# Patient Record
Sex: Male | Born: 1940
Health system: Southern US, Community
[De-identification: ages and names within clinical notes are randomized; demographics above are authoritative.]

## PROBLEM LIST (undated history)

## (undated) DIAGNOSIS — E785 Hyperlipidemia, unspecified: Secondary | ICD-10-CM

## (undated) DIAGNOSIS — E669 Obesity, unspecified: Secondary | ICD-10-CM

## (undated) DIAGNOSIS — I10 Essential (primary) hypertension: Secondary | ICD-10-CM

## (undated) DIAGNOSIS — R001 Bradycardia, unspecified: Secondary | ICD-10-CM

## (undated) DIAGNOSIS — I119 Hypertensive heart disease without heart failure: Secondary | ICD-10-CM

## (undated) DIAGNOSIS — I2510 Atherosclerotic heart disease of native coronary artery without angina pectoris: Secondary | ICD-10-CM

## (undated) HISTORY — PX: CORONARY ARTERY BYPASS GRAFT: SHX141

## (undated) HISTORY — DX: Hyperlipidemia, unspecified: E78.5

## (undated) HISTORY — PX: VASECTOMY: SHX75

## (undated) HISTORY — DX: Essential (primary) hypertension: I10

## (undated) HISTORY — DX: Atherosclerotic heart disease of native coronary artery without angina pectoris: I25.10

## (undated) HISTORY — DX: Bradycardia, unspecified: R00.1

## (undated) HISTORY — DX: Hypertensive heart disease without heart failure: I11.9

## (undated) HISTORY — DX: Obesity, unspecified: E66.9

---

## 2000-10-31 ENCOUNTER — Inpatient Hospital Stay (HOSPITAL_COMMUNITY): Admission: RE | Admit: 2000-10-31 | Discharge: 2000-11-09 | Payer: Self-pay | Admitting: Cardiology

## 2000-11-01 ENCOUNTER — Encounter: Payer: Self-pay | Admitting: Cardiothoracic Surgery

## 2000-11-03 ENCOUNTER — Encounter: Payer: Self-pay | Admitting: Cardiothoracic Surgery

## 2000-11-04 ENCOUNTER — Encounter: Payer: Self-pay | Admitting: Cardiothoracic Surgery

## 2000-11-05 ENCOUNTER — Encounter: Payer: Self-pay | Admitting: Cardiothoracic Surgery

## 2012-11-21 ENCOUNTER — Encounter: Payer: Self-pay | Admitting: *Deleted

## 2012-11-21 ENCOUNTER — Encounter: Payer: Self-pay | Admitting: Cardiology

## 2012-11-26 ENCOUNTER — Ambulatory Visit (INDEPENDENT_AMBULATORY_CARE_PROVIDER_SITE_OTHER): Payer: Medicare Other | Admitting: Cardiology

## 2012-11-26 ENCOUNTER — Encounter: Payer: Self-pay | Admitting: Cardiology

## 2012-11-26 VITALS — BP 120/84 | HR 56 | Ht 69.0 in | Wt 210.0 lb

## 2012-11-26 DIAGNOSIS — I2510 Atherosclerotic heart disease of native coronary artery without angina pectoris: Secondary | ICD-10-CM | POA: Insufficient documentation

## 2012-11-26 DIAGNOSIS — E669 Obesity, unspecified: Secondary | ICD-10-CM

## 2012-11-26 DIAGNOSIS — I10 Essential (primary) hypertension: Secondary | ICD-10-CM

## 2012-11-26 DIAGNOSIS — E785 Hyperlipidemia, unspecified: Secondary | ICD-10-CM | POA: Insufficient documentation

## 2012-11-26 HISTORY — DX: Obesity, unspecified: E66.9

## 2012-11-26 HISTORY — DX: Atherosclerotic heart disease of native coronary artery without angina pectoris: I25.10

## 2012-11-26 LAB — BASIC METABOLIC PANEL
BUN: 16 mg/dL (ref 6–23)
CO2: 31 mEq/L (ref 19–32)
Chloride: 106 mEq/L (ref 96–112)
Creatinine, Ser: 1.3 mg/dL (ref 0.4–1.5)
GFR: 60.03 mL/min (ref >60.00)
Potassium: 4.6 mEq/L (ref 3.5–5.1)

## 2012-11-26 LAB — LIPID PANEL
Cholesterol: 104 mg/dL (ref 0–200)
HDL: 37.7 mg/dL — ABNORMAL LOW (ref >39.00)
LDL Cholesterol: 53 mg/dL (ref 0–99)
Total CHOL/HDL Ratio: 3
Triglycerides: 66 mg/dL (ref 0.0–149.0)
VLDL: 13.2 mg/dL (ref 0.0–40.0)

## 2012-11-26 MED ORDER — HYDROCHLOROTHIAZIDE 25 MG PO TABS: 25.0000 mg | ORAL_TABLET | Freq: Every day | ORAL | Status: DC

## 2012-11-26 MED ORDER — METOPROLOL SUCCINATE ER 50 MG PO TB24: 50.0000 mg | ORAL_TABLET | Freq: Every day | ORAL | Status: DC

## 2012-11-26 MED ORDER — AMLODIPINE BESYLATE 5 MG PO TABS: 5.0000 mg | ORAL_TABLET | Freq: Every day | ORAL | Status: DC

## 2012-11-26 MED ORDER — ROSUVASTATIN CALCIUM 20 MG PO TABS: 20.0000 mg | ORAL_TABLET | Freq: Every day | ORAL | Status: DC

## 2012-11-26 NOTE — Progress Notes (Addendum)
  [address] [place], [place]  [address] Phone: [phone] Fax:  [phone]  Date:  11/26/2012   ID:  Jeffery Barr, DOB Jun 26, 1940, MRN [id number]  PCP:  No primary provider on file.  Cardiologist:  Armanda Magic, MD    History of Present Illness: Jeffery Barr is a 72 y.o. male who presents today for followup of his CAD/HTN and lipids.  He is doing well.  Since I saw him last he fell off a ladder and fractured some ribs.  He denies any chest pain, SOB, DOE, LE edema, dizziness, palpitations or syncope.   Wt Readings from Last 3 Encounters:  11/26/12 210 lb (95.255 kg)     Past Medical History  Diagnosis Date  . Hypercholesteremia   . HTN (hypertension)   . Benign hypertensive heart disease without heart failure   . Obesity   . Dyslipidemia   . Coronary atherosclerosis of native coronary artery     S/P CABG    Current Outpatient Prescriptions  Medication Sig Dispense Refill  . amLODipine (NORVASC) 5 MG tablet Take 5 mg by mouth daily.      Marland Kitchen aspirin 81 MG tablet Take 81 mg by mouth daily.      . hydrochlorothiazide (HYDRODIURIL) 25 MG tablet Take 25 mg by mouth daily.      . metoprolol succinate (TOPROL-XL) 50 MG 24 hr tablet Take 50 mg by mouth daily. Take with or immediately following a meal.      . rosuvastatin (CRESTOR) 40 MG tablet Take 1/2 of 40mg  tablet  by mouth daily.       No current facility-administered medications for this visit.    Allergies:   No Known Allergies  Social History:  The patient  reports that he has quit smoking. He does not have any smokeless tobacco history on file. He reports that he drinks about 0.6 ounces of alcohol per week. He reports that he does not use illicit drugs.   Family History:  The patient's family history includes CAD in an other family member; Heart attack in an other family member; Heart disease in his brother; Heart failure in his mother.   ROS:  Please see the history of present illness.      All  other systems reviewed and negative.   PHYSICAL EXAM: VS:  BP 120/84  Pulse 56  Ht 5\' 9"  (1.753 m)  Wt 210 lb (95.255 kg)  BMI 31 kg/m2 Well nourished, well developed, in no acute distress HEENT: normal Neck: no JVD Cardiac:  normal S1, S2; RRR; no murmur Lungs:  clear to auscultation bilaterally, no wheezing, rhonchi or rales Abd: soft, nontender, no hepatomegaly Ext: no edema Skin: warm and dry Neuro:  CNs 2-12 intact, no focal abnormalities noted  EKG:  NSR with nonspecific ST abnormality no change compared to EKG 2012     ASSESSMENT AND PLAN:  1. CAD with no angina  -  continue ASA/beta blocker 2.  Dyslipidemia  - continue Crestor  - check lipids/ALT today 3.  HTN - controlled  - continue Toprol/HCTZ/Amlodipine  - check BMET today  Followup with me in 1 year igned, Armanda Magic, MD 11/26/2012 9:38 AM

## 2012-11-26 NOTE — Patient Instructions (Signed)
Your physician recommends that you continue on your current medications as directed. Please refer to the Current Medication list given to you today.  Your physician recommends that you schedule a follow-up appointment in: One year for F/U with Dr. Mayford Knife.  Please go to the lab today for Lipid, ALT, and BMET

## 2013-03-01 ENCOUNTER — Other Ambulatory Visit: Payer: Self-pay | Admitting: Cardiology

## 2013-03-03 ENCOUNTER — Other Ambulatory Visit: Payer: Self-pay | Admitting: *Deleted

## 2013-03-03 MED ORDER — ROSUVASTATIN CALCIUM 20 MG PO TABS
20.0000 mg | ORAL_TABLET | Freq: Every day | ORAL | Status: DC
Start: 2013-03-03 — End: 2013-11-22

## 2013-08-16 ENCOUNTER — Encounter: Payer: Self-pay | Admitting: *Deleted

## 2013-08-25 ENCOUNTER — Other Ambulatory Visit: Payer: Self-pay | Admitting: Cardiology

## 2013-08-30 ENCOUNTER — Other Ambulatory Visit: Payer: Self-pay | Admitting: Cardiology

## 2013-11-22 ENCOUNTER — Ambulatory Visit (INDEPENDENT_AMBULATORY_CARE_PROVIDER_SITE_OTHER): Payer: Medicare Other | Admitting: Cardiology

## 2013-11-22 ENCOUNTER — Encounter: Payer: Self-pay | Admitting: Cardiology

## 2013-11-22 ENCOUNTER — Telehealth: Payer: Self-pay | Admitting: Pharmacist

## 2013-11-22 VITALS — BP 138/70 | HR 56 | Ht 69.0 in | Wt 215.0 lb

## 2013-11-22 DIAGNOSIS — I2510 Atherosclerotic heart disease of native coronary artery without angina pectoris: Secondary | ICD-10-CM | POA: Diagnosis not present

## 2013-11-22 DIAGNOSIS — E785 Hyperlipidemia, unspecified: Secondary | ICD-10-CM | POA: Diagnosis not present

## 2013-11-22 DIAGNOSIS — I10 Essential (primary) hypertension: Secondary | ICD-10-CM | POA: Diagnosis not present

## 2013-11-22 LAB — BASIC METABOLIC PANEL
BUN: 19 mg/dL (ref 6–23)
CO2: 27 mEq/L (ref 19–32)
Calcium: 9.2 mg/dL (ref 8.4–10.5)
Chloride: 103 mEq/L (ref 96–112)
Creatinine, Ser: 1.1 mg/dL (ref 0.4–1.5)
GFR: 67.19 mL/min (ref >60.00)
Glucose, Bld: 86 mg/dL (ref 70–99)
Potassium: 3.8 mEq/L (ref 3.5–5.1)
SODIUM: 137 meq/L (ref 135–145)

## 2013-11-22 LAB — LIPID PANEL
Cholesterol: 226 mg/dL — ABNORMAL HIGH (ref 0–200)
HDL: 42.4 mg/dL (ref >39.00)
LDL Cholesterol: 157 mg/dL — ABNORMAL HIGH (ref 0–99)
NonHDL: 183.6
TRIGLYCERIDES: 135 mg/dL (ref 0.0–149.0)
Total CHOL/HDL Ratio: 5
VLDL: 27 mg/dL (ref 0.0–40.0)

## 2013-11-22 LAB — ALT: ALT: 22 U/L (ref 0–53)

## 2013-11-22 MED ORDER — HYDROCHLOROTHIAZIDE 25 MG PO TABS: 25.0000 mg | ORAL_TABLET | Freq: Every day | ORAL | Status: DC

## 2013-11-22 MED ORDER — ROSUVASTATIN CALCIUM 20 MG PO TABS: 20.0000 mg | ORAL_TABLET | Freq: Every day | ORAL | Status: DC

## 2013-11-22 MED ORDER — METOPROLOL SUCCINATE ER 50 MG PO TB24: 50.0000 mg | ORAL_TABLET | Freq: Every day | ORAL | Status: DC

## 2013-11-22 MED ORDER — AMLODIPINE BESYLATE 5 MG PO TABS: ORAL_TABLET | ORAL | Status: DC

## 2013-11-22 NOTE — Telephone Encounter (Signed)
Spoke with pt's wife.  Had given her instructions to increase pt's Crestor to 40mg  once daily.  After talking with him, he let her know he had been out of his crestor for several days prior to getting his labs checked.  Will have him restart with just 20mg  daily as his LDL was <70 on this dose last year.  Will plan to recheck in 3 months to verify dose.

## 2013-11-22 NOTE — Patient Instructions (Signed)
Your physician recommends that you return for a FASTING lipid, alt and bmet today.  Your physician recommends that you continue on your current medications as directed. Please refer to the Current Medication list given to you today.  Your physician wants you to follow-up in: 1 year with Dr. Mayford Knifeurner. You will receive a reminder letter in the mail two months in advance. If you don't receive a letter, please call our office to schedule the follow-up appointment.

## 2013-11-22 NOTE — Progress Notes (Signed)
  [address] [place], [place]  [address] Phone: [phone] Fax:  [phone]  Date:  11/22/2013   ID:  Jeffery Barr, DOB 04/07/1940, MRN [id number]  PCP:  No primary provider on file.  Cardiologist:  Armanda Magicraci Lunah Losasso, MD    History of Present Illness: Jeffery PaymentDavid T Sanko is a 73 y.o. male who presents today for followup of his CAD/HTN and lipids. He is doing well.  He denies any chest pain, SOB, DOE, LE edema, dizziness, palpitations or syncope.  He joined a gym and works out when he can.   Wt Readings from Last 3 Encounters:  11/22/13 215 lb (97.523 kg)  11/26/12 210 lb (95.255 kg)     Past Medical History  Diagnosis Date  . Hypercholesteremia   . HTN (hypertension)   . Benign hypertensive heart disease without heart failure   . Obesity   . Dyslipidemia   . Coronary atherosclerosis of native coronary artery     S/P CABG    Current Outpatient Prescriptions  Medication Sig Dispense Refill  . amLODipine (NORVASC) 5 MG tablet TAKE 1 TABLET BY MOUTH EVERY DAY  90 tablet  0  . aspirin 81 MG tablet Take 81 mg by mouth daily.      . hydrochlorothiazide (HYDRODIURIL) 25 MG tablet Take 1 tablet (25 mg total) by mouth daily.  90 tablet  3  . metoprolol succinate (TOPROL-XL) 50 MG 24 hr tablet Take 1 tablet (50 mg total) by mouth daily. Take with or immediately following a meal.  90 tablet  3  . rosuvastatin (CRESTOR) 20 MG tablet Take 1 tablet (20 mg total) by mouth daily.  90 tablet  3   No current facility-administered medications for this visit.    Allergies:   No Known Allergies  Social History:  The patient  reports that he has quit smoking. He does not have any smokeless tobacco history on file. He reports that he drinks about .6 ounces of alcohol per week. He reports that he does not use illicit drugs.   Family History:  The patient's family history includes CAD in his mother, sister, and another family member; Heart attack in an other family member; Heart disease  in his brother; Heart failure in his mother.   ROS:  Please see the history of present illness.      All other systems reviewed and negative.   PHYSICAL EXAM: VS:  BP 138/70  Pulse 56  Ht 5\' 9"  (1.753 m)  Wt 215 lb (97.523 kg)  BMI 31.74 kg/m2 Well nourished, well developed, in no acute distress HEENT: normal Neck: no JVD Cardiac:  normal S1, S2; RRR; no murmur Lungs:  clear to auscultation bilaterally, no wheezing, rhonchi or rales Abd: soft, nontender, no hepatomegaly Ext: no edema Skin: warm and dry Neuro:  CNs 2-12 intact, no focal abnormalities noted  EKG:     Sinus bradycardia at 56bpm with no ST changes  ASSESSMENT AND PLAN:  1.  CAD with no angina - continue ASA/beta blocker  2. Dyslipidemia  - continue Crestor  - check lipids/ALT today  3. HTN - controlled  - continue Toprol/HCTZ/Amlodipine  - check BMET today   Followup with me in 1 year    Signed, Armanda Magicraci Nyisha Clippard, MD [facility] 11/22/2013 10:37 AM

## 2013-11-23 MED ORDER — ROSUVASTATIN CALCIUM 20 MG PO TABS: 40.0000 mg | ORAL_TABLET | Freq: Every day | ORAL | Status: DC

## 2013-11-23 NOTE — Addendum Note (Signed)
Addended byOrlene Plum: Adam Sanjuan H on: 11/23/2013 09:58 AM   Modules accepted: Orders

## 2013-11-26 ENCOUNTER — Ambulatory Visit: Payer: Medicare Other | Admitting: Cardiology

## 2013-11-29 ENCOUNTER — Other Ambulatory Visit: Payer: Self-pay

## 2013-11-29 MED ORDER — ROSUVASTATIN CALCIUM 20 MG PO TABS
40.0000 mg | ORAL_TABLET | Freq: Every day | ORAL | Status: DC
Start: 2013-11-29 — End: 2014-11-30

## 2014-09-21 ENCOUNTER — Encounter: Payer: Self-pay | Admitting: Cardiology

## 2014-10-20 ENCOUNTER — Encounter: Payer: Self-pay | Admitting: Cardiology

## 2014-11-23 ENCOUNTER — Other Ambulatory Visit: Payer: Medicare Other

## 2014-11-23 ENCOUNTER — Ambulatory Visit: Payer: Medicare Other | Admitting: Cardiology

## 2014-11-30 ENCOUNTER — Telehealth: Payer: Self-pay

## 2014-11-30 ENCOUNTER — Ambulatory Visit (INDEPENDENT_AMBULATORY_CARE_PROVIDER_SITE_OTHER): Payer: Medicare Other | Admitting: Cardiology

## 2014-11-30 ENCOUNTER — Encounter: Payer: Self-pay | Admitting: Cardiology

## 2014-11-30 DIAGNOSIS — I10 Essential (primary) hypertension: Secondary | ICD-10-CM

## 2014-11-30 DIAGNOSIS — E785 Hyperlipidemia, unspecified: Secondary | ICD-10-CM

## 2014-11-30 DIAGNOSIS — I2510 Atherosclerotic heart disease of native coronary artery without angina pectoris: Secondary | ICD-10-CM | POA: Diagnosis not present

## 2014-11-30 DIAGNOSIS — I2583 Coronary atherosclerosis due to lipid rich plaque: Secondary | ICD-10-CM

## 2014-11-30 LAB — HEPATIC FUNCTION PANEL
ALBUMIN: 4.2 g/dL (ref 3.6–5.1)
ALK PHOS: 51 U/L (ref 40–115)
ALT: 25 U/L (ref 9–46)
AST: 23 U/L (ref 10–35)
BILIRUBIN TOTAL: 0.6 mg/dL (ref 0.2–1.2)
Bilirubin, Direct: 0.2 mg/dL (ref <=0.2)
Indirect Bilirubin: 0.4 mg/dL (ref 0.2–1.2)
TOTAL PROTEIN: 7.3 g/dL (ref 6.1–8.1)

## 2014-11-30 LAB — BASIC METABOLIC PANEL
BUN: 18 mg/dL (ref 7–25)
CHLORIDE: 103 mmol/L (ref 98–110)
CO2: 30 mmol/L (ref 20–31)
Calcium: 10 mg/dL (ref 8.6–10.3)
Creat: 1.21 mg/dL — ABNORMAL HIGH (ref 0.70–1.18)
GLUCOSE: 95 mg/dL (ref 65–99)
POTASSIUM: 4.3 mmol/L (ref 3.5–5.3)
SODIUM: 133 mmol/L — AB (ref 135–146)

## 2014-11-30 LAB — LIPID PANEL
CHOL/HDL RATIO: 3.6 ratio (ref <=5.0)
Cholesterol: 114 mg/dL — ABNORMAL LOW (ref 125–200)
HDL: 32 mg/dL — ABNORMAL LOW (ref >=40)
LDL Cholesterol: 64 mg/dL (ref <130)
Triglycerides: 91 mg/dL (ref <150)
VLDL: 18 mg/dL (ref <30)

## 2014-11-30 MED ORDER — HYDROCHLOROTHIAZIDE 12.5 MG PO TABS: 12.5000 mg | ORAL_TABLET | Freq: Every day | ORAL | Status: DC

## 2014-11-30 MED ORDER — HYDROCHLOROTHIAZIDE 25 MG PO TABS: 25.0000 mg | ORAL_TABLET | Freq: Every day | ORAL | Status: DC

## 2014-11-30 MED ORDER — AMLODIPINE BESYLATE 5 MG PO TABS: 5.0000 mg | ORAL_TABLET | Freq: Every day | ORAL | Status: DC

## 2014-11-30 MED ORDER — METOPROLOL SUCCINATE ER 50 MG PO TB24: 50.0000 mg | ORAL_TABLET | Freq: Every day | ORAL | Status: DC

## 2014-11-30 MED ORDER — ROSUVASTATIN CALCIUM 40 MG PO TABS: 40.0000 mg | ORAL_TABLET | Freq: Every day | ORAL | Status: DC

## 2014-11-30 NOTE — Patient Instructions (Signed)
Medication Instructions:  Your physician recommends that you continue on your current medications as directed. Please refer to the Current Medication list given to you today.   Labwork: TODAY: BMET, LFTs, Lipids  Testing/Procedures: None  Follow-Up: Your physician wants you to follow-up in: 1 year with Dr. Turner. You will receive a reminder letter in the mail two months in advance. If you don't receive a letter, please call our office to schedule the follow-up appointment.   Any Other Special Instructions Will Be Listed Below (If Applicable).   

## 2014-11-30 NOTE — Telephone Encounter (Signed)
Informed patient of results and verbal understanding expressed.  Instructed patient to DECREASE HCTZ to 12.5 mg daily. Repeat BMET scheduled next Tuesday, October 18. Patient and wife agree with treatment plan.

## 2014-11-30 NOTE — Progress Notes (Signed)
Cardiology Office Note   Date:  11/30/2014   ID:  Jeffery Barr, DOB 05/01/1940, MRN [id number]  PCP:  Lenora BoysFRIED, ROBERT L, MD    Chief Complaint  Patient presents with  . Coronary Artery Disease  . Hypertension      History of Present Illness: Jeffery Barr is a 74 y.o. male who presents today for followup of his CAD/HTN and lipids. He is doing well. He denies any chest pain, SOB, DOE, LE edema, dizziness, palpitations or syncope. He works out at [employer] when he can.     Past Medical History  Diagnosis Date  . Hypercholesteremia   . HTN (hypertension)   . Benign hypertensive heart disease without heart failure   . Obesity   . Dyslipidemia   . Coronary atherosclerosis of native coronary artery     S/P CABG    Past Surgical History  Procedure Laterality Date  . Vasectomy    . Coronary artery bypass graft       Current Outpatient Prescriptions  Medication Sig Dispense Refill  . amLODipine (NORVASC) 5 MG tablet TAKE 1 TABLET BY MOUTH EVERY DAY 90 tablet 3  . aspirin 81 MG tablet Take 81 mg by mouth daily.    . hydrochlorothiazide (HYDRODIURIL) 25 MG tablet Take 1 tablet (25 mg total) by mouth daily. 90 tablet 3  . metoprolol succinate (TOPROL-XL) 50 MG 24 hr tablet Take 1 tablet (50 mg total) by mouth daily. Take with or immediately following a meal. 90 tablet 3  . rosuvastatin (CRESTOR) 20 MG tablet Take 2 tablets (40 mg total) by mouth daily. 90 tablet 3   No current facility-administered medications for this visit.    Allergies:   Review of patient's allergies indicates no known allergies.    Social History:  The patient  reports that he has quit smoking. He does not have any smokeless tobacco history on file. He reports that he drinks about 0.6 oz of alcohol per week. He reports that he does not use illicit drugs.   Family History:  The patient's family history includes CAD in his mother, sister, and another family member; Heart attack in  an other family member; Heart disease in his brother; Heart failure in his mother.    ROS:  Please see the history of present illness.   Otherwise, review of systems are positive for none.   All other systems are reviewed and negative.    PHYSICAL EXAM: VS:  BP 144/62 mmHg  Pulse 52  Ht 5\' 9"  (1.753 m)  Wt 207 lb 12.8 oz (94.257 kg)  BMI 30.67 kg/m2 , BMI Body mass index is 30.67 kg/(m^2). GEN: Well nourished, well developed, in no acute distress HEENT: normal Neck: no JVD, carotid bruits, or masses Cardiac: RRR; no murmurs, rubs, or gallops,no edema  Respiratory:  clear to auscultation bilaterally, normal work of breathing GI: soft, nontender, nondistended, + BS MS: no deformity or atrophy Skin: warm and dry, no rash Neuro:  Strength and sensation are intact Psych: euthymic mood, full affect   EKG:  EKG is ordered today. The ekg ordered today demonstrates Sinus bradycardia at 52bpm with nonspecific ST abnormality   Recent Labs: No results found for requested labs within last 365 days.    Lipid Panel    Component Value Date/Time   CHOL 226* 11/22/2013 1053   TRIG 135.0 11/22/2013 1053   HDL 42.40  11/22/2013 1053   CHOLHDL 5 11/22/2013 1053   VLDL 27.0 11/22/2013 1053   LDLCALC 157* 11/22/2013 1053      Wt Readings from Last 3 Encounters:  11/30/14 207 lb 12.8 oz (94.257 kg)  11/22/13 215 lb (97.523 kg)  11/26/12 210 lb (95.255 kg)      ASSESSMENT AND PLAN:  1. CAD with no angina - continue ASA/beta blocker  2. Dyslipidemia  - continue Crestor  - check lipids/ALT today  3. HTN - controlled  - continue Toprol/HCTZ/Amlodipine  - check BMET today     Current medicines are reviewed at length with the patient today.  The patient does not have concerns regarding medicines.  The following changes have been made:  no change  Labs/ tests ordered today: See above Assessment and Plan No orders of the defined types were placed in this encounter.      Disposition:   FU with me in 1 year  Signed, Quintella Reichert, MD  11/30/2014 8:59 AM    [facility] [address] [place], [place], [place]  [address] Phone: [phone]; Fax: [phone]

## 2014-11-30 NOTE — Telephone Encounter (Signed)
-----   Message from Quintella Reichertraci R Turner, MD sent at 11/30/2014  1:50 PM EDT ----- Lipids at goal.  Creatinine bumped and sodium slightly low.  Decrease HCTZ to 12.5mg  daily and recheck BMET in 1 week

## 2014-12-06 ENCOUNTER — Other Ambulatory Visit: Payer: Medicare Other

## 2015-04-13 DIAGNOSIS — M7022 Olecranon bursitis, left elbow: Secondary | ICD-10-CM | POA: Diagnosis not present

## 2015-11-19 ENCOUNTER — Other Ambulatory Visit: Payer: Self-pay | Admitting: Cardiology

## 2015-12-04 ENCOUNTER — Ambulatory Visit (INDEPENDENT_AMBULATORY_CARE_PROVIDER_SITE_OTHER): Payer: Medicare Other | Admitting: Cardiology

## 2015-12-04 ENCOUNTER — Encounter: Payer: Self-pay | Admitting: Cardiology

## 2015-12-04 VITALS — BP 140/82 | HR 55 | Ht 68.5 in | Wt 218.4 lb

## 2015-12-04 DIAGNOSIS — E785 Hyperlipidemia, unspecified: Secondary | ICD-10-CM

## 2015-12-04 DIAGNOSIS — I2510 Atherosclerotic heart disease of native coronary artery without angina pectoris: Secondary | ICD-10-CM

## 2015-12-04 DIAGNOSIS — I10 Essential (primary) hypertension: Secondary | ICD-10-CM

## 2015-12-04 LAB — HEPATIC FUNCTION PANEL
ALBUMIN: 4 g/dL (ref 3.6–5.1)
ALT: 21 U/L (ref 9–46)
AST: 20 U/L (ref 10–35)
Alkaline Phosphatase: 45 U/L (ref 40–115)
BILIRUBIN TOTAL: 0.6 mg/dL (ref 0.2–1.2)
Bilirubin, Direct: 0.2 mg/dL (ref <=0.2)
Indirect Bilirubin: 0.4 mg/dL (ref 0.2–1.2)
TOTAL PROTEIN: 6.5 g/dL (ref 6.1–8.1)

## 2015-12-04 LAB — LIPID PANEL
CHOL/HDL RATIO: 2.6 ratio (ref <=5.0)
CHOLESTEROL: 104 mg/dL — AB (ref 125–200)
HDL: 40 mg/dL (ref >=40)
LDL Cholesterol: 40 mg/dL (ref <130)
Triglycerides: 118 mg/dL (ref <150)
VLDL: 24 mg/dL (ref <30)

## 2015-12-04 LAB — BASIC METABOLIC PANEL
BUN: 17 mg/dL (ref 7–25)
CALCIUM: 9.1 mg/dL (ref 8.6–10.3)
CO2: 28 mmol/L (ref 20–31)
Chloride: 106 mmol/L (ref 98–110)
Creat: 1.16 mg/dL (ref 0.70–1.18)
GLUCOSE: 96 mg/dL (ref 65–99)
POTASSIUM: 4 mmol/L (ref 3.5–5.3)
Sodium: 143 mmol/L (ref 135–146)

## 2015-12-04 MED ORDER — AMLODIPINE BESYLATE 5 MG PO TABS: 5.0000 mg | ORAL_TABLET | Freq: Every day | ORAL | 3 refills | Status: DC

## 2015-12-04 MED ORDER — HYDROCHLOROTHIAZIDE 25 MG PO TABS: 25.0000 mg | ORAL_TABLET | Freq: Every day | ORAL | 3 refills | Status: DC

## 2015-12-04 MED ORDER — ROSUVASTATIN CALCIUM 40 MG PO TABS: 40.0000 mg | ORAL_TABLET | Freq: Every day | ORAL | 3 refills | Status: DC

## 2015-12-04 MED ORDER — METOPROLOL SUCCINATE ER 50 MG PO TB24: 50.0000 mg | ORAL_TABLET | Freq: Every day | ORAL | 3 refills | Status: DC

## 2015-12-04 NOTE — Progress Notes (Signed)
Cardiology Office Note    Date:  12/04/2015   ID:  Jeffery Barr, DOB Jun 08, 1940, MRN [id number]  PCP:  Lenora Boys, MD  Cardiologist:  Armanda Magic, MD   Chief Complaint  Patient presents with  . Coronary Artery Disease  . Hypertension  . Hyperlipidemia    History of Present Illness:  Jeffery Barr is a 75 y.o. male who presents today for followup of his CAD/HTN and lipids. He is doing well. He denies any chest pain, SOB, DOE, LE edema, dizziness, palpitations or syncope. He works out at [employer] when he can.    Past Medical History:  Diagnosis Date  . Benign hypertensive heart disease without heart failure   . Coronary atherosclerosis of native coronary artery    S/P CABG  . Dyslipidemia   . HTN (hypertension)   . Obesity     Past Surgical History:  Procedure Laterality Date  . CORONARY ARTERY BYPASS GRAFT    . VASECTOMY      Current Medications: Outpatient Medications Prior to Visit  Medication Sig Dispense Refill  . amLODipine (NORVASC) 5 MG tablet TAKE 1 TABLET (5 MG TOTAL) BY MOUTH DAILY. 90 tablet 0  . aspirin 81 MG tablet Take 81 mg by mouth daily.    . metoprolol succinate (TOPROL-XL) 50 MG 24 hr tablet TAKE 1 TABLET (50 MG TOTAL) BY MOUTH DAILY. TAKE WITH OR IMMEDIATELY FOLLOWING A MEAL. 90 tablet 0  . rosuvastatin (CRESTOR) 40 MG tablet TAKE 1 TABLET (40 MG TOTAL) BY MOUTH DAILY. 90 tablet 0  . hydrochlorothiazide (HYDRODIURIL) 12.5 MG tablet Take 1 tablet (12.5 mg total) by mouth daily. 90 tablet 3   No facility-administered medications prior to visit.      Allergies:   Review of patient's allergies indicates no known allergies.   Social History   Social History  . Marital status: Married    Spouse name: N/A  . Number of children: N/A  . Years of education: N/A   Social History Main Topics  . Smoking status: Former Games developer  . Smokeless tobacco: None  . Alcohol use 0.6 oz/week    1 - 2 Glasses of wine, 1 - 2 Cans of beer, 1 Shots of  liquor per week     Comment: 1-2 glasses of wine and beer weekly  . Drug use: No  . Sexual activity: Not Asked   Other Topics Concern  . None   Social History Narrative  . None     Family History:  The patient's family history includes CAD in his mother and sister; Heart disease in his brother; Heart failure in his mother.   ROS:   Please see the history of present illness.    ROS All other systems reviewed and are negative.  No flowsheet data found.     PHYSICAL EXAM:   VS:  BP 140/82   Pulse (!) 55   Ht 5' 8.5" (1.74 m)   Wt 218 lb 6.4 oz (99.1 kg)   BMI 32.72 kg/m    GEN: Well nourished, well developed, in no acute distress  HEENT: normal  Neck: no JVD, carotid bruits, or masses Cardiac: RRR; no murmurs, rubs, or gallops,no edema.  Intact distal pulses bilaterally.  Respiratory:  clear to auscultation bilaterally, normal work of breathing GI: soft, nontender, nondistended, + BS MS: no deformity or atrophy  Skin: warm and dry, no rash Neuro:  Alert and Oriented x 3, Strength and sensation are intact Psych: euthymic  mood, full affect  Wt Readings from Last 3 Encounters:  12/04/15 218 lb 6.4 oz (99.1 kg)  11/30/14 207 lb 12.8 oz (94.3 kg)  11/22/13 215 lb (97.5 kg)      Studies/Labs Reviewed:   EKG:  EKG is ordered today.  The ekg ordered today demonstrates sinus bradycardia at 54bpm with nonspecific ST abnormality  Recent Labs: No results found for requested labs within last 8760 hours.   Lipid Panel    Component Value Date/Time   CHOL 114 (L) 11/30/2014 0920   TRIG 91 11/30/2014 0920   HDL 32 (L) 11/30/2014 0920   CHOLHDL 3.6 11/30/2014 0920   VLDL 18 11/30/2014 0920   LDLCALC 64 11/30/2014 0920    Additional studies/ records that were reviewed today include:  none    ASSESSMENT:    1. Coronary artery disease involving native coronary artery of native heart without angina pectoris   2. Essential hypertension   3. Dyslipidemia      PLAN:   In order of problems listed above:  1. ASCAD with no anginal symptoms.  Continue ASA and statin.  2. HTN - BP controlled on current meds.  Continue amlodipine/diuretic and BB. 3. Hyperlipidemia with LDL goal < 70.  Continue statin.  Check FLP and ALT.     Medication Adjustments/Labs and Tests Ordered: Current medicines are reviewed at length with the patient today.  Concerns regarding medicines are outlined above.  Medication changes, Labs and Tests ordered today are listed in the Patient Instructions below.  There are no Patient Instructions on file for this visit.   Signed, Armanda Magicraci Wally Behan, MD  12/04/2015 10:08 AM    [facility] [address], [place], [place]  [address] Phone: [phone]([phone]; Fax: [phone]

## 2015-12-04 NOTE — Patient Instructions (Signed)

## 2015-12-06 ENCOUNTER — Telehealth: Payer: Self-pay | Admitting: Cardiology

## 2015-12-06 NOTE — Telephone Encounter (Signed)
New message   Pt verbalized that he is returning call for rn about his wife and lab results

## 2015-12-06 NOTE — Telephone Encounter (Signed)
Left message to call back  

## 2015-12-06 NOTE — Telephone Encounter (Signed)
Patient called in asking if his wife could pick up his medical records I made him aware she can as long as he signs a release in order for her to so.  He then asked me id this was the law I made him aware per [facility] and [facility] this was a requirement. He stated we have been releasing records against the law for 15 years. I apologized to patient numerous times and told him this has not been happening here since [facility] joined this practice. We will always have a signed release.   He then told me to ask [facility]Katy and Dr.Turner if they would release these records-[facility] and [facility] and she stated No release needs to be signed.   Called patient back made him aware I can E-mail,fax,mail or he can come to office and sign release but until it's signed I cannot release information to his wife.   I emailed release to [email or web address]

## 2015-12-11 NOTE — Telephone Encounter (Signed)
Notes Recorded by Henrietta DineKathryn A Kemp, RN on 12/07/2015 at 5:38 PM EDT Informed patient of results and verbal understanding expressed.  Also spoke with patient's wife and she understands results as well.  Patient's wife reports she sent DPR form via email to medical records today. Explained to her how to get MyChart so she can see all of the patient's results for her keeping.

## 2016-11-25 ENCOUNTER — Encounter: Payer: Self-pay | Admitting: Cardiology

## 2016-12-09 ENCOUNTER — Encounter: Payer: Self-pay | Admitting: Cardiology

## 2016-12-09 ENCOUNTER — Ambulatory Visit (INDEPENDENT_AMBULATORY_CARE_PROVIDER_SITE_OTHER): Payer: Medicare Other | Admitting: Cardiology

## 2016-12-09 VITALS — BP 138/70 | HR 49 | Ht 68.5 in | Wt 216.0 lb

## 2016-12-09 DIAGNOSIS — E785 Hyperlipidemia, unspecified: Secondary | ICD-10-CM | POA: Diagnosis not present

## 2016-12-09 DIAGNOSIS — I10 Essential (primary) hypertension: Secondary | ICD-10-CM | POA: Diagnosis not present

## 2016-12-09 DIAGNOSIS — I2510 Atherosclerotic heart disease of native coronary artery without angina pectoris: Secondary | ICD-10-CM | POA: Diagnosis not present

## 2016-12-09 LAB — HEPATIC FUNCTION PANEL
ALBUMIN: 4.6 g/dL (ref 3.5–4.8)
ALK PHOS: 56 IU/L (ref 39–117)
ALT: 19 IU/L (ref 0–44)
AST: 21 IU/L (ref 0–40)
Bilirubin Total: 0.5 mg/dL (ref 0.0–1.2)
Bilirubin, Direct: 0.19 mg/dL (ref 0.00–0.40)
TOTAL PROTEIN: 7.2 g/dL (ref 6.0–8.5)

## 2016-12-09 LAB — BASIC METABOLIC PANEL
BUN / CREAT RATIO: 12 (ref 10–24)
BUN: 15 mg/dL (ref 8–27)
CHLORIDE: 103 mmol/L (ref 96–106)
CO2: 24 mmol/L (ref 20–29)
Calcium: 9.9 mg/dL (ref 8.6–10.2)
Creatinine, Ser: 1.21 mg/dL (ref 0.76–1.27)
GFR calc Af Amer: 68 mL/min/{1.73_m2} (ref >59)
GFR calc non Af Amer: 59 mL/min/{1.73_m2} — ABNORMAL LOW (ref >59)
GLUCOSE: 95 mg/dL (ref 65–99)
POTASSIUM: 4.8 mmol/L (ref 3.5–5.2)
SODIUM: 144 mmol/L (ref 134–144)

## 2016-12-09 LAB — LIPID PANEL
CHOL/HDL RATIO: 2.5 ratio (ref 0.0–5.0)
Cholesterol, Total: 106 mg/dL (ref 100–199)
HDL: 42 mg/dL (ref >39)
LDL Calculated: 39 mg/dL (ref 0–99)
Triglycerides: 123 mg/dL (ref 0–149)
VLDL CHOLESTEROL CAL: 25 mg/dL (ref 5–40)

## 2016-12-09 MED ORDER — HYDROCHLOROTHIAZIDE 25 MG PO TABS: 25.0000 mg | ORAL_TABLET | Freq: Every day | ORAL | 3 refills | Status: DC

## 2016-12-09 MED ORDER — AMLODIPINE BESYLATE 5 MG PO TABS: 5.0000 mg | ORAL_TABLET | Freq: Every day | ORAL | 3 refills | Status: DC

## 2016-12-09 MED ORDER — ROSUVASTATIN CALCIUM 40 MG PO TABS: 40.0000 mg | ORAL_TABLET | Freq: Every day | ORAL | 3 refills | Status: DC

## 2016-12-09 MED ORDER — METOPROLOL SUCCINATE ER 50 MG PO TB24: 50.0000 mg | ORAL_TABLET | Freq: Every day | ORAL | 3 refills | Status: DC

## 2016-12-09 NOTE — Patient Instructions (Signed)
Medication Instructions:  Your physician recommends that you continue on your current medications as directed. Please refer to the Current Medication list given to you today.   Labwork: Basic metabolic panel, fasting lipids, and liver function panel today   Testing/Procedures: None ordered   Follow-Up: Your physician wants you to follow-up in: 1 year with Dr. Sherlyn Lickurner  You will receive a reminder letter in the mail two months in advance. If you don't receive a letter, please call our office to schedule the follow-up appointment.   Any Other Special Instructions Will Be Listed Below (If Applicable).     If you need a refill on your cardiac medications before your next appointment, please call your pharmacy.

## 2016-12-09 NOTE — Progress Notes (Signed)
Cardiology Office Note:    Date:  12/09/2016   ID:  Jeffery Barr, DOB 03-18-1940, MRN [id number]  PCP:  [name], MD  Cardiologist:  Armanda Magic, MD   Referring MD: Marinda Elk, MD   Chief Complaint  Patient presents with  . Coronary Artery Disease  . Hypertension  . Hyperlipidemia    History of Present Illness:    Jeffery Barr is a 76 y.o. male with a hx of ASCAD s/p remote CABG, HTN and hyperlipidemia.  He is here today for followup and is doing well.  He denies any chest pain or pressure, SOB, DOE, PND, orthopnea, LE edema, dizziness, palpitations or syncope. He is compliant with his meds and is tolerating meds with no SE.    Past Medical History:  Diagnosis Date  . Benign hypertensive heart disease without heart failure   . Coronary atherosclerosis of native coronary artery    S/P CABG  . Dyslipidemia   . HTN (hypertension)   . Obesity     Past Surgical History:  Procedure Laterality Date  . CORONARY ARTERY BYPASS GRAFT    . VASECTOMY      Current Medications: Current Meds  Medication Sig  . amLODipine (NORVASC) 5 MG tablet Take 1 tablet (5 mg total) by mouth daily.  Marland Kitchen aspirin 81 MG tablet Take 81 mg by mouth daily.  . hydrochlorothiazide (HYDRODIURIL) 25 MG tablet Take 1 tablet (25 mg total) by mouth daily.  . metoprolol succinate (TOPROL-XL) 50 MG 24 hr tablet Take 1 tablet (50 mg total) by mouth daily. Take with or immediately following a meal.  . rosuvastatin (CRESTOR) 40 MG tablet Take 1 tablet (40 mg total) by mouth daily.     Allergies:   Patient has no known allergies.   Social History   Social History  . Marital status: Married    Spouse name: N/A  . Number of children: N/A  . Years of education: N/A   Social History Main Topics  . Smoking status: Former Games developer  . Smokeless tobacco: Never Used  . Alcohol use 0.6 oz/week    1 - 2 Glasses of wine, 1 - 2 Cans of beer, 1 Shots of liquor per week     Comment: 1-2 glasses of wine  and beer weekly  . Drug use: No  . Sexual activity: Not Asked   Other Topics Concern  . None   Social History Narrative  . None     Family History: The patient's family history includes CAD in his mother, sister, and unknown relative; Heart attack in his unknown relative; Heart disease in his brother; Heart failure in his mother.  ROS:   Please see the history of present illness.    ROS  All other systems reviewed and negative.   EKGs/Labs/Other Studies Reviewed:    The following studies were reviewed today: none  EKG:  EKG is ordered today.  The ekg ordered today demonstrates sinus bradycardia at 49 bpm with nonspecific ST abnormality  Recent Labs: No results found for requested labs within last 8760 hours.   Recent Lipid Panel    Component Value Date/Time   CHOL 104 (L) 12/04/2015 1022   TRIG 118 12/04/2015 1022   HDL 40 12/04/2015 1022   CHOLHDL 2.6 12/04/2015 1022   VLDL 24 12/04/2015 1022   LDLCALC 40 12/04/2015 1022    Physical Exam:    VS:  BP 138/70   Pulse (!) 49   Ht 5' 8.5" (1.74 m)  Wt 216 lb (98 kg)   BMI 32.36 kg/m     Wt Readings from Last 3 Encounters:  12/09/16 216 lb (98 kg)  12/04/15 218 lb 6.4 oz (99.1 kg)  11/30/14 207 lb 12.8 oz (94.3 kg)     GEN:  Well nourished, well developed in no acute distress HEENT: Normal NECK: No JVD; No carotid bruits LYMPHATICS: No lymphadenopathy CARDIAC: RRR, no murmurs, rubs, gallops RESPIRATORY:  Clear to auscultation without rales, wheezing or rhonchi  ABDOMEN: Soft, non-tender, non-distended MUSCULOSKELETAL:  No edema; No deformity  SKIN: Warm and dry NEUROLOGIC:  Alert and oriented x 3 PSYCHIATRIC:  Normal affect   ASSESSMENT:    1. Coronary artery disease involving native coronary artery of native heart without angina pectoris   2. Essential hypertension   3. Dyslipidemia    PLAN:    In order of problems listed above:  1.  ASCAD s/p remote CABG - he has no anginal symptoms.    2.   HTN - BP is well controlled on exam today.  He will continue on HCTZ 25mg  daily, amlodipine 5mg  daily and Toprol XL 50mg  daily.    3.  Hyperlipidemia with LDL goal < 70.  He will continue on Crestor 40mg  daily.  I will get an FLP and ALT today.    Medication Adjustments/Labs and Tests Ordered: Current medicines are reviewed at length with the patient today.  Concerns regarding medicines are outlined above.  No orders of the defined types were placed in this encounter.  No orders of the defined types were placed in this encounter.   Signed, Armanda Magicraci Jeral Zick, MD  12/09/2016 10:19 AM    [facility]

## 2017-05-15 DIAGNOSIS — H2513 Age-related nuclear cataract, bilateral: Secondary | ICD-10-CM | POA: Diagnosis not present

## 2017-05-15 DIAGNOSIS — H3562 Retinal hemorrhage, left eye: Secondary | ICD-10-CM | POA: Diagnosis not present

## 2017-12-11 ENCOUNTER — Encounter: Payer: Self-pay | Admitting: Cardiology

## 2017-12-11 ENCOUNTER — Ambulatory Visit (INDEPENDENT_AMBULATORY_CARE_PROVIDER_SITE_OTHER): Payer: Medicare Other | Admitting: Cardiology

## 2017-12-11 VITALS — BP 130/70 | HR 51 | Ht 68.5 in | Wt 224.0 lb

## 2017-12-11 DIAGNOSIS — E785 Hyperlipidemia, unspecified: Secondary | ICD-10-CM | POA: Diagnosis not present

## 2017-12-11 DIAGNOSIS — I10 Essential (primary) hypertension: Secondary | ICD-10-CM

## 2017-12-11 DIAGNOSIS — I2510 Atherosclerotic heart disease of native coronary artery without angina pectoris: Secondary | ICD-10-CM | POA: Diagnosis not present

## 2017-12-11 LAB — LIPID PANEL
CHOL/HDL RATIO: 2.4 ratio (ref 0.0–5.0)
Cholesterol, Total: 105 mg/dL (ref 100–199)
HDL: 43 mg/dL (ref >39)
LDL Calculated: 40 mg/dL (ref 0–99)
TRIGLYCERIDES: 109 mg/dL (ref 0–149)
VLDL Cholesterol Cal: 22 mg/dL (ref 5–40)

## 2017-12-11 LAB — BASIC METABOLIC PANEL
BUN/Creatinine Ratio: 14 (ref 10–24)
BUN: 16 mg/dL (ref 8–27)
CO2: 26 mmol/L (ref 20–29)
CREATININE: 1.15 mg/dL (ref 0.76–1.27)
Calcium: 9.7 mg/dL (ref 8.6–10.2)
Chloride: 102 mmol/L (ref 96–106)
GFR calc Af Amer: 72 mL/min/{1.73_m2} (ref >59)
GFR, EST NON AFRICAN AMERICAN: 62 mL/min/{1.73_m2} (ref >59)
Glucose: 90 mg/dL (ref 65–99)
Potassium: 4.2 mmol/L (ref 3.5–5.2)
SODIUM: 143 mmol/L (ref 134–144)

## 2017-12-11 LAB — HEPATIC FUNCTION PANEL
ALBUMIN: 4.3 g/dL (ref 3.5–4.8)
ALT: 19 IU/L (ref 0–44)
AST: 18 IU/L (ref 0–40)
Alkaline Phosphatase: 53 IU/L (ref 39–117)
BILIRUBIN TOTAL: 0.6 mg/dL (ref 0.0–1.2)
Bilirubin, Direct: 0.18 mg/dL (ref 0.00–0.40)
Total Protein: 6.9 g/dL (ref 6.0–8.5)

## 2017-12-11 MED ORDER — METOPROLOL SUCCINATE ER 50 MG PO TB24: 50.0000 mg | ORAL_TABLET | Freq: Every day | ORAL | 3 refills | Status: DC

## 2017-12-11 MED ORDER — AMLODIPINE BESYLATE 5 MG PO TABS: 5.0000 mg | ORAL_TABLET | Freq: Every day | ORAL | 3 refills | Status: DC

## 2017-12-11 MED ORDER — ATORVASTATIN CALCIUM 80 MG PO TABS: 80.0000 mg | ORAL_TABLET | Freq: Every day | ORAL | 3 refills | Status: DC

## 2017-12-11 MED ORDER — HYDROCHLOROTHIAZIDE 25 MG PO TABS: 25.0000 mg | ORAL_TABLET | Freq: Every day | ORAL | 3 refills | Status: DC

## 2017-12-11 NOTE — Progress Notes (Signed)
Cardiology Office Note:    Date:  12/11/2017   ID:  [name], DOB Jun 26, 1940, MRN [id number]  PCP:  [name], MD  Cardiologist:  No primary care provider on file.    Referring MD: [name], MD   Chief Complaint  Patient presents with  . Coronary Artery Disease  . Hypertension  . Hyperlipidemia    History of Present Illness:    Jeffery Barr is a 77 y.o. male with a hx of ASCAD s/p remote CABG, HTN and hyperlipidemia.  She is here today for followup and is doing well.  She denies any chest pain or pressure, SOB, DOE, PND, orthopnea, LE edema, dizziness, palpitations or syncope. She is compliant with her meds and is tolerating meds with no SE.    Past Medical History:  Diagnosis Date  . Benign hypertensive heart disease without heart failure   . Coronary atherosclerosis of native coronary artery    S/P CABG  . Dyslipidemia   . HTN (hypertension)   . Obesity     Past Surgical History:  Procedure Laterality Date  . CORONARY ARTERY BYPASS GRAFT    . VASECTOMY      Current Medications: Current Meds  Medication Sig  . amLODipine (NORVASC) 5 MG tablet Take 1 tablet (5 mg total) by mouth daily.  Marland Kitchen aspirin 81 MG tablet Take 81 mg by mouth daily.  . hydrochlorothiazide (HYDRODIURIL) 25 MG tablet Take 1 tablet (25 mg total) by mouth daily.  . metoprolol succinate (TOPROL-XL) 50 MG 24 hr tablet Take 1 tablet (50 mg total) by mouth daily. Take with or immediately following a meal.  . rosuvastatin (CRESTOR) 40 MG tablet Take 1 tablet (40 mg total) by mouth daily.     Allergies:   Patient has no known allergies.   Social History   Socioeconomic History  . Marital status: Married    Spouse name: Not on file  . Number of children: Not on file  . Years of education: Not on file  . Highest education level: Not on file  Occupational History  . Not on file  Social Needs  . Financial resource strain: Not on file  . Food insecurity:    Worry: Not on file     Inability: Not on file  . Transportation needs:    Medical: Not on file    Non-medical: Not on file  Tobacco Use  . Smoking status: Former Games developer  . Smokeless tobacco: Never Used  Substance and Sexual Activity  . Alcohol use: Yes    Alcohol/week: 3.0 - 5.0 standard drinks    Types: 1 - 2 Glasses of wine, 1 - 2 Cans of beer, 1 Shots of liquor per week    Comment: 1-2 glasses of wine and beer weekly  . Drug use: No  . Sexual activity: Not on file  Lifestyle  . Physical activity:    Days per week: Not on file    Minutes per session: Not on file  . Stress: Not on file  Relationships  . Social connections:    Talks on phone: Not on file    Gets together: Not on file    Attends religious service: Not on file    Active member of club or organization: Not on file    Attends meetings of clubs or organizations: Not on file    Relationship status: Not on file  Other Topics Concern  . Not on file  Social History Narrative  . Not on  file     Family History: The patient's family history includes CAD in his mother, sister, and unknown relative; Heart attack in his unknown relative; Heart disease in his brother; Heart failure in his mother.  ROS:   Please see the history of present illness.    ROS  All other systems reviewed and negative.   EKGs/Labs/Other Studies Reviewed:    The following studies were reviewed today: none  EKG:  EKG is ordered today.  The ekg ordered today demonstrates sinus bradycardia at 51bpm with inferior ST/T wave abnormality  Recent Labs: No results found for requested labs within last 8760 hours.   Recent Lipid Panel    Component Value Date/Time   CHOL 106 12/09/2016 1034   TRIG 123 12/09/2016 1034   HDL 42 12/09/2016 1034   CHOLHDL 2.5 12/09/2016 1034   CHOLHDL 2.6 12/04/2015 1022   VLDL 24 12/04/2015 1022   LDLCALC 39 12/09/2016 1034    Physical Exam:    VS:  BP 130/70   Pulse (!) 51   Ht 5' 8.5" (1.74 m)   Wt 224 lb (101.6 kg)    BMI 33.56 kg/m     Wt Readings from Last 3 Encounters:  12/11/17 224 lb (101.6 kg)  12/09/16 216 lb (98 kg)  12/04/15 218 lb 6.4 oz (99.1 kg)     GEN:  Well nourished, well developed in no acute distress HEENT: Normal NECK: No JVD; No carotid bruits LYMPHATICS: No lymphadenopathy CARDIAC: RRR, no murmurs, rubs, gallops RESPIRATORY:  Clear to auscultation without rales, wheezing or rhonchi  ABDOMEN: Soft, non-tender, non-distended MUSCULOSKELETAL:  No edema; No deformity  SKIN: Warm and dry NEUROLOGIC:  Alert and oriented x 3 PSYCHIATRIC:  Normal affect   ASSESSMENT:    1. Coronary artery disease involving native coronary artery of native heart without angina pectoris   2. Essential hypertension   3. Dyslipidemia    PLAN:    In order of problems listed above:  1.  ASCAD - s/p remote CABG.  He denies any anginal symptoms today.  He will continue on aspirin 81 mg daily, beta-blocker and statin.  2.  HTN -BP is well controlled on exam today.  He will continue on amlodipine 5 mg daily, HCTZ 25 mg daily and Toprol XL 50 mg daily.  3.  Hyperlipidemia -LDL goal is less than 70.  His LDL was 39 on 12/09/2016.  I will repeat an FLP and ALT.   Medication Adjustments/Labs and Tests Ordered: Current medicines are reviewed at length with the patient today.  Concerns regarding medicines are outlined above.  Orders Placed This Encounter  Procedures  . EKG 12-Lead   No orders of the defined types were placed in this encounter.   Signed, Armanda Magic, MD  12/11/2017 9:51 AM    [facility]

## 2017-12-11 NOTE — Patient Instructions (Signed)
Medication Instructions:  Start: Lipitor 80 mg, by mouth, daily If you need a refill on your cardiac medications before your next appointment, please call your pharmacy.   Lab work: Today: BMET, LFT and Lipids  If you have labs (blood work) drawn today and your tests are completely normal, you will receive your results only by: Marland Kitchen MyChart Message (if you have MyChart) OR . A paper copy in the mail If you have any lab test that is abnormal or we need to change your treatment, we will call you to review the results.  Follow-Up: At [facility], you and your health needs are our priority.  As part of our continuing mission to provide you with exceptional heart care, we have created designated Provider Care Teams.  These Care Teams include your primary Cardiologist (physician) and Advanced Practice Providers (APPs -  Physician Assistants and Nurse Practitioners) who all work together to provide you with the care you need, when you need it. You will need a follow up appointment in 1 years.  Please call our office 2 months in advance to schedule this appointment.  You may see Dr. Mayford Knife or one of the following Advanced Practice Providers on your designated Care Team:   [place], PA-C Ronie Spies, PA-C . Jacolyn Reedy, PA-C

## 2018-01-01 DIAGNOSIS — I2510 Atherosclerotic heart disease of native coronary artery without angina pectoris: Secondary | ICD-10-CM | POA: Diagnosis not present

## 2018-01-01 DIAGNOSIS — E7800 Pure hypercholesterolemia, unspecified: Secondary | ICD-10-CM | POA: Diagnosis not present

## 2018-01-01 DIAGNOSIS — Z0000 Encounter for general adult medical examination without abnormal findings: Secondary | ICD-10-CM | POA: Diagnosis not present

## 2018-01-01 DIAGNOSIS — I10 Essential (primary) hypertension: Secondary | ICD-10-CM | POA: Diagnosis not present

## 2018-01-01 DIAGNOSIS — M25569 Pain in unspecified knee: Secondary | ICD-10-CM | POA: Diagnosis not present

## 2018-01-27 DIAGNOSIS — M25562 Pain in left knee: Secondary | ICD-10-CM | POA: Diagnosis not present

## 2018-02-04 DIAGNOSIS — M25562 Pain in left knee: Secondary | ICD-10-CM | POA: Diagnosis not present

## 2018-03-04 DIAGNOSIS — S83242D Other tear of medial meniscus, current injury, left knee, subsequent encounter: Secondary | ICD-10-CM | POA: Diagnosis not present

## 2018-03-17 DIAGNOSIS — Y999 Unspecified external cause status: Secondary | ICD-10-CM | POA: Diagnosis not present

## 2018-03-17 DIAGNOSIS — S83232A Complex tear of medial meniscus, current injury, left knee, initial encounter: Secondary | ICD-10-CM | POA: Diagnosis not present

## 2018-03-17 DIAGNOSIS — M94262 Chondromalacia, left knee: Secondary | ICD-10-CM | POA: Diagnosis not present

## 2018-03-17 DIAGNOSIS — G8918 Other acute postprocedural pain: Secondary | ICD-10-CM | POA: Diagnosis not present

## 2018-03-24 DIAGNOSIS — M25562 Pain in left knee: Secondary | ICD-10-CM | POA: Diagnosis not present

## 2018-03-26 DIAGNOSIS — M25562 Pain in left knee: Secondary | ICD-10-CM | POA: Diagnosis not present

## 2018-03-31 DIAGNOSIS — M25562 Pain in left knee: Secondary | ICD-10-CM | POA: Diagnosis not present

## 2018-04-07 DIAGNOSIS — M25562 Pain in left knee: Secondary | ICD-10-CM | POA: Diagnosis not present

## 2018-04-09 DIAGNOSIS — M25562 Pain in left knee: Secondary | ICD-10-CM | POA: Diagnosis not present

## 2018-11-09 ENCOUNTER — Telehealth: Payer: Self-pay | Admitting: Cardiology

## 2018-11-09 NOTE — Telephone Encounter (Signed)
New Message  Patient is scheduled for an annual visit on 12/15/18 at 9:30 am. Patient wants to make sure that he is able to have EKG and blood work done on the same day that he comes in for his appointment. There are no active requests or notes that I see in the system. Please assist.

## 2018-11-09 NOTE — Telephone Encounter (Signed)
Called and made patient aware that we are able to check labs and EKG at the appointment.

## 2018-12-09 ENCOUNTER — Other Ambulatory Visit: Payer: Self-pay | Admitting: Cardiology

## 2018-12-13 ENCOUNTER — Encounter: Payer: Self-pay | Admitting: Physician Assistant

## 2018-12-13 NOTE — Progress Notes (Addendum)
Cardiology Office Note    Date:  12/15/2018   ID:  TREVARIS PENNELLA, DOB 12-04-1940, MRN [id number]  PCP:  Orpah Melter, MD  Cardiologist:  Fransico Him, MD  Electrophysiologist:  None   Chief Complaint: annual f/u CAD  History of Present Illness:   Jeffery Barr is a 78 y.o. male with history of CAD s/p remote CABG (date unclear), HTN, dyslipidemia, obesity, baseline sinus bradycardia, former tobacco use who is here for annual cardiology follow-up. His PMH also references h/o benign hypertensive heart disease without heart failure. He has not required echocardiogram, cath or nuclear stress test since at least inception of [facility]. He does not remember when his bypass was, possibly the early 2000s. Last labs 11/2017 showed LDL of 40, normal LFTs, K 4.2, Cr 1.15, no recent CBC in system. Prior EKGs have shown HR in the 50s at baseline.  He is seen for follow-up today overall stable from cardiac standpoint without any new cardiac symptoms. Denies any CP, SOB, palpitations, edema, syncope or intolerance of medications. His blood pressure is running moderately elevated today, confirmed on recheck bilaterally in the 160s/70s. He is very surprised to hear this. He has not yet taken his AM BP medications. He denies excessive ETOH.   Past Medical History:  Diagnosis Date  . Benign hypertensive heart disease without heart failure   . Coronary atherosclerosis of native coronary artery    S/P CABG  . Dyslipidemia   . HTN (hypertension)   . Obesity   . Sinus bradycardia    a. baseline HR 50s.    Past Surgical History:  Procedure Laterality Date  . CORONARY ARTERY BYPASS GRAFT    . VASECTOMY      Current Medications: Current Meds  Medication Sig  . aspirin 81 MG tablet Take 81 mg by mouth daily.  Marland Kitchen  amLODipine (NORVASC) 5 MG tablet TAKE 1 TABLET BY MOUTH EVERY DAY  . atorvastatin (LIPITOR) 80 MG tablet TAKE 1 TABLET BY MOUTH EVERY DAY  . hydrochlorothiazide (HYDRODIURIL) 25 MG  tablet TAKE 1 TABLET BY MOUTH EVERY DAY  . metoprolol succinate (TOPROL-XL) 50 MG 24 hr tablet TAKE 1 TABLET (50 MG TOTAL) BY MOUTH DAILY. TAKE WITH OR IMMEDIATELY FOLLOWING A MEAL.    Allergies:   Patient has no known allergies.   Social History   Socioeconomic History  . Marital status: Married    Spouse name: Not on file  . Number of children: Not on file  . Years of education: Not on file  . Highest education level: Not on file  Occupational History  . Not on file  Social Needs  . Financial resource strain: Not on file  . Food insecurity    Worry: Not on file    Inability: Not on file  . Transportation needs    Medical: Not on file    Non-medical: Not on file  Tobacco Use  . Smoking status: Former Research scientist (life sciences)  . Smokeless tobacco: Never Used  Substance and Sexual Activity  . Alcohol use: Yes    Alcohol/week: 3.0 - 5.0 standard drinks    Types: 1 - 2 Glasses of wine, 1 - 2 Cans of beer, 1 Shots of liquor per week    Comment: 1-2 glasses of wine and beer weekly  . Drug use: No  . Sexual activity: Not on file  Lifestyle  . Physical activity    Days per week: Not on file    Minutes per session: Not on file  .  Stress: Not on file  Relationships  . Social Musicianconnections    Talks on phone: Not on file    Gets together: Not on file    Attends religious service: Not on file    Active member of club or organization: Not on file    Attends meetings of clubs or organizations: Not on file    Relationship status: Not on file  Other Topics Concern  . Not on file  Social History Narrative  . Not on file     Family History:  The patient's family history includes CAD in his mother, sister, and another family member; Heart attack in an other family member; Heart disease in his brother; Heart failure in his mother.  ROS:   Please see the history of present illness.  All other systems are reviewed and otherwise negative.    EKGs/Labs/Other Studies Reviewed:    Studies reviewed  were summarized above.   EKG:  EKG is ordered today, personally reviewed, demonstrating NSR 61bpm, occasional PVCs, nonspecific STT changes inferiorly and V3-V6 - similar to prior  Recent Labs: No results found for requested labs within last 8760 hours.  Recent Lipid Panel    Component Value Date/Time   CHOL 105 12/11/2017 1023   TRIG 109 12/11/2017 1023   HDL 43 12/11/2017 1023   CHOLHDL 2.4 12/11/2017 1023   CHOLHDL 2.6 12/04/2015 1022   VLDL 24 12/04/2015 1022   LDLCALC 40 12/11/2017 1023    PHYSICAL EXAM:    VS:  BP (!) 152/68   Pulse 61   Ht 5' 8.5" (1.74 m)   Wt 224 lb 12.8 oz (102 kg)   SpO2 99%   BMI 33.68 kg/m   BMI: Body mass index is 33.68 kg/m.  GEN: Well nourished, well developed obese WM, in no acute distress HEENT: normocephalic, atraumatic Neck: no JVD, carotid bruits, or masses Cardiac: RRR; no murmurs, rubs, or gallops, no edema  Respiratory:  clear to auscultation bilaterally, normal work of breathing GI: soft, nontender, nondistended, + BS MS: no deformity or atrophy Skin: warm and dry, no rash Neuro:  Alert and Oriented x 3, Strength and sensation are intact, follows commands Psych: normal affect, expressed dissatisfaction with wait time in room prior to visit (I thanked him for his patience given extenuating circumstances related to previous patient - unable to provide further details without breech of HIPAA)  Wt Readings from Last 3 Encounters:  12/15/18 224 lb 12.8 oz (102 kg)  12/11/17 224 lb (101.6 kg)  12/09/16 216 lb (98 kg)     ASSESSMENT & PLAN:   1. CAD s/p remote CABG - asymptomatic, no recent angina. Continue ASA, BB, statin. Due for surveillance CBC given ongoing ASA. 2. HTN - elevated today. He does not check this at home. I discussed getting a blood pressure cuff at home to evaluate it which he is agreeable to. Instructions relayed on AVS to get arm cuff and check BP at least 3 hours after BP medications once a day for the next 4-5  days and relay those results to us. If BP remains elevated will likely plan to titrate amlodipine and arrange a virtual f/u visit. Will also update thyroid function with labs. 3. Dyslipidemia - check CMET/lipid profile today. He is fasting. 4. Sinus bradycardia - asymptomatic. Tolerating BB without complication.  Disposition: F/u with Dr. Mayford Knifeurner in 1 year.  Medication Adjustments/Labs and Tests Ordered: Current medicines are reviewed at length with the patient today.  Concerns regarding medicines are  outlined above. Medication changes, Labs and Tests ordered today are summarized above and listed in the Patient Instructions accessible in Encounters.   Signed, Laurann Montana, PA-C  12/15/2018 10:23 AM    [facility] [address] [place], [place], [place]  [address] Phone: [phone]; Fax: [phone]

## 2018-12-14 ENCOUNTER — Encounter: Payer: Self-pay | Admitting: Physician Assistant

## 2018-12-15 ENCOUNTER — Other Ambulatory Visit: Payer: Self-pay

## 2018-12-15 ENCOUNTER — Ambulatory Visit (INDEPENDENT_AMBULATORY_CARE_PROVIDER_SITE_OTHER): Payer: Medicare Other | Admitting: Physician Assistant

## 2018-12-15 ENCOUNTER — Encounter: Payer: Self-pay | Admitting: Physician Assistant

## 2018-12-15 VITALS — BP 152/68 | HR 61 | Ht 68.5 in | Wt 224.8 lb

## 2018-12-15 DIAGNOSIS — R001 Bradycardia, unspecified: Secondary | ICD-10-CM

## 2018-12-15 DIAGNOSIS — E785 Hyperlipidemia, unspecified: Secondary | ICD-10-CM

## 2018-12-15 DIAGNOSIS — Z951 Presence of aortocoronary bypass graft: Secondary | ICD-10-CM

## 2018-12-15 DIAGNOSIS — I2510 Atherosclerotic heart disease of native coronary artery without angina pectoris: Secondary | ICD-10-CM

## 2018-12-15 DIAGNOSIS — I10 Essential (primary) hypertension: Secondary | ICD-10-CM

## 2018-12-15 LAB — CBC
Hematocrit: 43 % (ref 37.5–51.0)
Hemoglobin: 14.7 g/dL (ref 13.0–17.7)
MCH: 28.6 pg (ref 26.6–33.0)
MCHC: 34.2 g/dL (ref 31.5–35.7)
MCV: 84 fL (ref 79–97)
Platelets: 216 10*3/uL (ref 150–450)
RBC: 5.14 x10E6/uL (ref 4.14–5.80)
RDW: 12.2 % (ref 11.6–15.4)
WBC: 8.7 10*3/uL (ref 3.4–10.8)

## 2018-12-15 LAB — COMPREHENSIVE METABOLIC PANEL
ALT: 22 IU/L (ref 0–44)
AST: 22 IU/L (ref 0–40)
Albumin/Globulin Ratio: 1.9 (ref 1.2–2.2)
Albumin: 4.4 g/dL (ref 3.7–4.7)
Alkaline Phosphatase: 82 IU/L (ref 39–117)
BUN/Creatinine Ratio: 14 (ref 10–24)
BUN: 16 mg/dL (ref 8–27)
Bilirubin Total: 0.6 mg/dL (ref 0.0–1.2)
CO2: 25 mmol/L (ref 20–29)
Calcium: 9.4 mg/dL (ref 8.6–10.2)
Chloride: 105 mmol/L (ref 96–106)
Creatinine, Ser: 1.18 mg/dL (ref 0.76–1.27)
GFR calc Af Amer: 69 mL/min/{1.73_m2} (ref >59)
GFR calc non Af Amer: 60 mL/min/{1.73_m2} (ref >59)
Globulin, Total: 2.3 g/dL (ref 1.5–4.5)
Glucose: 98 mg/dL (ref 65–99)
Potassium: 4.4 mmol/L (ref 3.5–5.2)
Sodium: 143 mmol/L (ref 134–144)
Total Protein: 6.7 g/dL (ref 6.0–8.5)

## 2018-12-15 LAB — LIPID PANEL
Chol/HDL Ratio: 2.7 ratio (ref 0.0–5.0)
Cholesterol, Total: 116 mg/dL (ref 100–199)
HDL: 43 mg/dL (ref >39)
LDL Chol Calc (NIH): 56 mg/dL (ref 0–99)
Triglycerides: 87 mg/dL (ref 0–149)
VLDL Cholesterol Cal: 17 mg/dL (ref 5–40)

## 2018-12-15 LAB — TSH: TSH: 5 u[IU]/mL — ABNORMAL HIGH (ref 0.450–4.500)

## 2018-12-15 MED ORDER — METOPROLOL SUCCINATE ER 50 MG PO TB24: 50.0000 mg | ORAL_TABLET | Freq: Every day | ORAL | 3 refills | Status: DC

## 2018-12-15 MED ORDER — AMLODIPINE BESYLATE 5 MG PO TABS: 5.0000 mg | ORAL_TABLET | Freq: Every day | ORAL | 3 refills | Status: DC

## 2018-12-15 MED ORDER — HYDROCHLOROTHIAZIDE 25 MG PO TABS: 25.0000 mg | ORAL_TABLET | Freq: Every day | ORAL | 3 refills | Status: DC

## 2018-12-15 MED ORDER — ATORVASTATIN CALCIUM 80 MG PO TABS: 80.0000 mg | ORAL_TABLET | Freq: Every day | ORAL | 3 refills | Status: DC

## 2018-12-15 NOTE — Patient Instructions (Addendum)
Medication Instructions:  Your physician recommends that you continue on your current medications as directed. Please refer to the Current Medication list given to you today.  *If you need a refill on your cardiac medications before your next appointment, please call your pharmacy*  Lab Work: TODAY:  CMET, CBC, TSH, & LIPIDS  If you have labs (blood work) drawn today and your tests are completely normal, you will receive your results only by: Marland Kitchen MyChart Message (if you have MyChart) OR . A paper copy in the mail If you have any lab test that is abnormal or we need to change your treatment, we will call you to review the results.  Testing/Procedures: None ordered  Follow-Up: At [facility], you and your health needs are our priority.  As part of our continuing mission to provide you with exceptional heart care, we have created designated Provider Care Teams.  These Care Teams include your primary Cardiologist (physician) and Advanced Practice Providers (APPs -  Physician Assistants and Nurse Practitioners) who all work together to provide you with the care you need, when you need it.  Your next appointment:   12 months  The format for your next appointment:   In Person  Provider:   You may see Fransico Him, MD or one of the following Advanced Practice Providers on your designated Care Team:    Melina Copa, PA-C  Ermalinda Barrios, PA-C   Other Instructions Please get a blood pressure cuff that goes on your arm. The wrist ones can be inaccurate. If possible, try to select one that also reports your heart rate. The [employer] brand is typically pretty reliable, but you can also ask a pharmacist at the store which one they recommend. To check your blood pressure, remain seated in a chair for a few minutes minutes quietly beforehand, then check it. When you get a cuff, please get those readings over the course 4-5 days and call us/send in MyChart message with them for our review. Given your  heart history, we are looking for a goal of less than 130/80. If you notice several days into your blood pressure readings that your numbers are frequently running higher than this, you can let us know sooner.

## 2019-12-18 ENCOUNTER — Other Ambulatory Visit: Payer: Self-pay | Admitting: Physician Assistant

## 2019-12-19 NOTE — Progress Notes (Addendum)
Cardiology Office Note    Date:  12/21/2019   ID:  SY SAINTJEAN, DOB 06-Nov-1940, MRN [id number]  PCP:  [name], MD  Cardiologist:  Armanda Magic, MD  Electrophysiologist:  None   Chief Complaint: annual f/u CAD  History of Present Illness:   Jeffery Barr is a 79 y.o. male with history of CAD s/p remote CABG (date unclear), HTN, dyslipidemia, obesity, baseline sinus bradycardia, former tobacco use   He is here today for followup and is doing well.  He denies any chest pain or pressure, SOB, DOE, PND, orthopnea, LE edema, dizziness, palpitations or syncope. He is compliant with his meds and is tolerating meds with no SE.    Past Medical History:  Diagnosis Date  . Benign hypertensive heart disease without heart failure   . Coronary atherosclerosis of native coronary artery    S/P CABG  . Dyslipidemia   . HTN (hypertension)   . Obesity   . Sinus bradycardia    a. baseline HR 50s.    Past Surgical History:  Procedure Laterality Date  . CORONARY ARTERY BYPASS GRAFT    . VASECTOMY      Current Medications: Current Meds  Medication Sig  . aspirin 81 MG tablet Take 81 mg by mouth daily.  Marland Kitchen  amLODipine (NORVASC) 5 MG tablet TAKE 1 TABLET BY MOUTH EVERY DAY  . atorvastatin (LIPITOR) 80 MG tablet TAKE 1 TABLET BY MOUTH EVERY DAY  . hydrochlorothiazide (HYDRODIURIL) 25 MG tablet TAKE 1 TABLET BY MOUTH EVERY DAY  . metoprolol succinate (TOPROL-XL) 50 MG 24 hr tablet TAKE 1 TABLET (50 MG TOTAL) BY MOUTH DAILY. TAKE WITH OR IMMEDIATELY FOLLOWING A MEAL.    Allergies:   Patient has no known allergies.   Social History   Socioeconomic History  . Marital status: Married    Spouse name: Not on file  . Number of children: Not on file  . Years of education: Not on file  . Highest education level: Not on file  Occupational History  . Not on file  Tobacco Use  . Smoking status: Former Games developer  . Smokeless tobacco: Never Used  Vaping Use  . Vaping Use: Never used    Substance and Sexual Activity  . Alcohol use: Yes    Alcohol/week: 3.0 - 5.0 standard drinks    Types: 1 - 2 Glasses of wine, 1 - 2 Cans of beer, 1 Shots of liquor per week    Comment: 1-2 glasses of wine and beer weekly  . Drug use: No  . Sexual activity: Not on file  Other Topics Concern  . Not on file  Social History Narrative  . Not on file   Social Determinants of Health   Financial Resource Strain:   . Difficulty of Paying Living Expenses: Not on file  Food Insecurity:   . Worried About Programme researcher, broadcasting/film/video in the Last Year: Not on file  . Ran Out of Food in the Last Year: Not on file  Transportation Needs:   . Lack of Transportation (Medical): Not on file  . Lack of Transportation (Non-Medical): Not on file  Physical Activity:   . Days of Exercise per Week: Not on file  . Minutes of Exercise per Session: Not on file  Stress:   . Feeling of Stress : Not on file  Social Connections:   . Frequency of Communication with Friends and Family: Not on file  . Frequency of Social Gatherings with Friends and Family:  Not on file  . Attends Religious Services: Not on file  . Active Member of Clubs or Organizations: Not on file  . Attends Banker Meetings: Not on file  . Marital Status: Not on file     Family History:  The patient's family history includes CAD in his mother, sister, and another family member; Heart attack in an other family member; Heart disease in his brother; Heart failure in his mother.  ROS:   Please see the history of present illness.  All other systems are reviewed and otherwise negative.    EKGs/Labs/Other Studies Reviewed:    Studies reviewed were summarized above.   EKG:  EKG is ordered today, personally reviewed, demonstrating Sinus bradycardia at 55bpm with PVCs and nonspecific ST abnormality Recent Labs: No results found for requested labs within last 8760 hours.  Recent Lipid Panel    Component Value Date/Time   CHOL 116  12/15/2018 1020   TRIG 87 12/15/2018 1020   HDL 43 12/15/2018 1020   CHOLHDL 2.7 12/15/2018 1020   CHOLHDL 2.6 12/04/2015 1022   VLDL 24 12/04/2015 1022   LDLCALC 56 12/15/2018 1020    PHYSICAL EXAM:    VS:  BP (!) 148/76   Pulse (!) 55   Ht 5\' 9"  (1.753 m)   Wt 214 lb (97.1 kg) Comment: Pt stated weight refused to weigh  BMI 31.60 kg/m   BMI: Body mass index is 31.6 kg/m.  GEN: Well nourished, well developed in no acute distress HEENT: Normal NECK: No JVD; No carotid bruits LYMPHATICS: No lymphadenopathy CARDIAC:RRR, no murmurs, rubs, gallops RESPIRATORY:  Clear to auscultation without rales, wheezing or rhonchi  ABDOMEN: Soft, non-tender, non-distended MUSCULOSKELETAL:  No edema; No deformity  SKIN: Warm and dry NEUROLOGIC:  Alert and oriented x 3 PSYCHIATRIC:  Normal affect    Wt Readings from Last 3 Encounters:  12/21/19 214 lb (97.1 kg)  12/15/18 224 lb 12.8 oz (102 kg)  12/11/17 224 lb (101.6 kg)     ASSESSMENT & PLAN:   1. CAD  -s/p remote CABG -he has not had any anginal symptoms -Continue ASA, BB, statin.   2.  HTN  -BP borderline controlled on exam -I have asked him to check his BP a few times this week and call with the results -continue amlodipine 5mg  daily, HCTZ 25mg  daily and Toprol XL 50mg  daily -I have encouraged him to increase aerobic exercise  3.  Dyslipidemia  -LDL goal < 70 -check FLP and ALT -continue atorvastatin 80mg  daily  4.  Sinus bradycardia  -asymptomatic.  -Tolerating BB without complication.  Disposition: F/u 1 year  Medication Adjustments/Labs and Tests Ordered: Current medicines are reviewed at length with the patient today.  Concerns regarding medicines are outlined above. Medication changes, Labs and Tests ordered today are summarized above and listed in the Patient Instructions accessible in Encounters.   Signed, 12/13/17, MD  12/21/2019 9:52 AM    [facility] [address] [place],  [place], 13/03/2019  [facility] Phone: [phone]; Fax: [phone]

## 2019-12-21 ENCOUNTER — Encounter: Payer: Self-pay | Admitting: Cardiology

## 2019-12-21 ENCOUNTER — Other Ambulatory Visit: Payer: Self-pay

## 2019-12-21 ENCOUNTER — Ambulatory Visit (INDEPENDENT_AMBULATORY_CARE_PROVIDER_SITE_OTHER): Payer: Medicare Other | Admitting: Cardiology

## 2019-12-21 VITALS — BP 148/76 | HR 55 | Ht 69.0 in | Wt 214.0 lb

## 2019-12-21 DIAGNOSIS — R001 Bradycardia, unspecified: Secondary | ICD-10-CM | POA: Diagnosis not present

## 2019-12-21 DIAGNOSIS — E785 Hyperlipidemia, unspecified: Secondary | ICD-10-CM

## 2019-12-21 DIAGNOSIS — I2510 Atherosclerotic heart disease of native coronary artery without angina pectoris: Secondary | ICD-10-CM | POA: Diagnosis not present

## 2019-12-21 DIAGNOSIS — I10 Essential (primary) hypertension: Secondary | ICD-10-CM

## 2019-12-21 LAB — COMPREHENSIVE METABOLIC PANEL
ALT: 32 IU/L (ref 0–44)
AST: 25 IU/L (ref 0–40)
Albumin/Globulin Ratio: 1.9 (ref 1.2–2.2)
Albumin: 4.6 g/dL (ref 3.7–4.7)
Alkaline Phosphatase: 88 IU/L (ref 44–121)
BUN/Creatinine Ratio: 14 (ref 10–24)
BUN: 16 mg/dL (ref 8–27)
Bilirubin Total: 0.8 mg/dL (ref 0.0–1.2)
CO2: 26 mmol/L (ref 20–29)
Calcium: 9.7 mg/dL (ref 8.6–10.2)
Chloride: 104 mmol/L (ref 96–106)
Creatinine, Ser: 1.17 mg/dL (ref 0.76–1.27)
GFR calc Af Amer: 69 mL/min/{1.73_m2} (ref >59)
GFR calc non Af Amer: 60 mL/min/{1.73_m2} (ref >59)
Globulin, Total: 2.4 g/dL (ref 1.5–4.5)
Glucose: 92 mg/dL (ref 65–99)
Potassium: 4.2 mmol/L (ref 3.5–5.2)
Sodium: 142 mmol/L (ref 134–144)
Total Protein: 7 g/dL (ref 6.0–8.5)

## 2019-12-21 LAB — LIPID PANEL
Chol/HDL Ratio: 2.7 ratio (ref 0.0–5.0)
Cholesterol, Total: 117 mg/dL (ref 100–199)
HDL: 44 mg/dL (ref >39)
LDL Chol Calc (NIH): 55 mg/dL (ref 0–99)
Triglycerides: 92 mg/dL (ref 0–149)
VLDL Cholesterol Cal: 18 mg/dL (ref 5–40)

## 2019-12-21 NOTE — Patient Instructions (Signed)
Check your blood pressure a couple times over the next week and call us with your readings.   Medication Instructions:  Your physician recommends that you continue on your current medications as directed. Please refer to the Current Medication list given to you today.  *If you need a refill on your cardiac medications before your next appointment, please call your pharmacy*  Lab Work: TODAY: CMET and FLP If you have labs (blood work) drawn today and your tests are completely normal, you will receive your results only by: Marland Kitchen MyChart Message (if you have MyChart) OR . A paper copy in the mail If you have any lab test that is abnormal or we need to change your treatment, we will call you to review the results.  Follow-Up: At [facility], you and your health needs are our priority.  As part of our continuing mission to provide you with exceptional heart care, we have created designated Provider Care Teams.  These Care Teams include your primary Cardiologist (physician) and Advanced Practice Providers (APPs -  Physician Assistants and Nurse Practitioners) who all work together to provide you with the care you need, when you need it.  Your next appointment:   1 year(s)  The format for your next appointment:   In Person  Provider:   You may see Armanda Magic, MD or one of the following Advanced Practice Providers on your designated Care Team:    Ronie Spies, PA-C  Jacolyn Reedy, PA-C

## 2019-12-21 NOTE — Addendum Note (Signed)
Addended by: Theresia Majors on: 12/21/2019 10:03 AM   Modules accepted: Orders

## 2021-05-30 ENCOUNTER — Observation Stay (HOSPITAL_BASED_OUTPATIENT_CLINIC_OR_DEPARTMENT_OTHER)
Admission: EM | Admit: 2021-05-30 | Discharge: 2021-06-01 | Disposition: A | Discharge status: Home / Self Care | Payer: Medicare Other | Attending: Internal Medicine | Admitting: Internal Medicine

## 2021-05-30 ENCOUNTER — Other Ambulatory Visit: Payer: Self-pay

## 2021-05-30 ENCOUNTER — Emergency Department (HOSPITAL_BASED_OUTPATIENT_CLINIC_OR_DEPARTMENT_OTHER): Payer: Medicare Other

## 2021-05-30 DIAGNOSIS — R471 Dysarthria and anarthria: Secondary | ICD-10-CM | POA: Insufficient documentation

## 2021-05-30 DIAGNOSIS — R001 Bradycardia, unspecified: Secondary | ICD-10-CM | POA: Diagnosis not present

## 2021-05-30 DIAGNOSIS — Z79899 Other long term (current) drug therapy: Secondary | ICD-10-CM | POA: Diagnosis not present

## 2021-05-30 DIAGNOSIS — R9431 Abnormal electrocardiogram [ECG] [EKG]: Secondary | ICD-10-CM | POA: Insufficient documentation

## 2021-05-30 DIAGNOSIS — N400 Enlarged prostate without lower urinary tract symptoms: Secondary | ICD-10-CM | POA: Diagnosis not present

## 2021-05-30 DIAGNOSIS — R29701 NIHSS score 1: Secondary | ICD-10-CM | POA: Insufficient documentation

## 2021-05-30 DIAGNOSIS — R29810 Facial weakness: Secondary | ICD-10-CM | POA: Insufficient documentation

## 2021-05-30 DIAGNOSIS — R2681 Unsteadiness on feet: Secondary | ICD-10-CM | POA: Diagnosis not present

## 2021-05-30 DIAGNOSIS — Z87891 Personal history of nicotine dependence: Secondary | ICD-10-CM | POA: Diagnosis not present

## 2021-05-30 DIAGNOSIS — I110 Hypertensive heart disease with heart failure: Secondary | ICD-10-CM | POA: Diagnosis not present

## 2021-05-30 DIAGNOSIS — T50916A Underdosing of multiple unspecified drugs, medicaments and biological substances, initial encounter: Secondary | ICD-10-CM | POA: Insufficient documentation

## 2021-05-30 DIAGNOSIS — R4701 Aphasia: Secondary | ICD-10-CM | POA: Insufficient documentation

## 2021-05-30 DIAGNOSIS — G459 Transient cerebral ischemic attack, unspecified: Secondary | ICD-10-CM | POA: Diagnosis not present

## 2021-05-30 DIAGNOSIS — Z8249 Family history of ischemic heart disease and other diseases of the circulatory system: Secondary | ICD-10-CM | POA: Diagnosis not present

## 2021-05-30 DIAGNOSIS — I63233 Cerebral infarction due to unspecified occlusion or stenosis of bilateral carotid arteries: Secondary | ICD-10-CM | POA: Insufficient documentation

## 2021-05-30 DIAGNOSIS — I2510 Atherosclerotic heart disease of native coronary artery without angina pectoris: Secondary | ICD-10-CM | POA: Diagnosis not present

## 2021-05-30 DIAGNOSIS — N179 Acute kidney failure, unspecified: Secondary | ICD-10-CM | POA: Insufficient documentation

## 2021-05-30 DIAGNOSIS — Z7982 Long term (current) use of aspirin: Secondary | ICD-10-CM | POA: Insufficient documentation

## 2021-05-30 DIAGNOSIS — Z6831 Body mass index (BMI) 31.0-31.9, adult: Secondary | ICD-10-CM | POA: Insufficient documentation

## 2021-05-30 DIAGNOSIS — Z951 Presence of aortocoronary bypass graft: Secondary | ICD-10-CM | POA: Insufficient documentation

## 2021-05-30 DIAGNOSIS — R4781 Slurred speech: Secondary | ICD-10-CM | POA: Insufficient documentation

## 2021-05-30 DIAGNOSIS — D72829 Elevated white blood cell count, unspecified: Secondary | ICD-10-CM | POA: Insufficient documentation

## 2021-05-30 DIAGNOSIS — I5021 Acute systolic (congestive) heart failure: Secondary | ICD-10-CM | POA: Insufficient documentation

## 2021-05-30 DIAGNOSIS — Z91128 Patient's intentional underdosing of medication regimen for other reason: Secondary | ICD-10-CM | POA: Insufficient documentation

## 2021-05-30 DIAGNOSIS — G8321 Monoplegia of upper limb affecting right dominant side: Secondary | ICD-10-CM | POA: Insufficient documentation

## 2021-05-30 DIAGNOSIS — E785 Hyperlipidemia, unspecified: Secondary | ICD-10-CM | POA: Insufficient documentation

## 2021-05-30 DIAGNOSIS — I6782 Cerebral ischemia: Secondary | ICD-10-CM | POA: Insufficient documentation

## 2021-05-30 DIAGNOSIS — R531 Weakness: Secondary | ICD-10-CM

## 2021-05-30 DIAGNOSIS — R29818 Other symptoms and signs involving the nervous system: Secondary | ICD-10-CM | POA: Diagnosis not present

## 2021-05-30 DIAGNOSIS — Z91148 Patient's other noncompliance with medication regimen for other reason: Secondary | ICD-10-CM | POA: Insufficient documentation

## 2021-05-30 DIAGNOSIS — E669 Obesity, unspecified: Secondary | ICD-10-CM | POA: Diagnosis not present

## 2021-05-30 DIAGNOSIS — I10 Essential (primary) hypertension: Secondary | ICD-10-CM | POA: Insufficient documentation

## 2021-05-30 HISTORY — DX: Transient cerebral ischemic attack, unspecified: G45.9

## 2021-05-30 LAB — DIFFERENTIAL
Abs Immature Granulocytes: 0.04 10*3/uL (ref 0.00–0.07)
Basophils Absolute: 0.1 10*3/uL (ref 0.0–0.1)
Basophils Relative: 1 %
Eosinophils Absolute: 0.2 10*3/uL (ref 0.0–0.5)
Eosinophils Relative: 2 %
Immature Granulocytes: 0 %
Lymphocytes Relative: 25 %
Lymphs Abs: 2.6 10*3/uL (ref 0.7–4.0)
Monocytes Absolute: 0.9 10*3/uL (ref 0.1–1.0)
Monocytes Relative: 8 %
Neutro Abs: 6.8 10*3/uL (ref 1.7–7.7)
Neutrophils Relative %: 64 %

## 2021-05-30 LAB — COMPREHENSIVE METABOLIC PANEL
ALT: 18 U/L (ref 0–44)
AST: 23 U/L (ref 15–41)
Albumin: 4.2 g/dL (ref 3.5–5.0)
Alkaline Phosphatase: 67 U/L (ref 38–126)
Anion gap: 7 (ref 5–15)
BUN: 25 mg/dL — ABNORMAL HIGH (ref 8–23)
CO2: 27 mmol/L (ref 22–32)
Calcium: 9.6 mg/dL (ref 8.9–10.3)
Chloride: 108 mmol/L (ref 98–111)
Creatinine, Ser: 1.54 mg/dL — ABNORMAL HIGH (ref 0.61–1.24)
GFR, Estimated: 46 mL/min — ABNORMAL LOW (ref >60)
Glucose, Bld: 134 mg/dL — ABNORMAL HIGH (ref 70–99)
Potassium: 4.1 mmol/L (ref 3.5–5.1)
Sodium: 142 mmol/L (ref 135–145)
Total Bilirubin: 0.6 mg/dL (ref 0.3–1.2)
Total Protein: 7.4 g/dL (ref 6.5–8.1)

## 2021-05-30 LAB — CBC
HCT: 46.2 % (ref 39.0–52.0)
Hemoglobin: 15.4 g/dL (ref 13.0–17.0)
MCH: 29.4 pg (ref 26.0–34.0)
MCHC: 33.3 g/dL (ref 30.0–36.0)
MCV: 88.2 fL (ref 80.0–100.0)
Platelets: 199 10*3/uL (ref 150–400)
RBC: 5.24 MIL/uL (ref 4.22–5.81)
RDW: 13 % (ref 11.5–15.5)
WBC: 10.6 10*3/uL — ABNORMAL HIGH (ref 4.0–10.5)
nRBC: 0 % (ref 0.0–0.2)

## 2021-05-30 LAB — CBG MONITORING, ED: Glucose-Capillary: 121 mg/dL — ABNORMAL HIGH (ref 70–99)

## 2021-05-30 LAB — APTT: aPTT: 27 seconds (ref 24–36)

## 2021-05-30 LAB — PROTIME-INR
INR: 1.1 (ref 0.8–1.2)
Prothrombin Time: 14 seconds (ref 11.4–15.2)

## 2021-05-30 MED ORDER — SODIUM CHLORIDE 0.9 % IV BOLUS
1000.0000 mL | Freq: Once | INTRAVENOUS | Status: AC
  Administered 2021-05-30: 1000 mL via INTRAVENOUS

## 2021-05-30 NOTE — Progress Notes (Signed)
Code Stroke Activated @1934 . mRS 0. LKWT [id number]. Patient left for CT @1939 . Dr. Su Monks @1943 . Dr. Quinn Axe on Stroke cart @1947 . Patient arrived back in room from CT @1948 . NIHSS done @1950 . NIH 1 for dysarthria. Dr. Quinn Axe reviewed images and has NCCT results @1951 .  ?

## 2021-05-30 NOTE — ED Triage Notes (Signed)
Pt was eating and was unable to pick up his fork with his right arm and had drooping to the right side of his face. LKW 1900.  ? ?Expressive aphasia and unsteady gait. Family noticed he was leaning to right side at table. ?

## 2021-05-30 NOTE — ED Provider Notes (Signed)
?[facility] EMERGENCY DEPARTMENT ?Provider Note ? ? ?CSN: [id number] ?Arrival date & time: 05/30/21  1925 ? ?  ? ?History ? ?Chief Complaint  ?Patient presents with  ? Aphasia  ? Extremity Weakness  ? ? ?Jeffery Barr is a 81 y.o. male. ? ?The history is provided by the patient, the spouse and a relative.  ?Extremity Weakness ? ? ?Patient with medical history notable for hypertension, CAD, CABG not currently anticoagulated, presents today due to aphasia and right upper extremity weakness.  [place]  6:40 PM. While eating dinnre he had difficulty finding words, he had dysarthia and right facial droop.  He was having difficulty using his fork and his right upper arm was weak.  He was having expressive aphasia, fell over to the right but did not hit his head.  No history of previous strokes or TIAs.  ? ?Home Medications ?Prior to Admission medications   ?Medication Sig Start Date End Date Taking? Authorizing Provider  ?amLODipine (NORVASC) 5 MG tablet TAKE 1 TABLET BY MOUTH EVERY DAY 12/20/19   Sueanne Margarita, MD  ?aspirin 81 MG tablet Take 81 mg by mouth daily.    [provider]  ?atorvastatin (LIPITOR) 80 MG tablet TAKE 1 TABLET BY MOUTH EVERY DAY 12/20/19   Sueanne Margarita, MD  ?hydrochlorothiazide (HYDRODIURIL) 25 MG tablet TAKE 1 TABLET BY MOUTH EVERY DAY 12/20/19   Sueanne Margarita, MD  ?metoprolol succinate (TOPROL-XL) 50 MG 24 hr tablet TAKE 1 TABLET (50 MG TOTAL) BY MOUTH DAILY. TAKE WITH OR IMMEDIATELY FOLLOWING A MEAL. 12/20/19   Sueanne Margarita, MD  ?   ? ?Allergies    ?Patient has no known allergies.   ? ?Review of Systems   ?Review of Systems  ?Musculoskeletal:  Positive for extremity weakness.  ? ?Physical Exam ?Updated Vital Signs ?BP (!) 159/67   Pulse (!) 55   Temp 98.3 ?F (36.8 ?C) (Oral)   Resp 14   Ht 5\' 9"  (1.753 m)   Wt 95.3 kg   SpO2 100%   BMI 31.04 kg/m?  ?Physical Exam ?Vitals and nursing note reviewed. Exam conducted with a chaperone present.  ?Constitutional:   ?    Appearance: Normal appearance.  ?HENT:  ?   Head: Normocephalic and atraumatic.  ?Eyes:  ?   General: No scleral icterus.    ?   Right eye: No discharge.     ?   Left eye: No discharge.  ?   Extraocular Movements: Extraocular movements intact.  ?   Pupils: Pupils are equal, round, and reactive to light.  ?Cardiovascular:  ?   Rate and Rhythm: Normal rate and regular rhythm.  ?   Pulses: Normal pulses.  ?   Heart sounds: Normal heart sounds. No murmur heard. ?  No friction rub. No gallop.  ?Pulmonary:  ?   Effort: Pulmonary effort is normal. No respiratory distress.  ?   Breath sounds: Normal breath sounds.  ?Abdominal:  ?   General: Abdomen is flat. Bowel sounds are normal. There is no distension.  ?   Palpations: Abdomen is soft.  ?   Tenderness: There is no abdominal tenderness.  ?Skin: ?   General: Skin is warm and dry.  ?   Coloration: Skin is not jaundiced.  ?Neurological:  ?   Mental Status: He is alert and oriented to person, place, and time.  ?   Coordination: Coordination normal.  ?   Comments: Slight dysarthria. Cranial nerves III through XII  are grossly intact, no pronator drift.  Able to raise both upper and lower extremities.    ? ? ?ED Results / Procedures / Treatments   ?Labs ?(all labs ordered are listed, but only abnormal results are displayed) ?Labs Reviewed  ?CBC - Abnormal; Notable for the following components:  ?    Result Value  ? WBC 10.6 (*)   ? All other components within normal limits  ?COMPREHENSIVE METABOLIC PANEL - Abnormal; Notable for the following components:  ? Glucose, Bld 134 (*)   ? BUN 25 (*)   ? Creatinine, Ser 1.54 (*)   ? GFR, Estimated 46 (*)   ? All other components within normal limits  ?CBG MONITORING, ED - Abnormal; Notable for the following components:  ? Glucose-Capillary 121 (*)   ? All other components within normal limits  ?DIFFERENTIAL  ?PROTIME-INR  ?APTT  ?CBG MONITORING, ED  ? ? ?EKG ?EKG Interpretation ? ?Date/Time:  Wednesday May 30 2021 19:59:05  EDT ?Ventricular Rate:  65 ?PR Interval:  140 ?QRS Duration: 102 ?QT Interval:  400 ?QTC Calculation: 416 ?R Axis:   84 ?Text Interpretation: Sinus rhythm Borderline right axis deviation Borderline repolarization abnormality No significant change since last tracing Confirmed by Wandra Arthurs [id number]) on 05/30/2021 8:41:42 PM ? ?Radiology ?CT HEAD CODE STROKE WO CONTRAST` ? ?Result Date: 05/30/2021 ?CLINICAL DATA:  Code stroke. Acute neuro deficit. Expressive aphasia unsteady gait. Right-sided weakness EXAM: CT HEAD WITHOUT CONTRAST TECHNIQUE: Contiguous axial images were obtained from the base of the skull through the vertex without intravenous contrast. RADIATION DOSE REDUCTION: This exam was performed according to the departmental dose-optimization program which includes automated exposure control, adjustment of the mA and/or kV according to patient size and/or use of iterative reconstruction technique. COMPARISON:  None. FINDINGS: Brain: Mild atrophy. Mild patchy white matter hypodensity bilaterally appears chronic. Negative for acute infarct, hemorrhage, mass. Negative for hydrocephalus Vascular: Negative for hyperdense vessel Skull: Negative Sinuses/Orbits: Mild mucosal edema paranasal negative orbit Other: None ASPECTS ([place] Stroke Program Early CT Score) - Ganglionic level infarction (caudate, lentiform nuclei, internal capsule, insula, M1-M3 cortex): 7 - Supraganglionic infarction (M4-M6 cortex): 3 Total score (0-10 with 10 being normal): 10 IMPRESSION: 1. No acute intracranial abnormality 2. ASPECTS is 10 3. Code stroke imaging results were communicated on 05/30/2021 at 7:50 pm to provider Darl Householder via telephone Electronically Signed   By: Franchot Gallo M.D.   On: 05/30/2021 19:52   ? ?Procedures ?Marland KitchenCritical Care ?Performed by: Sherrill Raring, PA-C ?Authorized by: Sherrill Raring, PA-C  ? ?Critical care provider statement:  ?  Critical care time (minutes):  30 ?  Critical care start time:  05/30/2021 10:00 PM ?  Critical  care end time:  05/30/2021 10:30 PM ?  Critical care time was exclusive of:  Separately billable procedures and treating other patients ?  Critical care was necessary to treat or prevent imminent or life-threatening deterioration of the following conditions:  CNS failure or compromise ?  Critical care was time spent personally by me on the following activities:  Development of treatment plan with patient or surrogate, discussions with consultants, evaluation of patient's response to treatment, examination of patient, ordering and review of laboratory studies, ordering and review of radiographic studies, ordering and performing treatments and interventions, pulse oximetry, re-evaluation of patient's condition and review of old charts ?  Care discussed with: admitting provider    ? ? ?Medications Ordered in ED ?Medications  ?sodium chloride 0.9 % bolus 1,000 mL (0 mLs Intravenous  Stopped 05/30/21 2225)  ? ? ?ED Course/ Medical Decision Making/ A&P ?  ?                        ?Medical Decision Making ?Amount and/or Complexity of Data Reviewed ?Labs: ordered. ?Radiology: ordered. ? ?Risk ?Decision regarding hospitalization. ? ? ?This patient presents to the ED for concern of dysarthria and extremity weakness, this involves an extensive number of treatment options, and is a complaint that carries with it a high risk of complications and morbidity.  The differential diagnosis includes but not limited to hemorrhagic stroke, ischemic stroke, TIA ? ?Patient?s presentation is complicated by their history of hypertension and recent CABG. ? ?Additional history obtained:  ? ?Independent historian: wife, sister ? ?Reviewed external records, patient is not on any anticoagulation. ? ?  ?Lab Tests: ? ?I ordered, viewed, and personally interpreted labs.  The pertinent results include: New ?CBC with a slight leukocytosis at 10.6.  No anemia. ?CMP with AKI with a creatinine of 1.54.  BUN elevated but no gross electrolyte  derangement. ?Glucose 121. ?PT/INR, APTT unremarkable. ? ?  ?Imaging Studies ordered: ? ?I directly visualized the CT head, which showed no acute hemorrhagic stroke findings ? ?I agree with the radiologist interpretation ?  ? ?ECG/Cardiac m

## 2021-05-30 NOTE — ED Notes (Addendum)
Patient given refreshments to eat. Slurred speech a has improve with normal conversation. However when using the NIH scale, slurred speech is heard when reading the words from the visual aids.  ?

## 2021-05-30 NOTE — ED Notes (Signed)
Wife reports that her sister noticed the patient was off balance yesterday evening. Today 1840, the patient was attempting to eat dinner and was not able to hold the knife correctly. He also could not hold the cup correctly. His wife states she was sitting directly inform on him and his right hand was moving irregular and he could not find his words. Patient states prior to eating dinner he had just woken up from a nap and had not felt any different. ?

## 2021-05-30 NOTE — ED Notes (Signed)
Per Neuorlogy: Q15 VS, NIH Q30, 220SBP Permissive HTN. Patient will be watched for 4.5 hours from 1840, incase symptoms change.  ?

## 2021-05-30 NOTE — ED Notes (Signed)
Back from CT

## 2021-05-30 NOTE — Consult Note (Signed)
NEUROLOGY TELECONSULTATION NOTE  ? ?Date of service: May 30, 2021 ?Patient Name: Jeffery Barr ?MRN:  [id number] ?DOB:  1940/05/01 ?Reason for consult: telestroke ? ?Requesting Provider: Dr. Shirlyn Goltz ?Consult Participants: myself, patient, bedside RN, telestroke RN ?Location of the provider: [place], [place] ?Location of the patient: [place] ? ?This consult was provided via telemedicine with 2-way video and audio communication. The patient/family was informed that care would be provided in this way and agreed to receive care in this manner.  ? ?_ _ _   _ __   _ __ _ _  __ __   _ __   __ _ ? ?History of Present Illness  ? ?81 yo man with hx CAD, HTN, HL, sinus bradycardia presents after acute onset word finding difficulty, dysarthria, and R sided weakness. LKW 1840. He was eating dinner and began slurring his speech and couldn't find correct words to say. He dropped the fork from his R hand and was weak in face/arm/leg on the right. Wife noted R facial droop. Sx rapidly improved, and by the time they arrived to ED he had only mild dysarthria and no other sx. NIHSS = 1. CT head personally reviewed, NAICP. TNK not administered 2/2 mild and rapidly improving sx. Exam not c/w LVO therefore CTA not performed as part of stroke code. Given AKI on labs, unless his exam acutely worsens will defer cerebrovascular imaging until he can get MRA at [facility]. ?  ?Patient is not on anticoagulation. He was recommended by his doctor to take daily ASA 81mg  but he has not actually been taking it. ?  ?ROS  ? ?Per HPI; all other systems reviewed and are negative ? ?Past History  ? ?The following was personally reviewed: ? ?Past Medical History:  ?Diagnosis Date  ? Benign hypertensive heart disease without heart failure   ? Coronary atherosclerosis of native coronary artery   ? S/P CABG  ? Dyslipidemia   ? HTN (hypertension)   ? Obesity   ? Sinus bradycardia   ? a. baseline HR 50s.  ? ?Past Surgical History:  ?Procedure  Laterality Date  ? CORONARY ARTERY BYPASS GRAFT    ? VASECTOMY    ? ?Family History  ?Problem Relation Age of Onset  ? Heart failure Mother   ? CAD Mother   ? Heart disease Brother   ? CAD Sister   ? CAD Other   ? Heart attack Other   ? ?Social History  ? ?Socioeconomic History  ? Marital status: Married  ?  Spouse name: Not on file  ? Number of children: Not on file  ? Years of education: Not on file  ? Highest education level: Not on file  ?Occupational History  ? Not on file  ?Tobacco Use  ? Smoking status: Former  ? Smokeless tobacco: Never  ?Vaping Use  ? Vaping Use: Never used  ?Substance and Sexual Activity  ? Alcohol use: Yes  ?  Alcohol/week: 3.0 - 5.0 standard drinks  ?  Types: 1 - 2 Glasses of wine, 1 - 2 Cans of beer, 1 Shots of liquor per week  ?  Comment: 1-2 glasses of wine and beer weekly  ? Drug use: No  ? Sexual activity: Not on file  ?Other Topics Concern  ? Not on file  ?Social History Narrative  ? Not on file  ? ?Social Determinants of Health  ? ?Financial Resource Strain: Not on file  ?Food Insecurity: Not on file  ?  Transportation Needs: Not on file  ?Physical Activity: Not on file  ?Stress: Not on file  ?Social Connections: Not on file  ? ?No Known Allergies ? ?Medications  ? ?(Not in a hospital admission) ?  ? ?No current facility-administered medications for this encounter. ? ?Current Outpatient Medications:  ?  amLODipine (NORVASC) 5 MG tablet, TAKE 1 TABLET BY MOUTH EVERY DAY, Disp: 90 tablet, Rfl: 3 ?  aspirin 81 MG tablet, Take 81 mg by mouth daily., Disp: , Rfl:  ?  atorvastatin (LIPITOR) 80 MG tablet, TAKE 1 TABLET BY MOUTH EVERY DAY, Disp: 90 tablet, Rfl: 3 ?  hydrochlorothiazide (HYDRODIURIL) 25 MG tablet, TAKE 1 TABLET BY MOUTH EVERY DAY, Disp: 90 tablet, Rfl: 3 ?  metoprolol succinate (TOPROL-XL) 50 MG 24 hr tablet, TAKE 1 TABLET (50 MG TOTAL) BY MOUTH DAILY. TAKE WITH OR IMMEDIATELY FOLLOWING A MEAL., Disp: 90 tablet, Rfl: 3 ? ?Vitals  ? ?Vitals:  ? 05/30/21 1939 05/30/21 1950  05/30/21 2000 05/30/21 2015  ?BP:  (!) 190/93 (!) 175/79 (!) 152/86  ?Pulse:  68 65 64  ?Resp:  (!) 23 17 17   ?Temp:      ?TempSrc:      ?SpO2:  99% 97% 99%  ?Weight:      ?Height: 5\' 9"  (1.753 m)     ?  ? ?Body mass index is 31.04 kg/m?. ? ?Physical Exam  ? ?Exam performed over telemedicine with 2-way video and audio communication and with assistance of bedside RN ? ?Physical Exam ?Gen: A&O x4, NAD ?Resp: normal WOB ?CV: extremities appear well-perfused ? ?Neuro: ?*MS: A&O x4. Follows multi-step commands.  ?*Speech: mild dysarthria, no aphasia, able to name and repeat ?*CN: PERRL 79mm, EOMI, VFF by confrontation, sensation intact, smile symmetric, hearing intact to voice ?*Motor:   Normal bulk.  No tremor, rigidity or bradykinesia. No pronator drift. All extremities appear full-strength and symmetric. ?*Sensory: SILT. Symmetric. No double-simultaneous extinction.  ?*Coordination:  Finger-to-nose, heel-to-shin, rapid alternating motions were intact. ?*Reflexes:  UTA 2/2 tele-exam ?*Gait: deferred ? ?NIHSS = 1 for dysarthria ? ? ?Premorbid mRS = 1 ? ? ?Labs  ? ?CBC:  ?Recent Labs  ?Lab 05/30/21 ?1938  ?WBC 10.6*  ?NEUTROABS 6.8  ?HGB 15.4  ?HCT 46.2  ?MCV 88.2  ?PLT 199  ? ? ?Basic Metabolic Panel:  ?Lab Results  ?Component Value Date  ? NA 142 05/30/2021  ? K 4.1 05/30/2021  ? CO2 27 05/30/2021  ? GLUCOSE 134 (H) 05/30/2021  ? BUN 25 (H) 05/30/2021  ? CREATININE 1.54 (H) 05/30/2021  ? CALCIUM 9.6 05/30/2021  ? GFRNONAA 46 (L) 05/30/2021  ? GFRAA 69 12/21/2019  ? ?Lipid Panel:  ?Lab Results  ?Component Value Date  ? [place] 55 12/21/2019  ? ?HgbA1c: No results found for: HGBA1C ?Urine Drug Screen: No results found for: LABOPIA, COCAINSCRNUR, [place], [place], THCU, LABBARB  ?Alcohol Level No results found for: ETH ? ? ?Impression  ? ?81 yo man with hx CAD, HTN, HL, sinus bradycardia presents after acute onset word finding difficulty, dysarthria, and R sided weakness. LKW 1840. CT head NAICP. Sx have completely  resolved except for mild dysarthria (NIHSS = 1) therefore TNK was not administered. He should be observed in ED until outside the TNK window (2310) on increased monitoring protocol below since if his exam were to worsen he may be a candidate for TNK until that time. If no worsening in exam by 2310 at that point he should be admitted to hospitalist service at [facility]  for TIA/stroke workup. Given AKI on labs, unless his exam acutely worsens will defer cerebrovascular imaging until he can get MRA at [facility]. ? ?Recommendations  ? ?- Observe in ED until outside of the TNK window (11:10pm) on increased monitoring protocol: vital signs q 15 min, NIHSS q 30 min. If any decline in neuro exam telestroke should be immediately reactivated. ?- If no change in exam by 11:10pm, at that point patient should be admitted to hospitalist service at [facility] for stroke/TIA workup. Please notify [facility] neurohospitalist when patient arrives to [place] ?- Permissive HTN x48 hrs from sx onset or until stroke ruled out by MRI goal BP <220/110. PRN labetalol or hydralazine if BP above these parameters. Given patient's hx bradycardia, use labetalol in moderation. Avoid oral antihypertensives. ?- MRI brain wo contrast ?- MRA H&N ?- TTE ?- Check A1c and LDL + add statin per guidelines ?- ASA 81mg  daily + plavix 75mg  daily x21 days f/b ASA 81mg  daily monotherapy after that ?- q4 hr neuro checks ?- STAT head CT for any change in neuro exam ?- Tele ?- PT/OT/SLP ?- Stroke education ?- Amb referral to neurology upon discharge  ?  ?Stroke team will follow after patient arrives to [facility]. ?______________________________________________________________________ ? ? ?Thank you for the opportunity to take part in the care of this patient. If you have any further questions, please contact the neurology consultation attending. ? ?Signed, ? ?Su Monks, MD ?[facility] ?[phone] ? ?If 7pm- 7am, please page neurology on call as listed in [place]. ? ?

## 2021-05-30 NOTE — ED Notes (Signed)
Herbert Seta, RN on teleneurology with patient ?

## 2021-05-30 NOTE — ED Notes (Signed)
Patient to room being triaged. Wife and family at bedside. Patient was able to ambulate to the room with unassisted.  ?

## 2021-05-31 ENCOUNTER — Encounter (HOSPITAL_BASED_OUTPATIENT_CLINIC_OR_DEPARTMENT_OTHER): Payer: Self-pay

## 2021-05-31 ENCOUNTER — Observation Stay (HOSPITAL_COMMUNITY): Payer: Medicare Other

## 2021-05-31 ENCOUNTER — Other Ambulatory Visit: Payer: Self-pay

## 2021-05-31 DIAGNOSIS — Z951 Presence of aortocoronary bypass graft: Secondary | ICD-10-CM | POA: Diagnosis not present

## 2021-05-31 DIAGNOSIS — G459 Transient cerebral ischemic attack, unspecified: Secondary | ICD-10-CM | POA: Diagnosis not present

## 2021-05-31 DIAGNOSIS — I10 Essential (primary) hypertension: Secondary | ICD-10-CM | POA: Diagnosis not present

## 2021-05-31 DIAGNOSIS — E785 Hyperlipidemia, unspecified: Secondary | ICD-10-CM | POA: Diagnosis not present

## 2021-05-31 DIAGNOSIS — T50916A Underdosing of multiple unspecified drugs, medicaments and biological substances, initial encounter: Secondary | ICD-10-CM | POA: Diagnosis not present

## 2021-05-31 DIAGNOSIS — I110 Hypertensive heart disease with heart failure: Secondary | ICD-10-CM | POA: Diagnosis not present

## 2021-05-31 DIAGNOSIS — Z87891 Personal history of nicotine dependence: Secondary | ICD-10-CM | POA: Diagnosis not present

## 2021-05-31 DIAGNOSIS — R4701 Aphasia: Secondary | ICD-10-CM | POA: Diagnosis present

## 2021-05-31 DIAGNOSIS — N179 Acute kidney failure, unspecified: Secondary | ICD-10-CM | POA: Diagnosis not present

## 2021-05-31 DIAGNOSIS — R2681 Unsteadiness on feet: Secondary | ICD-10-CM | POA: Diagnosis not present

## 2021-05-31 DIAGNOSIS — R471 Dysarthria and anarthria: Secondary | ICD-10-CM | POA: Diagnosis not present

## 2021-05-31 DIAGNOSIS — Z8249 Family history of ischemic heart disease and other diseases of the circulatory system: Secondary | ICD-10-CM | POA: Diagnosis not present

## 2021-05-31 DIAGNOSIS — I6782 Cerebral ischemia: Secondary | ICD-10-CM | POA: Diagnosis not present

## 2021-05-31 DIAGNOSIS — R29701 NIHSS score 1: Secondary | ICD-10-CM | POA: Diagnosis not present

## 2021-05-31 DIAGNOSIS — Z91148 Patient's other noncompliance with medication regimen for other reason: Secondary | ICD-10-CM | POA: Diagnosis not present

## 2021-05-31 DIAGNOSIS — Z7982 Long term (current) use of aspirin: Secondary | ICD-10-CM | POA: Diagnosis not present

## 2021-05-31 DIAGNOSIS — I2510 Atherosclerotic heart disease of native coronary artery without angina pectoris: Secondary | ICD-10-CM

## 2021-05-31 DIAGNOSIS — D72829 Elevated white blood cell count, unspecified: Secondary | ICD-10-CM | POA: Diagnosis not present

## 2021-05-31 DIAGNOSIS — N400 Enlarged prostate without lower urinary tract symptoms: Secondary | ICD-10-CM | POA: Diagnosis not present

## 2021-05-31 DIAGNOSIS — Z79899 Other long term (current) drug therapy: Secondary | ICD-10-CM | POA: Diagnosis not present

## 2021-05-31 DIAGNOSIS — Z6831 Body mass index (BMI) 31.0-31.9, adult: Secondary | ICD-10-CM | POA: Diagnosis not present

## 2021-05-31 DIAGNOSIS — R001 Bradycardia, unspecified: Secondary | ICD-10-CM | POA: Diagnosis not present

## 2021-05-31 DIAGNOSIS — Z91128 Patient's intentional underdosing of medication regimen for other reason: Secondary | ICD-10-CM | POA: Diagnosis not present

## 2021-05-31 DIAGNOSIS — E669 Obesity, unspecified: Secondary | ICD-10-CM | POA: Diagnosis not present

## 2021-05-31 DIAGNOSIS — R29810 Facial weakness: Secondary | ICD-10-CM | POA: Diagnosis not present

## 2021-05-31 LAB — CBC
HCT: 47.8 % (ref 39.0–52.0)
Hemoglobin: 15.6 g/dL (ref 13.0–17.0)
MCH: 29.3 pg (ref 26.0–34.0)
MCHC: 32.6 g/dL (ref 30.0–36.0)
MCV: 89.8 fL (ref 80.0–100.0)
Platelets: 198 10*3/uL (ref 150–400)
RBC: 5.32 MIL/uL (ref 4.22–5.81)
RDW: 13.1 % (ref 11.5–15.5)
WBC: 7.7 10*3/uL (ref 4.0–10.5)
nRBC: 0 % (ref 0.0–0.2)

## 2021-05-31 LAB — HEMOGLOBIN A1C
Hgb A1c MFr Bld: 5.2 % (ref 4.8–5.6)
Mean Plasma Glucose: 102.54 mg/dL

## 2021-05-31 LAB — CREATININE, SERUM
Creatinine, Ser: 1.14 mg/dL (ref 0.61–1.24)
GFR, Estimated: >60 mL/min (ref >60)

## 2021-05-31 MED ORDER — ROSUVASTATIN CALCIUM 20 MG PO TABS
20.0000 mg | ORAL_TABLET | Freq: Every day | ORAL | Status: DC
  Administered 2021-05-31 – 2021-06-01 (×2): 20 mg via ORAL
  Filled 2021-05-31 (×2): qty 1

## 2021-05-31 MED ORDER — ASPIRIN 81 MG PO CHEW
81.0000 mg | CHEWABLE_TABLET | Freq: Every day | ORAL | Status: DC
  Administered 2021-05-31 – 2021-06-01 (×2): 81 mg via ORAL
  Filled 2021-05-31 (×2): qty 1

## 2021-05-31 MED ORDER — LACTATED RINGERS IV SOLN
INTRAVENOUS | Status: DC
Start: 2021-05-31 — End: 2021-06-01

## 2021-05-31 MED ORDER — ACETAMINOPHEN 650 MG RE SUPP: 650.0000 mg | RECTAL | Status: DC | PRN

## 2021-05-31 MED ORDER — ACETAMINOPHEN 160 MG/5ML PO SOLN: 650.0000 mg | ORAL | Status: DC | PRN

## 2021-05-31 MED ORDER — ACETAMINOPHEN 325 MG PO TABS: 650.0000 mg | ORAL_TABLET | ORAL | Status: DC | PRN

## 2021-05-31 MED ORDER — STROKE: EARLY STAGES OF RECOVERY BOOK
Freq: Once | Status: AC
  Filled 2021-05-31: qty 1

## 2021-05-31 MED ORDER — SENNOSIDES-DOCUSATE SODIUM 8.6-50 MG PO TABS: 1.0000 | ORAL_TABLET | Freq: Every evening | ORAL | Status: DC | PRN

## 2021-05-31 MED ORDER — HEPARIN SODIUM (PORCINE) 5000 UNIT/ML IJ SOLN
5000.0000 [IU] | Freq: Three times a day (TID) | INTRAMUSCULAR | Status: DC
  Administered 2021-05-31 – 2021-06-01 (×2): 5000 [IU] via SUBCUTANEOUS
  Filled 2021-05-31 (×2): qty 1

## 2021-05-31 MED ORDER — ONDANSETRON HCL 4 MG/2ML IJ SOLN
4.0000 mg | Freq: Four times a day (QID) | INTRAMUSCULAR | Status: DC | PRN
Start: 2021-05-31 — End: 2021-06-01

## 2021-05-31 MED ORDER — CLOPIDOGREL BISULFATE 75 MG PO TABS
75.0000 mg | ORAL_TABLET | Freq: Every day | ORAL | Status: DC
  Administered 2021-05-31 – 2021-06-01 (×2): 75 mg via ORAL
  Filled 2021-05-31 (×2): qty 1

## 2021-05-31 NOTE — ED Notes (Signed)
Patient is asleep.  

## 2021-05-31 NOTE — ED Notes (Signed)
Pt eating meal; tolerating well ?

## 2021-05-31 NOTE — Progress Notes (Addendum)
Disregard note.

## 2021-05-31 NOTE — ED Notes (Signed)
Patient ambulated to the bathroom with assistance. Gait was steady.  ?

## 2021-05-31 NOTE — H&P (Addendum)
TRH H&P   Patient Demographics:    Jeffery Barr, is a 81 y.o. male  MRN: [id number]   DOB - [birth year]  Admit Date - 05/30/2021  Outpatient Primary MD for the patient is [name], MD    Patient coming from: [facility]  Chief Complaint  Patient presents with   Aphasia   Extremity Weakness      HPI:    Jeffery Barr  is a 81 y.o. male, with history of CAD s/p CABG around 10 years ago, hypertension, dyslipidemia, BPH, sinus bradycardia who lives at home and is pretty active, according to the patient he stopped taking all his medicines 2 years ago because he was taking same medicines for 18 years and did not think that they were needed anymore.  Yesterday while having dinner he noticed that he was dropping spoon and food out of his right hand and he was also found to have some word finding difficulties by his family.  He was brought to med [facility] ER where he was seen by the telemetry neurology team, head CT was obtained was unremarkable, his blood work suggested possible AKI and he was admitted for TIA and a AKI work-up.    Review of systems:    A full 10 point Review of Systems was done, except as stated above, all other Review of Systems were negative.   With Past History of the following :    Past Medical History:  Diagnosis Date   Benign hypertensive heart disease without heart failure    Coronary atherosclerosis of native coronary artery    S/P CABG   Dyslipidemia    HTN (hypertension)    Obesity    Sinus bradycardia    a. baseline HR 50s.      Past Surgical History:  Procedure Laterality Date   CORONARY ARTERY BYPASS GRAFT     VASECTOMY        Social History:     Social  History   Tobacco Use   Smoking status: Former   Smokeless tobacco: Never  Substance Use Topics   Alcohol use: Yes    Alcohol/week: 3.0 - 5.0 standard drinks    Types: 1 - 2 Glasses of wine, 1 - 2 Cans of beer, 1 Shots of liquor per week    Comment: 1-2 glasses of wine  and beer weekly         Family History :     Family History  Problem Relation Age of Onset   Heart failure Mother    CAD Mother    Heart disease Brother    CAD Sister    CAD Other    Heart attack Other        Home Medications:   Prior to Admission medications   Medication Sig Start Date End Date Taking? Authorizing Provider  amLODipine (NORVASC) 5 MG tablet TAKE 1 TABLET BY MOUTH EVERY DAY 12/20/19   Quintella Reichert, MD  aspirin 81 MG tablet Take 81 mg by mouth daily.    [provider]  atorvastatin (LIPITOR) 80 MG tablet TAKE 1 TABLET BY MOUTH EVERY DAY 12/20/19   Quintella Reichert, MD  hydrochlorothiazide (HYDRODIURIL) 25 MG tablet TAKE 1 TABLET BY MOUTH EVERY DAY 12/20/19   Quintella Reichert, MD  metoprolol succinate (TOPROL-XL) 50 MG 24 hr tablet TAKE 1 TABLET (50 MG TOTAL) BY MOUTH DAILY. TAKE WITH OR IMMEDIATELY FOLLOWING A MEAL. 12/20/19   Quintella Reichert, MD     Allergies:    No Known Allergies   Physical Exam:   Vitals  Blood pressure (!) 157/86, pulse 64, temperature 97.7 F (36.5 C), temperature source Oral, resp. rate 18, height 5\' 9"  (1.753 m), weight 95.3 kg, SpO2 92 %.   1. General medium built elderly white male sitting in hospital bed in no apparent discomfort  2. Normal affect and insight, Not Suicidal or Homicidal, Awake Alert,   3. No F.N deficits, ALL C.Nerves Intact, Strength 5/5 all 4 extremities, Sensation intact all 4 extremities, Plantars down going.  4. Ears and Eyes appear Normal, Conjunctivae clear, PERRLA. Moist Oral Mucosa.  5. Supple Neck, No JVD, No cervical lymphadenopathy appriciated, No Carotid Bruits.  6. Symmetrical Chest wall movement, Good air  movement bilaterally, CTAB.  7. RRR, No Gallops, Rubs or Murmurs, No Parasternal Heave.  8. Positive Bowel Sounds, Abdomen Soft, No tenderness, No organomegaly appriciated,No rebound -guarding or rigidity.  9.  No Cyanosis, Normal Skin Turgor, No Skin Rash or Bruise.  10. Good muscle tone,  joints appear normal , no effusions, Normal ROM.  11. No Palpable Lymph Nodes in Neck or Axillae      Data Review:    CBC Recent Labs  Lab 05/30/21 1938  WBC 10.6*  HGB 15.4  HCT 46.2  PLT 199  MCV 88.2  MCH 29.4  MCHC 33.3  RDW 13.0  LYMPHSABS 2.6  MONOABS 0.9  EOSABS 0.2  BASOSABS 0.1   ------------------------------------------------------------------------------------------------------------------  Chemistries  Recent Labs  Lab 05/30/21 1938  NA 142  K 4.1  CL 108  CO2 27  GLUCOSE 134*  BUN 25*  CREATININE 1.54*  CALCIUM 9.6  AST 23  ALT 18  ALKPHOS 67  BILITOT 0.6   ------------------------------------------------------------------------------------------------------------------ estimated creatinine clearance is 44.3 mL/min (A) (by C-G formula based on SCr of 1.54 mg/dL (H)). ------------------------------------------------------------------------------------------------------------------ No results for input(s): TSH, T4TOTAL, T3FREE, THYROIDAB in the last 72 hours.  Invalid input(s): FREET3  Coagulation profile Recent Labs  Lab 05/30/21 2014  INR 1.1   ------------------------------------------------------------------------------------------------------------------- No results for input(s): DDIMER in the last 72 hours. -------------------------------------------------------------------------------------------------------------------  Cardiac Enzymes No results for input(s): CKMB, TROPONINI, MYOGLOBIN in the last 168 hours.  Invalid input(s):  CK ------------------------------------------------------------------------------------------------------------------ No results found for: BNP   ---------------------------------------------------------------------------------------------------------------  Urinalysis No results found for: COLORURINE, APPEARANCEUR, LABSPEC, PHURINE, GLUCOSEU, HGBUR, BILIRUBINUR,  Vance Gather, UROBILINOGEN, NITRITE, LEUKOCYTESUR  ----------------------------------------------------------------------------------------------------------------   Imaging Results:    CT HEAD CODE STROKE WO CONTRAST`  Result Date: 05/30/2021 CLINICAL DATA:  Code stroke. Acute neuro deficit. Expressive aphasia unsteady gait. Right-sided weakness EXAM: CT HEAD WITHOUT CONTRAST TECHNIQUE: Contiguous axial images were obtained from the base of the skull through the vertex without intravenous contrast. RADIATION DOSE REDUCTION: This exam was performed according to the departmental dose-optimization program which includes automated exposure control, adjustment of the mA and/or kV according to patient size and/or use of iterative reconstruction technique. COMPARISON:  None. FINDINGS: Brain: Mild atrophy. Mild patchy white matter hypodensity bilaterally appears chronic. Negative for acute infarct, hemorrhage, mass. Negative for hydrocephalus Vascular: Negative for hyperdense vessel Skull: Negative Sinuses/Orbits: Mild mucosal edema paranasal negative orbit Other: None ASPECTS ([place] Stroke Program Early CT Score) - Ganglionic level infarction (caudate, lentiform nuclei, internal capsule, insula, M1-M3 cortex): 7 - Supraganglionic infarction (M4-M6 cortex): 3 Total score (0-10 with 10 being normal): 10 IMPRESSION: 1. No acute intracranial abnormality 2. ASPECTS is 10 3. Code stroke imaging results were communicated on 05/30/2021 at 7:50 pm to provider Silverio Lay via telephone Electronically Signed   By: Marlan Palau M.D.   On: 05/30/2021 19:52     My personal review of EKG: Rhythm NSR, no acute ST changes   Assessment & Plan:   1.  TIA versus stroke.  Had right sided transient weakness and dysarthria symptoms largely resolved, head CT unremarkable, he will be placed on aspirin and statin, full stroke work-up, neuro team to evaluate.  He has been seen by teleneurology before.  2.  Noncompliance with all of his medications for the last 2 years.  Counseled on compliance  3.  Dyslipidemia.  Replaced on statin.  4.  Hypertension.  For now permissive hypertension  5.  CAD s/p CABG.  No acute issues aspirin and statin for secondary prevention for now.    DVT Prophylaxis Heparin   AM Labs Ordered, also please review Full Orders  Family Communication: Admission, patients condition and plan of care including tests being ordered have been discussed with the patient who indicates understanding and agree with the plan and Code Status.  Code Status Full  Likely DC to  Home  Condition Fair  Consults called: Neuro    Admission status: Obs    Time spent in minutes : 35   Susa Raring M.D on 05/31/2021 at 5:14 PM  To page go to [email or web address] - password [email or web address]

## 2021-05-31 NOTE — ED Notes (Signed)
Patients wife no longer at bedside. Patient laying in bed attempting to go to sleep.  ?

## 2021-06-01 ENCOUNTER — Observation Stay (HOSPITAL_BASED_OUTPATIENT_CLINIC_OR_DEPARTMENT_OTHER): Payer: Medicare Other

## 2021-06-01 DIAGNOSIS — G459 Transient cerebral ischemic attack, unspecified: Secondary | ICD-10-CM | POA: Diagnosis not present

## 2021-06-01 DIAGNOSIS — I639 Cerebral infarction, unspecified: Secondary | ICD-10-CM

## 2021-06-01 LAB — COMPREHENSIVE METABOLIC PANEL
ALT: 15 U/L (ref 0–44)
AST: 13 U/L — ABNORMAL LOW (ref 15–41)
Albumin: 3.1 g/dL — ABNORMAL LOW (ref 3.5–5.0)
Alkaline Phosphatase: 45 U/L (ref 38–126)
Anion gap: 4 — ABNORMAL LOW (ref 5–15)
BUN: 17 mg/dL (ref 8–23)
CO2: 25 mmol/L (ref 22–32)
Calcium: 8.6 mg/dL — ABNORMAL LOW (ref 8.9–10.3)
Chloride: 110 mmol/L (ref 98–111)
Creatinine, Ser: 1.12 mg/dL (ref 0.61–1.24)
GFR, Estimated: >60 mL/min (ref >60)
Glucose, Bld: 87 mg/dL (ref 70–99)
Potassium: 4 mmol/L (ref 3.5–5.1)
Sodium: 139 mmol/L (ref 135–145)
Total Bilirubin: 0.6 mg/dL (ref 0.3–1.2)
Total Protein: 5.6 g/dL — ABNORMAL LOW (ref 6.5–8.1)

## 2021-06-01 LAB — ECHOCARDIOGRAM COMPLETE
AR max vel: 3.14 cm2
AV Peak grad: 7.3 mmHg
Ao pk vel: 1.35 m/s
Area-P 1/2: 4.46 cm2
Height: 69 in
S' Lateral: 3.4 cm
Weight: 3363.2 oz

## 2021-06-01 LAB — BRAIN NATRIURETIC PEPTIDE: B Natriuretic Peptide: 194.2 pg/mL — ABNORMAL HIGH (ref 0.0–100.0)

## 2021-06-01 LAB — CBC WITH DIFFERENTIAL/PLATELET
Abs Immature Granulocytes: 0.02 10*3/uL (ref 0.00–0.07)
Basophils Absolute: 0 10*3/uL (ref 0.0–0.1)
Basophils Relative: 1 %
Eosinophils Absolute: 0.2 10*3/uL (ref 0.0–0.5)
Eosinophils Relative: 3 %
HCT: 40.2 % (ref 39.0–52.0)
Hemoglobin: 13.2 g/dL (ref 13.0–17.0)
Immature Granulocytes: 0 %
Lymphocytes Relative: 29 %
Lymphs Abs: 2.2 10*3/uL (ref 0.7–4.0)
MCH: 29.4 pg (ref 26.0–34.0)
MCHC: 32.8 g/dL (ref 30.0–36.0)
MCV: 89.5 fL (ref 80.0–100.0)
Monocytes Absolute: 0.6 10*3/uL (ref 0.1–1.0)
Monocytes Relative: 8 %
Neutro Abs: 4.5 10*3/uL (ref 1.7–7.7)
Neutrophils Relative %: 59 %
Platelets: 172 10*3/uL (ref 150–400)
RBC: 4.49 MIL/uL (ref 4.22–5.81)
RDW: 12.8 % (ref 11.5–15.5)
WBC: 7.6 10*3/uL (ref 4.0–10.5)
nRBC: 0 % (ref 0.0–0.2)

## 2021-06-01 LAB — LIPID PANEL
Cholesterol: 168 mg/dL (ref 0–200)
HDL: 38 mg/dL — ABNORMAL LOW (ref >40)
LDL Cholesterol: 111 mg/dL — ABNORMAL HIGH (ref 0–99)
Total CHOL/HDL Ratio: 4.4 RATIO
Triglycerides: 93 mg/dL (ref <150)
VLDL: 19 mg/dL (ref 0–40)

## 2021-06-01 MED ORDER — CARVEDILOL 3.125 MG PO TABS: 3.1250 mg | ORAL_TABLET | Freq: Two times a day (BID) | ORAL | 0 refills | Status: DC

## 2021-06-01 MED ORDER — TAMSULOSIN HCL 0.4 MG PO CAPS: 0.4000 mg | ORAL_CAPSULE | Freq: Every day | ORAL | 0 refills | Status: DC

## 2021-06-01 MED ORDER — EZETIMIBE 10 MG PO TABS: 10.0000 mg | ORAL_TABLET | Freq: Every day | ORAL | 0 refills | Status: DC

## 2021-06-01 MED ORDER — ROSUVASTATIN CALCIUM 20 MG PO TABS: 20.0000 mg | ORAL_TABLET | Freq: Every day | ORAL | 0 refills | Status: DC

## 2021-06-01 MED ORDER — CLOPIDOGREL BISULFATE 75 MG PO TABS: 75.0000 mg | ORAL_TABLET | Freq: Every day | ORAL | 0 refills | Status: DC

## 2021-06-01 MED ORDER — ROSUVASTATIN CALCIUM 40 MG PO TABS: 40.0000 mg | ORAL_TABLET | Freq: Every day | ORAL | 0 refills | Status: DC

## 2021-06-01 NOTE — Consult Note (Addendum)
STROKE TEAM PROGRESS NOTE  ? ?INTERVAL HISTORY ?Jeffery Barr is a 81 yo man with hx CAD, HTN, HL, sinus bradycardia presents after acute onset word finding difficulty, dysarthria, and R sided weakness. LKW 1840 on 4/12. Experienced rapid improvement in symptoms and by the time he was in the ED he had only mild dysarthria, NIHSS of 1. MRI and vessel imaging unremarkable.  ? ?The patient is seen in his room this morning, with no family at bedside. Patient confirms the history above but mentions prominent dizziness that resolved with his other symptoms. Denies being on any antiplatelet/anticoagulation prior to admission.   ? ?Vitals:  ? 05/31/21 2334 06/01/21 0330 06/01/21 0820 06/01/21 1231  ?BP: (!) 140/44 132/79 (!) 149/66 (!) 162/52  ?Pulse: 61 (!) 59 61   ?Resp: 17 15 17 15   ?Temp: 97.7 ?F (36.5 ?C) 97.8 ?F (36.6 ?C) 98.2 ?F (36.8 ?C) 98.6 ?F (37 ?C)  ?TempSrc: Oral Oral Oral Oral  ?SpO2: 96% 95% 96%   ?Weight:      ?Height:      ? ?CBC:  ?Recent Labs  ?Lab 05/30/21 ?1938 05/31/21 ?1856 06/01/21 ?[id number]  ?WBC 10.6* 7.7 7.6  ?NEUTROABS 6.8  --  4.5  ?HGB 15.4 15.6 13.2  ?HCT 46.2 47.8 40.2  ?MCV 88.2 89.8 89.5  ?PLT 199 198 172  ? ?Basic Metabolic Panel:  ?Recent Labs  ?Lab 05/30/21 ?1938 05/31/21 ?1856 06/01/21 ?[id number]  ?NA 142  --  139  ?K 4.1  --  4.0  ?CL 108  --  110  ?CO2 27  --  25  ?GLUCOSE 134*  --  87  ?BUN 25*  --  17  ?CREATININE 1.54* 1.14 1.12  ?CALCIUM 9.6  --  8.6*  ? ?Lipid Panel:  ?Recent Labs  ?Lab 06/01/21 ?[id number]  ?CHOL 168  ?TRIG 93  ?HDL 38*  ?CHOLHDL 4.4  ?VLDL 19  ?LDLCALC 111*  ? ?HgbA1c:  ?Recent Labs  ?Lab 05/31/21 ?1856  ?HGBA1C 5.2  ? ?Urine Drug Screen: No results for input(s): LABOPIA, COCAINSCRNUR, LABBENZ, AMPHETMU, THCU, LABBARB in the last 168 hours.  ?Alcohol Level No results for input(s): ETH in the last 168 hours. ? ?IMAGING past 24 hours ?MR ANGIO HEAD WO CONTRAST ? ?Result Date: 05/31/2021 ?CLINICAL DATA:  Transient ischemic attack (TIA); Neuro deficit, acute, stroke suspected  EXAM: MRI HEAD WITHOUT CONTRAST MRA HEAD WITHOUT CONTRAST TECHNIQUE: Multiplanar, multi-echo pulse sequences of the brain and surrounding structures were acquired without intravenous contrast. Angiographic images of the Circle of Willis were acquired using MRA technique without intravenous contrast. COMPARISON:  No pertinent prior exam. FINDINGS: MRI HEAD Brain: There is no acute infarction or intracranial hemorrhage. There is no intracranial mass, mass effect, or edema. There is no hydrocephalus or extra-axial fluid collection. Prominence of the ventricles and sulci reflects mild parenchymal volume loss. Patchy and confluent areas of T2 hyperintensity in the supratentorial white matter nonspecific but may reflect mild to moderate chronic microvascular ischemic changes. Vascular: Major vessel flow voids at the skull base are preserved. Skull and upper cervical spine: Normal marrow signal is preserved. . Sinuses/Orbits: Paranasal sinuses are aerated. Slightly expansile focal T2 hyperintensity at the right olfactory cleft may reflect a polyp or hamartoma orbits are unremarkable. Other: Sella is unremarkable.  Mastoid air cells are clear. MRA HEAD Intracranial internal carotid arteries are patent with atherosclerotic irregularity. Middle and anterior cerebral arteries are patent. Intracranial vertebral arteries, basilar artery, posterior cerebral arteries are patent. There is no significant stenosis or aneurysm.  IMPRESSION: No evidence of recent infarction, hemorrhage, or mass. Mild to moderate chronic microvascular ischemic changes. No proximal intracranial vessel occlusion or significant stenosis. Electronically Signed   By: Macy Mis M.D.   On: 05/31/2021 19:10  ? ?MR BRAIN WO CONTRAST ? ?Result Date: 05/31/2021 ?CLINICAL DATA:  Transient ischemic attack (TIA); Neuro deficit, acute, stroke suspected EXAM: MRI HEAD WITHOUT CONTRAST MRA HEAD WITHOUT CONTRAST TECHNIQUE: Multiplanar, multi-echo pulse sequences of  the brain and surrounding structures were acquired without intravenous contrast. Angiographic images of the Circle of Willis were acquired using MRA technique without intravenous contrast. COMPARISON:  No pertinent prior exam. FINDINGS: MRI HEAD Brain: There is no acute infarction or intracranial hemorrhage. There is no intracranial mass, mass effect, or edema. There is no hydrocephalus or extra-axial fluid collection. Prominence of the ventricles and sulci reflects mild parenchymal volume loss. Patchy and confluent areas of T2 hyperintensity in the supratentorial white matter nonspecific but may reflect mild to moderate chronic microvascular ischemic changes. Vascular: Major vessel flow voids at the skull base are preserved. Skull and upper cervical spine: Normal marrow signal is preserved. . Sinuses/Orbits: Paranasal sinuses are aerated. Slightly expansile focal T2 hyperintensity at the right olfactory cleft may reflect a polyp or hamartoma orbits are unremarkable. Other: Sella is unremarkable.  Mastoid air cells are clear. MRA HEAD Intracranial internal carotid arteries are patent with atherosclerotic irregularity. Middle and anterior cerebral arteries are patent. Intracranial vertebral arteries, basilar artery, posterior cerebral arteries are patent. There is no significant stenosis or aneurysm. IMPRESSION: No evidence of recent infarction, hemorrhage, or mass. Mild to moderate chronic microvascular ischemic changes. No proximal intracranial vessel occlusion or significant stenosis. Electronically Signed   By: Macy Mis M.D.   On: 05/31/2021 19:10  ? ?ECHOCARDIOGRAM COMPLETE ? ?Result Date: 06/01/2021 ?   ECHOCARDIOGRAM REPORT   Patient Name:   Jeffery Barr Date of Exam: 06/01/2021 Medical Rec #:  [id number]        Height:       69.0 in Accession #:    [id number]       Weight:       210.2 lb Date of Birth:  1940-11-28         BSA:          2.110 m? Patient Age:    89 years         BP:           149/66 mmHg  Patient Gender: M                HR:           72 bpm. Exam Location:  Inpatient Procedure: 2D Echo Indications:    CHF  History:        Patient has no prior history of Echocardiogram examinations.                 CAD; Risk Factors:Hypertension.  Sonographer:    Jefferey Pica Referring Phys: [place]  1. Left ventricular ejection fraction, by estimation, is 60 to 65%. The left ventricle has normal function. The left ventricle has no regional wall motion abnormalities. There is moderate left ventricular hypertrophy. Left ventricular diastolic parameters were normal.  2. Right ventricular systolic function is mildly reduced. The right ventricular size is normal. There is mildly elevated pulmonary artery systolic pressure.  3. The mitral valve is normal in structure. No evidence of mitral valve regurgitation.  4. The aortic valve is normal in structure.  Aortic valve regurgitation is not visualized. No aortic stenosis is present. FINDINGS  Left Ventricle: Left ventricular ejection fraction, by estimation, is 60 to 65%. The left ventricle has normal function. The left ventricle has no regional wall motion abnormalities. The left ventricular internal cavity size was normal in size. There is  moderate left ventricular hypertrophy. Left ventricular diastolic parameters were normal. Right Ventricle: The right ventricular size is normal. Right vetricular wall thickness was not well visualized. Right ventricular systolic function is mildly reduced. There is mildly elevated pulmonary artery systolic pressure. The tricuspid regurgitant velocity is 2.32 m/s, and with an assumed right atrial pressure of 15 mmHg, the estimated right ventricular systolic pressure is [id number] mmHg. Left Atrium: Left atrial size was normal in size. Right Atrium: Right atrial size was normal in size. Pericardium: There is no evidence of pericardial effusion. Mitral Valve: The mitral valve is normal in structure. No evidence of  mitral valve regurgitation. Tricuspid Valve: The tricuspid valve is normal in structure. Tricuspid valve regurgitation is trivial. Aortic Valve: The aortic valve is normal in structure. Aortic valve regurgi

## 2021-06-01 NOTE — Progress Notes (Signed)
Carotid duplex has been completed.  ? ?Preliminary results in CV Proc.  ? ?Jeymi Hepp Beatris Belen ?06/01/2021 8:30 AM    ?

## 2021-06-01 NOTE — Discharge Instructions (Signed)
Follow with Primary MD [name], MD in 7 days  ? ?Get CBC, CMP, 2 view Chest X ray -  checked next visit within 1 week by Primary MD  ? ?Activity: As tolerated with Full fall precautions use walker/cane & assistance as needed ? ?Disposition Home   ? ?Diet: Heart Healthy   ? ?Special Instructions: If you have smoked or chewed Tobacco  in the last 2 yrs please stop smoking, stop any regular Alcohol  and or any Recreational drug use. ? ?On your next visit with your primary care physician please Get Medicines reviewed and adjusted. ? ?Please request your Prim.MD to go over all Hospital Tests and Procedure/Radiological results at the follow up, please get all Hospital records sent to your Prim MD by signing hospital release before you go home. ? ?If you experience worsening of your admission symptoms, develop shortness of breath, life threatening emergency, suicidal or homicidal thoughts you must seek medical attention immediately by calling 911 or calling your MD immediately  if symptoms less severe. ? ?You Must read complete instructions/literature along with all the possible adverse reactions/side effects for all the Medicines you take and that have been prescribed to you. Take any new Medicines after you have completely understood and accpet all the possible adverse reactions/side effects.  ? ?  ?

## 2021-06-01 NOTE — Discharge Summary (Signed)
?                                                                                ? Jeffery Barr [id number] DOB: [birth year] DOA: 05/30/2021 ? ?PCP: [name], MD ? ?Admit date: 05/30/2021  Discharge date: 06/01/2021 ? ?Admitted From: Home   Disposition:  Home ? ? ?Recommendations for Outpatient Follow-up:  ? ?Follow up with PCP in 1-2 weeks ? ?PCP Please obtain BMP/CBC, 2 view CXR in 1week,  (see Discharge instructions)  ? ?PCP Please follow up on the following pending results:  ? ? ?Home Health: None   ?Equipment/Devices: None  ?Consultations: Neuro ?Discharge Condition: Stable    ?CODE STATUS: Full    ?Diet Recommendation: Heart Healthy  ?  ? ?Chief Complaint  ?Patient presents with  ? Aphasia  ? Extremity Weakness  ?  ? ?Brief history of present illness from the day of admission and additional interim summary   ? ?81 y.o. male, with history of CAD s/p CABG around 10 years ago, hypertension, dyslipidemia, BPH, sinus bradycardia who lives at home and is pretty active, according to the patient he stopped taking all his medicines 2 years ago because he was taking same medicines for 18 years and did not think that they were needed anymore.  Yesterday while having dinner he noticed that he was dropping spoon and food out of his right hand and he was also found to have some word finding difficulties by his family.  He was brought to med [facility] ER where he was seen by the telemetry neurology team, head CT was obtained was unremarkable, his blood work suggested possible AKI and he was admitted for TIA and a AKI work-up. ? ?                                                               Hospital Course  ? ? ?1.  TIA versus stroke.  Had right sided transient weakness and dysarthria symptoms largely resolved, head CT, MRI - A Brain, echocardiogram and carotid duplex nonacute, he will be placed on aspirin, statin and  Zetia for long-term, Plavix for 21 days, Tums completely resolved, case discussed with Dr. Pearlean Brownie stroke team in detail today. ?  ?2.  Noncompliance with all of his medications for the last 2 years.  Counseled on compliance, PCP to monitor. ?  ?3.  Dyslipidemia.  Replaced on statin, added Zetia. ?  ?4.  Hypertension.  Was not taking any medications for 2 years low-dose Coreg for now PCP to monitor and adjust. ?  ?5.  CAD s/p CABG.  No acute issues Coreg, aspirin and statin for secondary prevention for now. ? ? ?Discharge diagnosis   ? ? ?Principal Problem: ?  TIA (transient ischemic attack) ?Active Problems: ?  CAD (coronary artery disease) ?  HTN (hypertension) ?  Obesity (BMI 30-39.9) ?  Dyslipidemia ? ? ? ?Discharge instructions   ? ?Discharge Instructions   ? ?  Diet - low sodium heart healthy   Complete by: As directed ?  ? Discharge instructions   Complete by: As directed ?  ? Follow with Primary MD [name], MD in 7 days  ? ?Get CBC, CMP, 2 view Chest X ray -  checked next visit within 1 week by Primary MD  ? ?Activity: As tolerated with Full fall precautions use walker/cane & assistance as needed ? ?Disposition Home   ? ?Diet: Heart Healthy   ? ?Special Instructions: If you have smoked or chewed Tobacco  in the last 2 yrs please stop smoking, stop any regular Alcohol  and or any Recreational drug use. ? ?On your next visit with your primary care physician please Get Medicines reviewed and adjusted. ? ?Please request your Prim.MD to go over all Hospital Tests and Procedure/Radiological results at the follow up, please get all Hospital records sent to your Prim MD by signing hospital release before you go home. ? ?If you experience worsening of your admission symptoms, develop shortness of breath, life threatening emergency, suicidal or homicidal thoughts you must seek medical attention immediately by calling 911 or calling your MD immediately  if symptoms less severe. ? ?You Must read complete  instructions/literature along with all the possible adverse reactions/side effects for all the Medicines you take and that have been prescribed to you. Take any new Medicines after you have completely understood and accpet all the possible adverse reactions/side effects.  ? Increase activity slowly   Complete by: As directed ?  ? ?  ? ? ?Discharge Medications  ? ?Allergies as of 06/01/2021   ?No Known Allergies ?  ? ?  ?Medication List  ?  ? ?STOP taking these medications   ? ?amLODipine 5 MG tablet ?Commonly known as: NORVASC ?  ?atorvastatin 80 MG tablet ?Commonly known as: LIPITOR ?  ?metoprolol succinate 50 MG 24 hr tablet ?Commonly known as: TOPROL-XL ?  ? ?  ? ?TAKE these medications   ? ?aspirin 81 MG tablet ?Take 81 mg by mouth daily. ?  ?carvedilol 3.125 MG tablet ?Commonly known as: Coreg ?Take 1 tablet (3.125 mg total) by mouth 2 (two) times daily. ?  ?clopidogrel 75 MG tablet ?Commonly known as: PLAVIX ?Take 1 tablet (75 mg total) by mouth daily. ?Start taking on: June 02, 2021 ?  ?ezetimibe 10 MG tablet ?Commonly known as: Zetia ?Take 1 tablet (10 mg total) by mouth daily. ?  ?hydrochlorothiazide 25 MG tablet ?Commonly known as: HYDRODIURIL ?TAKE 1 TABLET BY MOUTH EVERY DAY ?  ?rosuvastatin 20 MG tablet ?Commonly known as: CRESTOR ?Take 1 tablet (20 mg total) by mouth daily. ?Start taking on: June 02, 2021 ?  ? ?  ? ? ? Follow-up Information   ? ? [name], MD. Schedule an appointment as soon as possible for a visit in 1 week(s).   ?Specialty: Family Medicine ?Contact information: ?[address] [address] [facility] [address] [facility] [address] ?[place] [address] ?[phone] ? ? ?  ?  ? ? Quintella Reichert, MD .   ?Specialty: Cardiology ?Contact information: ?[address] ?[address] ?[place] [address] ?[phone] ? ? ?  ?  ? ? [facility]. Schedule an appointment as soon as possible for a visit in 1 month(s).   ?Contact information: ?[address]     [address] ?[place]  [address] ?[phone] ? ?  ?  ? ?  ?  ? ?  ? ? ?Major procedures and Radiology Reports - PLEASE  review detailed and final reports thoroughly  -    ? ?  ?MR ANGIO HEAD WO CONTRAST ? ?Result Date: 05/31/2021 ?CLINICAL DATA:  Transient ischemic attack (TIA); Neuro deficit, acute, stroke suspected EXAM: MRI HEAD WITHOUT CONTRAST MRA HEAD WITHOUT CONTRAST TECHNIQUE: Multiplanar, multi-echo pulse sequences of the brain and surrounding structures were acquired without intravenous contrast. Angiographic images of the Circle of Willis were acquired using MRA technique without intravenous contrast. COMPARISON:  No pertinent prior exam. FINDINGS: MRI HEAD Brain: There is no acute infarction or intracranial hemorrhage. There is no intracranial mass, mass effect, or edema. There is no hydrocephalus or extra-axial fluid collection. Prominence of the ventricles and sulci reflects mild parenchymal volume loss. Patchy and confluent areas of T2 hyperintensity in the supratentorial white matter nonspecific but may reflect mild to moderate chronic microvascular ischemic changes. Vascular: Major vessel flow voids at the skull base are preserved. Skull and upper cervical spine: Normal marrow signal is preserved. . Sinuses/Orbits: Paranasal sinuses are aerated. Slightly expansile focal T2 hyperintensity at the right olfactory cleft may reflect a polyp or hamartoma orbits are unremarkable. Other: Sella is unremarkable.  Mastoid air cells are clear. MRA HEAD Intracranial internal carotid arteries are patent with atherosclerotic irregularity. Middle and anterior cerebral arteries are patent. Intracranial vertebral arteries, basilar artery, posterior cerebral arteries are patent. There is no significant stenosis or aneurysm. IMPRESSION: No evidence of recent infarction, hemorrhage, or mass. Mild to moderate chronic microvascular ischemic changes. No proximal intracranial vessel occlusion or significant stenosis. Electronically Signed   By:  Guadlupe SpanishPraneil  Patel M.D.   On: 05/31/2021 19:10  ? ?MR BRAIN WO CONTRAST ? ?Result Date: 05/31/2021 ?CLINICAL DATA:  Transient ischemic attack (TIA); Neuro deficit, acute, stroke suspected EXAM: MRI HEAD WITHOUT CONTRAST MRA HEAD

## 2021-06-01 NOTE — Progress Notes (Signed)
PT Cancellation Note ? ?Patient Details ?Name: Jeffery Barr ?MRN: [id number] ?DOB: 1940/05/31 ? ? ?Cancelled Treatment:    Reason Eval/Treat Not Completed: Other (comment).  Pt is declining PT politely stating he has walked all over the hosp to get to each unit and location, not noticing any changes in mobility.  Encouraged him to ask nursing to contact PT if this changes. ? ? ?[name] ?06/01/2021, 10:20 AM ? ?Samul Dada, PT PhD ?Acute Rehab Dept. Number: [facility] [phone] and [facility] [phone] ? ?

## 2021-06-01 NOTE — Progress Notes (Signed)
SLP Cancellation Note ? ?Patient Details ?Name: Jeffery Barr ?MRN: [id number] ?DOB: 1940/05/07 ? ? ?Cancelled treatment:       Reason Eval/Treat Not Completed: SLP screened, no needs identified, will sign off; pt denied dysphagia and nursing confirmed; pt passed [facility] swallow screen; ST will s/o at this time. ? ? ?Tressie Stalker, M.S., CCC-SLP ?06/01/2021, 12:29 PM ?

## 2021-06-14 DIAGNOSIS — Z8673 Personal history of transient ischemic attack (TIA), and cerebral infarction without residual deficits: Secondary | ICD-10-CM | POA: Diagnosis not present

## 2021-06-14 DIAGNOSIS — I10 Essential (primary) hypertension: Secondary | ICD-10-CM | POA: Diagnosis not present

## 2021-06-14 DIAGNOSIS — I2510 Atherosclerotic heart disease of native coronary artery without angina pectoris: Secondary | ICD-10-CM | POA: Diagnosis not present

## 2021-06-19 NOTE — Progress Notes (Signed)
? ?NEUROLOGY CONSULTATION NOTE ? ?Jeffery Barr ?MRN: [id number] ?DOB: Jan 08, 1941 ? ?Referring provider: Orpah Melter, MD ?Primary care provider: Orpah Melter, MD ? ?Reason for consult:  TIA ? ?Assessment/Plan:  ? ?Transient ischemic attack involving the left cerebral hemisphere likely secondary to small vessel disease ?Hypertension ?Hyperlipidemia ? ? ?1  Secondary stroke prevention as managed by PCP: ? - Continue ASA 81mg  daily.  Discontinue Plavix at end of week. ? - Statin therapy.  LDL goal less than 70 ? - Normotensive blood pressure - follow up with PCP as it is elevated today ? - Hgb A1c goal less than 7 ?2  Smoking cessation ?3  Mediterranean diet ?4  Routine exercise ?5  Follow up 6 months. ? ? ?Subjective:  ?Jeffery Barr is a 81 year old right-handed male with HTN, dyslipidemia, sinus bradycardia and CAD who presents for transient ischemic attack.  History supplemented by hospital notes.  MRI and MRA of brain personally reviewed. ? ?On 05/30/2021 patient developed acute onset of slurred speech, word-finding difficulty and right facial droop, upper extremity weakness and off-balance.  Most of symptoms resolved within 30 minutes.  Presented to [facility] where he only exhibited mild slurred speech with NIHSS of 1.  MRI of brain showed no acute infarct.  MRA of head showed no LVO or hemodynamically significant stenosis.  Carotid doppler revealed no hemodynamically significant stenosis.  TTE showed EF 60-65% with no cardiac source of embolus such as thrombus or IA shunt.  LDL was 11 and Hgb A1c was 5.2.  He was not on antithrombotic medication prior to admission and was discharged on ASA 81mg  and Plavix 75mg  DAPT for 3 weeks, followed by ASA 81mg  daily monotherapy.  His outpatient atorvastatin 80mg  was changed to rosuvastatin 20mg  daily. ? ? ?PAST MEDICAL HISTORY: ?Past Medical History:  ?Diagnosis Date  ? Benign hypertensive heart disease without heart failure   ? Coronary atherosclerosis of  native coronary artery   ? S/P CABG  ? Dyslipidemia   ? HTN (hypertension)   ? Obesity   ? Sinus bradycardia   ? a. baseline HR 50s.  ? ? ?PAST SURGICAL HISTORY: ?Past Surgical History:  ?Procedure Laterality Date  ? CORONARY ARTERY BYPASS GRAFT    ? VASECTOMY    ? ? ?MEDICATIONS: ?Current Outpatient Medications on File Prior to Visit  ?Medication Sig Dispense Refill  ? aspirin 81 MG tablet Take 81 mg by mouth daily.    ? carvedilol (COREG) 3.125 MG tablet Take 1 tablet (3.125 mg total) by mouth 2 (two) times daily. 60 tablet 0  ? clopidogrel (PLAVIX) 75 MG tablet Take 1 tablet (75 mg total) by mouth daily. 20 tablet 0  ? ezetimibe (ZETIA) 10 MG tablet Take 1 tablet (10 mg total) by mouth daily. 30 tablet 0  ? hydrochlorothiazide (HYDRODIURIL) 25 MG tablet TAKE 1 TABLET BY MOUTH EVERY DAY (Patient taking differently: Take 25 mg by mouth daily.) 90 tablet 3  ? rosuvastatin (CRESTOR) 20 MG tablet Take 1 tablet (20 mg total) by mouth daily. 30 tablet 0  ? ?No current facility-administered medications on file prior to visit.  ? ? ?ALLERGIES: ?No Known Allergies ? ?FAMILY HISTORY: ?Family History  ?Problem Relation Age of Onset  ? Heart failure Mother   ? CAD Mother   ? Heart disease Brother   ? CAD Sister   ? CAD Other   ? Heart attack Other   ? ? ?Objective:  ?Blood pressure (!) 160/70, pulse (!) 56,  height 5\' 9"  (1.753 m), weight 212 lb 12.8 oz (96.5 kg), SpO2 98 %. ?General: No acute distress.  Patient appears well-groomed.   ?Head:  Normocephalic/atraumatic ?Eyes:  fundi examined but not visualized ?Neck: supple, no paraspinal tenderness, full range of motion ?Back: No paraspinal tenderness ?Heart: regular rate and rhythm ?Lungs: Clear to auscultation bilaterally. ?Vascular: No carotid bruits. ?Neurological Exam: ?Mental status: alert and oriented to person, place, and time, recent and remote memory intact, fund of knowledge intact, attention and concentration intact, speech fluent and not dysarthric, language  intact. ?Cranial nerves: ?CN I: not tested ?CN II: pupils equal, round and reactive to light, visual fields intact ?CN III, IV, VI:  full range of motion, no nystagmus, no ptosis ?CN V: facial sensation intact. ?CN VII: upper and lower face symmetric ?CN VIII: hearing intact ?CN IX, X: gag intact, uvula midline ?CN XI: sternocleidomastoid and trapezius muscles intact ?CN XII: tongue midline ?Bulk & Tone: normal, no fasciculations. ?Motor:  muscle strength 5/5 throughout ?Sensation:  Pinprick, temperature and vibratory sensation intact. ?Deep Tendon Reflexes:  2+ throughout,  toes downgoing.   ?Finger to nose testing:  Without dysmetria.   ?Heel to shin:  Without dysmetria.   ?Gait:  Normal station and stride.  Romberg negative. ? ? ? ?Thank you for allowing me to take part in the care of this patient. ? ?Metta Clines, DO ? ?CC: Orpah Melter, MD ? ? ? ? ?

## 2021-06-20 ENCOUNTER — Encounter: Payer: Self-pay | Admitting: Neurology

## 2021-06-20 ENCOUNTER — Ambulatory Visit (INDEPENDENT_AMBULATORY_CARE_PROVIDER_SITE_OTHER): Payer: Medicare Other | Admitting: Neurology

## 2021-06-20 VITALS — BP 160/70 | HR 56 | Ht 69.0 in | Wt 212.8 lb

## 2021-06-20 DIAGNOSIS — E785 Hyperlipidemia, unspecified: Secondary | ICD-10-CM

## 2021-06-20 DIAGNOSIS — G459 Transient cerebral ischemic attack, unspecified: Secondary | ICD-10-CM

## 2021-06-20 DIAGNOSIS — I10 Essential (primary) hypertension: Secondary | ICD-10-CM | POA: Diagnosis not present

## 2021-06-20 NOTE — Patient Instructions (Signed)
Continue aspirin 81mg  daily.  Once you run out of clopidogrel, no longer continue it.   ?Continue cholesterol and blood pressure medications ?Mediterranean diet (see below) ?Routine exercise ?Follow up 6 months ? ? ? ?Mediterranean Diet ?A Mediterranean diet refers to food and lifestyle choices that are based on the traditions of countries located on the . It focuses on eating more fruits, vegetables, whole grains, beans, nuts, seeds, and heart-healthy fats, and eating less dairy, meat, eggs, and processed foods with added sugar, salt, and fat. This way of eating has been shown to help prevent certain conditions and improve outcomes for people who have chronic diseases, like kidney disease and heart disease. ?What are tips for following this plan? ?Reading food labels ?Check the serving size of packaged foods. For foods such as rice and pasta, the serving size refers to the amount of cooked product, not dry. ?Check the total fat in packaged foods. Avoid foods that have saturated fat or trans fats. ?Check the ingredient list for added sugars, such as corn syrup. ?Shopping ? ?Buy a variety of foods that offer a balanced diet, including: ?Fresh fruits and vegetables (produce). ?Grains, beans, nuts, and seeds. Some of these may be available in unpackaged forms or large amounts (in bulk). ?Fresh seafood. ?Poultry and eggs. ?Low-fat dairy products. ?Buy whole ingredients instead of prepackaged foods. ?Buy fresh fruits and vegetables in-season from local farmers markets. ?Buy plain frozen fruits and vegetables. ?If you do not have access to quality fresh seafood, buy precooked frozen shrimp or canned fish, such as tuna, salmon, or sardines. ?Stock your pantry so you always have certain foods on hand, such as olive oil, canned tuna, canned tomatoes, rice, pasta, and beans. ?Cooking ?Cook foods with extra-virgin olive oil instead of using butter or other vegetable oils. ?Have meat as a side dish, and have  vegetables or grains as your main dish. This means having meat in small portions or adding small amounts of meat to foods like pasta or stew. ?Use beans or vegetables instead of meat in common dishes like chili or lasagna. ?Experiment with different cooking methods. Try roasting, broiling, steaming, and saut?ing vegetables. ?Add frozen vegetables to soups, stews, pasta, or rice. ?Add nuts or seeds for added healthy fats and plant protein at each meal. You can add these to yogurt, salads, or vegetable dishes. ?Marinate fish or vegetables using olive oil, lemon juice, garlic, and fresh herbs. ?Meal planning ?Plan to eat one vegetarian meal one day each week. Try to work up to two vegetarian meals, if possible. ?Eat seafood two or more times a week. ?Have healthy snacks readily available, such as: ?Vegetable sticks with hummus. ?[employer] yogurt. ?Fruit and nut trail mix. ?Eat balanced meals throughout the week. This includes: ?Fruit: 2-3 servings a day. ?Vegetables: 4-5 servings a day. ?Low-fat dairy: 2 servings a day. ?Fish, poultry, or lean meat: 1 serving a day. ?Beans and legumes: 2 or more servings a week. ?Nuts and seeds: 1-2 servings a day. ?Whole grains: 6-8 servings a day. ?Extra-virgin olive oil: 3-4 servings a day. ?Limit red meat and sweets to only a few servings a month. ?Lifestyle ? ?Cook and eat meals together with your family, when possible. ?Drink enough fluid to keep your urine pale yellow. ?Be physically active every day. This includes: ?Aerobic exercise like running or swimming. ?Leisure activities like gardening, walking, or housework. ?Get 7-8 hours of sleep each night. ?If recommended by your health care provider, drink red wine in moderation. This means  1 glass a day for nonpregnant women and 2 glasses a day for men. A glass of wine equals 5 oz (150 mL). ?What foods should I eat? ?Fruits ?Apples. Apricots. Avocado. Berries. Bananas. Cherries. Dates. Figs. Grapes. Lemons. Melon. Oranges. Peaches.  Plums. Pomegranate. ?Vegetables ?Artichokes. Beets. Broccoli. Cabbage. Carrots. Eggplant. Green beans. Chard. Kale. Spinach. Onions. Leeks. Peas. Squash. Tomatoes. Peppers. Radishes. ?Grains ?Whole-grain pasta. Brown rice. Bulgur wheat. Polenta. Couscous. Whole-wheat bread. Orpah Cobb. ?Meats and other proteins ?Beans. Almonds. Sunflower seeds. Pine nuts. Peanuts. Cod. Salmon. Scallops. Shrimp. Tuna. Tilapia. Clams. Oysters. Eggs. Poultry without skin. ?Dairy ?Low-fat milk. Cheese. Greek yogurt. ?Fats and oils ?Extra-virgin olive oil. Avocado oil. Grapeseed oil. ?Beverages ?Water. Red wine. Herbal tea. ?Sweets and desserts ?Greek yogurt with honey. Baked apples. Poached pears. Trail mix. ?Seasonings and condiments ?Basil. Cilantro. Coriander. Cumin. Mint. Parsley. Sage. Rosemary. Tarragon. Garlic. Oregano. Thyme. Pepper. Balsamic vinegar. Tahini. Hummus. Tomato sauce. Olives. Mushrooms. ?The items listed above may not be a complete list of foods and beverages you can eat. Contact a dietitian for more information. ?What foods should I limit? ?This is a list of foods that should be eaten rarely or only on special occasions. ?Fruits ?Fruit canned in syrup. ?Vegetables ?Deep-fried potatoes (french fries). ?Grains ?Prepackaged pasta or rice dishes. Prepackaged cereal with added sugar. Prepackaged snacks with added sugar. ?Meats and other proteins ?Beef. Pork. Lamb. Poultry with skin. Hot dogs. Tomasa Blase. ?Dairy ?Ice cream. Sour cream. Whole milk. ?Fats and oils ?Butter. Canola oil. Vegetable oil. Beef fat (tallow). Lard. ?Beverages ?Juice. Sugar-sweetened soft drinks. Beer. Liquor and spirits. ?Sweets and desserts ?Cookies. Cakes. Pies. Candy. ?Seasonings and condiments ?Mayonnaise. Pre-made sauces and marinades. ?The items listed above may not be a complete list of foods and beverages you should limit. Contact a dietitian for more information. ?Summary ?The Mediterranean diet includes both food and lifestyle  choices. ?Eat a variety of fresh fruits and vegetables, beans, nuts, seeds, and whole grains. ?Limit the amount of red meat and sweets that you eat. ?If recommended by your health care provider, drink red wine in moderation. This means 1 glass a day for nonpregnant women and 2 glasses a day for men. A glass of wine equals 5 oz (150 mL). ?This information is not intended to replace advice given to you by your health care provider. Make sure you discuss any questions you have with your health care provider. ?Document Revised: 03/12/2019 Document Reviewed: 01/07/2019 ?Elsevier Patient Education ? 2023 Elsevier Inc. ? ?

## 2021-06-21 ENCOUNTER — Telehealth: Payer: Self-pay | Admitting: Cardiology

## 2021-06-21 NOTE — Telephone Encounter (Signed)
Pt's wife requesting to switch from Dr Mayford Knife to Dr Tomie China due to location. Patient is closer to [facility] office.  ?

## 2021-07-21 ENCOUNTER — Emergency Department (HOSPITAL_BASED_OUTPATIENT_CLINIC_OR_DEPARTMENT_OTHER): Payer: Medicare Other

## 2021-07-21 ENCOUNTER — Inpatient Hospital Stay (HOSPITAL_BASED_OUTPATIENT_CLINIC_OR_DEPARTMENT_OTHER)
Admission: EM | Admit: 2021-07-21 | Discharge: 2021-07-24 | DRG: 177 | Disposition: A | Discharge status: Home / Self Care | Payer: Medicare Other | Attending: Internal Medicine | Admitting: Internal Medicine

## 2021-07-21 ENCOUNTER — Other Ambulatory Visit: Payer: Self-pay

## 2021-07-21 ENCOUNTER — Encounter (HOSPITAL_BASED_OUTPATIENT_CLINIC_OR_DEPARTMENT_OTHER): Payer: Self-pay | Admitting: Emergency Medicine

## 2021-07-21 DIAGNOSIS — Z9852 Vasectomy status: Secondary | ICD-10-CM | POA: Diagnosis not present

## 2021-07-21 DIAGNOSIS — J9601 Acute respiratory failure with hypoxia: Secondary | ICD-10-CM | POA: Diagnosis not present

## 2021-07-21 DIAGNOSIS — Z87891 Personal history of nicotine dependence: Secondary | ICD-10-CM

## 2021-07-21 DIAGNOSIS — Z951 Presence of aortocoronary bypass graft: Secondary | ICD-10-CM

## 2021-07-21 DIAGNOSIS — E785 Hyperlipidemia, unspecified: Secondary | ICD-10-CM | POA: Diagnosis not present

## 2021-07-21 DIAGNOSIS — N3289 Other specified disorders of bladder: Secondary | ICD-10-CM | POA: Diagnosis not present

## 2021-07-21 DIAGNOSIS — E860 Dehydration: Secondary | ICD-10-CM

## 2021-07-21 DIAGNOSIS — R531 Weakness: Secondary | ICD-10-CM

## 2021-07-21 DIAGNOSIS — R918 Other nonspecific abnormal finding of lung field: Secondary | ICD-10-CM | POA: Diagnosis not present

## 2021-07-21 DIAGNOSIS — Z28310 Unvaccinated for covid-19: Secondary | ICD-10-CM

## 2021-07-21 DIAGNOSIS — I110 Hypertensive heart disease with heart failure: Secondary | ICD-10-CM | POA: Diagnosis present

## 2021-07-21 DIAGNOSIS — J1282 Pneumonia due to coronavirus disease 2019: Secondary | ICD-10-CM | POA: Diagnosis not present

## 2021-07-21 DIAGNOSIS — I5082 Biventricular heart failure: Secondary | ICD-10-CM | POA: Diagnosis present

## 2021-07-21 DIAGNOSIS — R059 Cough, unspecified: Secondary | ICD-10-CM | POA: Diagnosis not present

## 2021-07-21 DIAGNOSIS — Z8249 Family history of ischemic heart disease and other diseases of the circulatory system: Secondary | ICD-10-CM | POA: Diagnosis not present

## 2021-07-21 DIAGNOSIS — N179 Acute kidney failure, unspecified: Secondary | ICD-10-CM

## 2021-07-21 DIAGNOSIS — I5022 Chronic systolic (congestive) heart failure: Secondary | ICD-10-CM | POA: Diagnosis present

## 2021-07-21 DIAGNOSIS — R051 Acute cough: Secondary | ICD-10-CM

## 2021-07-21 DIAGNOSIS — R7401 Elevation of levels of liver transaminase levels: Secondary | ICD-10-CM | POA: Diagnosis not present

## 2021-07-21 DIAGNOSIS — I2510 Atherosclerotic heart disease of native coronary artery without angina pectoris: Secondary | ICD-10-CM | POA: Diagnosis present

## 2021-07-21 DIAGNOSIS — I5042 Chronic combined systolic (congestive) and diastolic (congestive) heart failure: Secondary | ICD-10-CM | POA: Diagnosis present

## 2021-07-21 DIAGNOSIS — E8809 Other disorders of plasma-protein metabolism, not elsewhere classified: Secondary | ICD-10-CM | POA: Diagnosis present

## 2021-07-21 DIAGNOSIS — U071 COVID-19: Principal | ICD-10-CM | POA: Diagnosis present

## 2021-07-21 DIAGNOSIS — I10 Essential (primary) hypertension: Secondary | ICD-10-CM | POA: Diagnosis not present

## 2021-07-21 DIAGNOSIS — I4891 Unspecified atrial fibrillation: Secondary | ICD-10-CM

## 2021-07-21 DIAGNOSIS — Z7902 Long term (current) use of antithrombotics/antiplatelets: Secondary | ICD-10-CM

## 2021-07-21 DIAGNOSIS — Z7982 Long term (current) use of aspirin: Secondary | ICD-10-CM

## 2021-07-21 DIAGNOSIS — I480 Paroxysmal atrial fibrillation: Secondary | ICD-10-CM | POA: Diagnosis present

## 2021-07-21 DIAGNOSIS — Z79899 Other long term (current) drug therapy: Secondary | ICD-10-CM

## 2021-07-21 DIAGNOSIS — J90 Pleural effusion, not elsewhere classified: Secondary | ICD-10-CM | POA: Diagnosis not present

## 2021-07-21 HISTORY — DX: COVID-19: U07.1

## 2021-07-21 LAB — SARS CORONAVIRUS 2 BY RT PCR: SARS Coronavirus 2 by RT PCR: POSITIVE — AB

## 2021-07-21 LAB — COMPREHENSIVE METABOLIC PANEL
ALT: 47 U/L — ABNORMAL HIGH (ref 0–44)
AST: 60 U/L — ABNORMAL HIGH (ref 15–41)
Albumin: 3 g/dL — ABNORMAL LOW (ref 3.5–5.0)
Alkaline Phosphatase: 36 U/L — ABNORMAL LOW (ref 38–126)
Anion gap: 8 (ref 5–15)
BUN: 21 mg/dL (ref 8–23)
CO2: 27 mmol/L (ref 22–32)
Calcium: 8.4 mg/dL — ABNORMAL LOW (ref 8.9–10.3)
Chloride: 104 mmol/L (ref 98–111)
Creatinine, Ser: 1.3 mg/dL — ABNORMAL HIGH (ref 0.61–1.24)
GFR, Estimated: 56 mL/min — ABNORMAL LOW (ref >60)
Glucose, Bld: 133 mg/dL — ABNORMAL HIGH (ref 70–99)
Potassium: 4 mmol/L (ref 3.5–5.1)
Sodium: 139 mmol/L (ref 135–145)
Total Bilirubin: 0.9 mg/dL (ref 0.3–1.2)
Total Protein: 6.4 g/dL — ABNORMAL LOW (ref 6.5–8.1)

## 2021-07-21 LAB — CBC WITH DIFFERENTIAL/PLATELET
Abs Immature Granulocytes: 0.04 10*3/uL (ref 0.00–0.07)
Basophils Absolute: 0 10*3/uL (ref 0.0–0.1)
Basophils Relative: 0 %
Eosinophils Absolute: 0 10*3/uL (ref 0.0–0.5)
Eosinophils Relative: 0 %
HCT: 43.6 % (ref 39.0–52.0)
Hemoglobin: 14.7 g/dL (ref 13.0–17.0)
Immature Granulocytes: 1 %
Lymphocytes Relative: 16 %
Lymphs Abs: 0.9 10*3/uL (ref 0.7–4.0)
MCH: 28.8 pg (ref 26.0–34.0)
MCHC: 33.7 g/dL (ref 30.0–36.0)
MCV: 85.3 fL (ref 80.0–100.0)
Monocytes Absolute: 0.4 10*3/uL (ref 0.1–1.0)
Monocytes Relative: 7 %
Neutro Abs: 4.6 10*3/uL (ref 1.7–7.7)
Neutrophils Relative %: 76 %
Platelets: 213 10*3/uL (ref 150–400)
RBC: 5.11 MIL/uL (ref 4.22–5.81)
RDW: 12.6 % (ref 11.5–15.5)
WBC: 6 10*3/uL (ref 4.0–10.5)
nRBC: 0 % (ref 0.0–0.2)

## 2021-07-21 LAB — TROPONIN I (HIGH SENSITIVITY): Troponin I (High Sensitivity): 20 ng/L — ABNORMAL HIGH (ref <18)

## 2021-07-21 LAB — BRAIN NATRIURETIC PEPTIDE: B Natriuretic Peptide: 150.4 pg/mL — ABNORMAL HIGH (ref 0.0–100.0)

## 2021-07-21 LAB — LIPASE, BLOOD: Lipase: 29 U/L (ref 11–51)

## 2021-07-21 LAB — FIBRINOGEN: Fibrinogen: 599 mg/dL — ABNORMAL HIGH (ref 210–475)

## 2021-07-21 MED ORDER — SODIUM CHLORIDE 0.9 % IV BOLUS
1000.0000 mL | Freq: Once | INTRAVENOUS | Status: AC
Start: 2021-07-21 — End: 2021-07-21
  Administered 2021-07-21: 1000 mL via INTRAVENOUS

## 2021-07-21 MED ORDER — IOHEXOL 300 MG/ML  SOLN
100.0000 mL | Freq: Once | INTRAMUSCULAR | Status: AC | PRN
  Administered 2021-07-21: 100 mL via INTRAVENOUS

## 2021-07-21 NOTE — ED Notes (Addendum)
[facility] called for transport, report given to transport, ETA 35 minutes

## 2021-07-21 NOTE — ED Provider Notes (Addendum)
[facility] EMERGENCY DEPARTMENT Provider Note   CSN: [id number] Arrival date & time: 07/21/21  1817     History  Chief Complaint  Patient presents with   Cough    Jeffery Barr is a 81 y.o. male.  Pt with c/o cough, congestion, for past one week. Symptoms acute onset, moderate, persistent. Cough occasional productive of small amount of phlegm, no hemoptysis. Chest soreness w coughing, otherwise no exertional chest pain or discomfort. Mild sob. Decreased po intake during this time, intermittent nausea. No abd pain or distension. Having normal bms. No dysuria. No extremity pain or swelling. No specific known ill contacts.   The history is provided by the patient, the spouse and medical records.  Cough Associated symptoms: shortness of breath   Associated symptoms: no chest pain, no chills, no diaphoresis, no fever, no headaches, no rash and no sore throat       Home Medications Prior to Admission medications   Medication Sig Start Date End Date Taking? Authorizing Provider  aspirin 81 MG tablet Take 81 mg by mouth daily.    [provider]  carvedilol (COREG) 3.125 MG tablet Take 1 tablet (3.125 mg total) by mouth 2 (two) times daily. 06/01/21 06/01/22  Thurnell Lose, MD  clopidogrel (PLAVIX) 75 MG tablet Take 1 tablet (75 mg total) by mouth daily. 06/02/21   Thurnell Lose, MD  ezetimibe (ZETIA) 10 MG tablet Take 1 tablet (10 mg total) by mouth daily. 06/01/21 06/01/22  Thurnell Lose, MD  hydrochlorothiazide (HYDRODIURIL) 25 MG tablet TAKE 1 TABLET BY MOUTH EVERY DAY Patient not taking: Reported on 06/20/2021 12/20/19   Sueanne Margarita, MD  rosuvastatin (CRESTOR) 20 MG tablet Take 1 tablet (20 mg total) by mouth daily. 06/02/21   Thurnell Lose, MD      Allergies    Patient has no known allergies.    Review of Systems   Review of Systems  Constitutional:  Negative for chills, diaphoresis and fever.  HENT:  Positive for congestion. Negative for  sore throat.   Eyes:  Negative for redness.  Respiratory:  Positive for cough and shortness of breath.   Cardiovascular:  Negative for chest pain, palpitations and leg swelling.  Gastrointestinal:  Positive for nausea. Negative for abdominal pain, diarrhea and vomiting.  Genitourinary:  Negative for dysuria and flank pain.  Musculoskeletal:  Negative for back pain and neck pain.  Skin:  Negative for rash.  Neurological:  Negative for headaches.  Hematological:  Does not bruise/bleed easily.  Psychiatric/Behavioral:  Negative for confusion.    Physical Exam Updated Vital Signs BP 134/66 (BP Location: Right Arm)   Pulse 93   Temp 98.2 F (36.8 C) (Oral)   Resp (!) 23   Ht 1.753 m (5\' 9" )   Wt 95.3 kg   SpO2 94%   BMI 31.01 kg/m  Physical Exam Vitals and nursing note reviewed.  Constitutional:      Appearance: Normal appearance. He is well-developed.  HENT:     Head: Atraumatic.     Nose: Nose normal.     Mouth/Throat:     Mouth: Mucous membranes are moist.     Pharynx: Oropharynx is clear. No oropharyngeal exudate or posterior oropharyngeal erythema.  Eyes:     General: No scleral icterus.    Conjunctiva/sclera: Conjunctivae normal.     Pupils: Pupils are equal, round, and reactive to light.  Neck:     Trachea: No tracheal deviation.  Cardiovascular:  Rate and Rhythm: Normal rate and regular rhythm.     Pulses: Normal pulses.     Heart sounds: Normal heart sounds. No murmur heard.   No friction rub. No gallop.  Pulmonary:     Effort: Pulmonary effort is normal. No accessory muscle usage or respiratory distress.     Breath sounds: Rhonchi present.  Abdominal:     General: Bowel sounds are normal. There is no distension.     Palpations: Abdomen is soft. There is no mass.     Tenderness: There is no abdominal tenderness. There is no guarding or rebound.     Hernia: No hernia is present.  Genitourinary:    Comments: No cva tenderness. Musculoskeletal:         General: No swelling or tenderness.     Cervical back: Normal range of motion and neck supple. No rigidity.     Right lower leg: No edema.     Left lower leg: No edema.  Skin:    General: Skin is warm and dry.     Findings: No rash.  Neurological:     Mental Status: He is alert.     Comments: Alert, speech clear. Motor/sens grossly intact bil.   Psychiatric:        Mood and Affect: Mood normal.    ED Results / Procedures / Treatments   Labs (all labs ordered are listed, but only abnormal results are displayed) Results for orders placed or performed during the hospital encounter of 07/21/21  SARS Coronavirus 2 by RT PCR (hospital order, performed in [facility] hospital lab) *cepheid single result test* Anterior Nasal Swab   Specimen: Anterior Nasal Swab  Result Value Ref Range   SARS Coronavirus 2 by RT PCR POSITIVE (A) NEGATIVE  Comprehensive metabolic panel  Result Value Ref Range   Sodium 139 135 - 145 mmol/L   Potassium 4.0 3.5 - 5.1 mmol/L   Chloride 104 98 - 111 mmol/L   CO2 27 22 - 32 mmol/L   Glucose, Bld 133 (H) 70 - 99 mg/dL   BUN 21 8 - 23 mg/dL   Creatinine, Ser 1.30 (H) 0.61 - 1.24 mg/dL   Calcium 8.4 (L) 8.9 - 10.3 mg/dL   Total Protein 6.4 (L) 6.5 - 8.1 g/dL   Albumin 3.0 (L) 3.5 - 5.0 g/dL   AST 60 (H) 15 - 41 U/L   ALT 47 (H) 0 - 44 U/L   Alkaline Phosphatase 36 (L) 38 - 126 U/L   Total Bilirubin 0.9 0.3 - 1.2 mg/dL   GFR, Estimated 56 (L) >60 mL/min   Anion gap 8 5 - 15  CBC with Differential  Result Value Ref Range   WBC 6.0 4.0 - 10.5 K/uL   RBC 5.11 4.22 - 5.81 MIL/uL   Hemoglobin 14.7 13.0 - 17.0 g/dL   HCT 43.6 39.0 - 52.0 %   MCV 85.3 80.0 - 100.0 fL   MCH 28.8 26.0 - 34.0 pg   MCHC 33.7 30.0 - 36.0 g/dL   RDW 12.6 11.5 - 15.5 %   Platelets 213 150 - 400 K/uL   nRBC 0.0 0.0 - 0.2 %   Neutrophils Relative % 76 %   Neutro Abs 4.6 1.7 - 7.7 K/uL   Lymphocytes Relative 16 %   Lymphs Abs 0.9 0.7 - 4.0 K/uL   Monocytes Relative 7 %    Monocytes Absolute 0.4 0.1 - 1.0 K/uL   Eosinophils Relative 0 %   Eosinophils Absolute 0.0  0.0 - 0.5 K/uL   Basophils Relative 0 %   Basophils Absolute 0.0 0.0 - 0.1 K/uL   Immature Granulocytes 1 %   Abs Immature Granulocytes 0.04 0.00 - 0.07 K/uL  Lipase, blood  Result Value Ref Range   Lipase 29 11 - 51 U/L  Troponin I (High Sensitivity)  Result Value Ref Range   Troponin I (High Sensitivity) 20 (H) <18 ng/L   DG Chest 2 View  Result Date: 07/21/2021 CLINICAL DATA:  Cough and congestion for 1 week.  Dehydrated. EXAM: CHEST - 2 VIEW COMPARISON:  Report from 11/05/2000 FINDINGS: Median sternotomy for CABG. Midline trachea. Normal heart size. Moderate right hemidiaphragm elevation. Mild pulmonary interstitial thickening is likely related to the clinical history of remote smoking. Right infrahilar volume loss secondary to diaphragmatic elevation. Lateral view degraded by patient arm position. IMPRESSION: Moderate right hemidiaphragm elevation. Resultant right infrahilar volume loss. No acute findings. Electronically Signed   By: Abigail Miyamoto M.D.   On: 07/21/2021 19:13      EKG EKG Interpretation  Date/Time:  Saturday July 21 2021 18:47:02 EDT Ventricular Rate:  89 PR Interval:    QRS Duration: 80 QT Interval:  366 QTC Calculation: 445 R Axis:   79 Text Interpretation: Atrial fibrillation Non-specific ST-t changes Artifact Confirmed by Lajean Saver [phone]) on 07/21/2021 7:11:06 PM  Radiology DG Chest 2 View  Result Date: 07/21/2021 CLINICAL DATA:  Cough and congestion for 1 week.  Dehydrated. EXAM: CHEST - 2 VIEW COMPARISON:  Report from 11/05/2000 FINDINGS: Median sternotomy for CABG. Midline trachea. Normal heart size. Moderate right hemidiaphragm elevation. Mild pulmonary interstitial thickening is likely related to the clinical history of remote smoking. Right infrahilar volume loss secondary to diaphragmatic elevation. Lateral view degraded by patient arm position. IMPRESSION:  Moderate right hemidiaphragm elevation. Resultant right infrahilar volume loss. No acute findings. Electronically Signed   By: Abigail Miyamoto M.D.   On: 07/21/2021 19:13   CT CHEST ABDOMEN PELVIS W CONTRAST  Result Date: 07/21/2021 CLINICAL DATA:  Acute abdominal pain. None nausea. Cough. COVID positive EXAM: CT CHEST, ABDOMEN, AND PELVIS WITH CONTRAST TECHNIQUE: Multidetector CT imaging of the chest, abdomen and pelvis was performed following the standard protocol during bolus administration of intravenous contrast. RADIATION DOSE REDUCTION: This exam was performed according to the departmental dose-optimization program which includes automated exposure control, adjustment of the mA and/or kV according to patient size and/or use of iterative reconstruction technique. CONTRAST:  171mL OMNIPAQUE IOHEXOL 300 MG/ML  SOLN COMPARISON:  Radiograph earlier today FINDINGS: CT CHEST FINDINGS Cardiovascular: Atherosclerosis of the thoracic aorta. No aortic aneurysm. Dense calcification of native coronary arteries, post CABG. The heart is normal in size. There is no pericardial effusion. Mediastinum/Nodes: Small right infrahilar/paraesophageal lymph nodes, not enlarged by size criteria. No enlarged mediastinal or hilar lymph nodes. No esophageal wall thickening no thyroid nodule. Lungs/Pleura: Multifocal patchy ground-glass and ill-defined opacities throughout both lungs, pattern typical of COVID 19. Small bilateral pleural effusions. Elevation of right hemidiaphragm with associated volume loss in the right middle lobe. Trachea and central bronchi are patent. Musculoskeletal: Prior median sternotomy. Remote posterior ninth through eleventh and lateral a 8 through eleventh rib fractures that have healed. Diffuse thoracic spondylosis with flowing anterior osteophytes. No acute osseous abnormalities. CT ABDOMEN PELVIS FINDINGS Hepatobiliary: Tiny subcentimeter hypodensity in the posterior right hepatic lobe, series 2, image 38,  too small to characterize. No other liver lesion. Unremarkable gallbladder. No biliary dilatation. Pancreas: Unremarkable. No pancreatic ductal dilatation or surrounding inflammatory  changes. Spleen: Normal in size without focal abnormality. Splenule anteriorly. Adrenals/Urinary Tract: No adrenal nodule. No hydronephrosis. No renal calculi or focal renal abnormality. Urinary bladder is only minimally distended, mildly thick walled. Stomach/Bowel: Decompressed stomach. No bowel obstruction or inflammation. The appendix is normal. Vascular/Lymphatic: Aortic atherosclerosis. No aortic aneurysm. Patent portal, splenic, and mesenteric veins. No abdominopelvic adenopathy. Reproductive: Prostate is unremarkable. Other: Multiple pelvic phleboliths. Minimal fat in the inguinal canals. Tiny fat containing umbilical hernia. No pneumoperitoneum or ascites. 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 Multilevel degenerative change in the lumbar spine There are no acute or suspicious osseous abnormalities. IMPRESSION: 1. Multifocal patchy ground-glass and ill-defined opacities throughout both lungs, pattern typical of COVID 19 pneumonia. Small bilateral pleural effusions. 2. Mild urinary bladder wall thickening, may be due to nondistention or can be seen with urinary tract infection. Recommend correlation with urinalysis. 3. Bilateral L5 pars interarticularis defects with grade 1 anterolisthesis of L5 on S1. 4. Elevation of the right hemidiaphragm. This may be sequela of remote injury, with multiple healed right rib fractures. Aortic Atherosclerosis (ICD10-I70.0). Electronically Signed   By: Keith Rake M.D.   On: 07/21/2021 20:41    Procedures Procedures    Medications Ordered in ED Medications - No data to display  ED Course/ Medical Decision Making/ A&P                           Medical Decision Making Problems Addressed: Acute cough: acute illness or  injury with systemic symptoms AKI (acute kidney injury) ([place]): acute illness or injury COVID-19 virus infection: acute illness or injury with systemic symptoms that poses a threat to life or bodily functions Dehydration: acute illness or injury with systemic symptoms Generalized weakness: acute illness or injury New onset a-fib [facility]): acute illness or injury with systemic symptoms that poses a threat to life or bodily functions  Amount and/or Complexity of Data Reviewed Independent Historian: spouse    Details: hx External Data Reviewed: labs and notes. Labs: ordered. Decision-making details documented in ED Course. Radiology: ordered and independent interpretation performed. Decision-making details documented in ED Course. ECG/medicine tests: ordered and independent interpretation performed. Decision-making details documented in ED Course. Discussion of management or test interpretation with external provider(s): Hospitalists, discussed pt.   Risk Prescription drug management. Decision regarding hospitalization.   Iv ns. Continuous pulse ox and cardiac monitoring. Labs ordered/sent. Imaging ordered.   Reviewed nursing notes and prior charts for additional history. External reports reviewed. Additional history from:  Cardiac monitor: sinus rhythm, rate 92.  Labs reviewed/interpreted by me - wbc and hgb normal. Cr sl elevated. Ns bolus.   Xrays reviewed/interpreted by me - ?elev HD.   Iv ns bolus.   CT reviewed/interpreted by me - no acute abd process. Chest c/w covid.  Additional labs reviewed/interpreted by me - covid positive. Indicates not vaccinated for covid.   Po fluids/food.   Patient with afib on ecg. No hx same. Pt with no idea how long present as no awareness of palpitations or irregular heart beat. Hx htn, tia, cad - chadsvasc score = 6.   On recheck, room air pulse ox 88-89%. On 2 liter ns improved into mid 90s.   Given weakness, dehydration, sob/mildly low  pulse ox, covid, new afib - will plan admission.   Hospitalists consulted for admission. Discussed pt, covid, mild hypoxia, new afib, etc with Dr Myna Hidalgo - he will admit.  Final Clinical Impression(s) / ED Diagnoses Final diagnoses:  None    Rx / DC Orders ED Discharge Orders     None           Lajean Saver, MD 07/21/21 2224

## 2021-07-21 NOTE — ED Triage Notes (Signed)
Pt reports cough/congestion x 1 wk; decreased appetite and feels dehydrated

## 2021-07-21 NOTE — ED Notes (Signed)
Pt is AxO x 4, covid+, on 3L [place]

## 2021-07-22 ENCOUNTER — Encounter (HOSPITAL_COMMUNITY): Payer: Self-pay | Admitting: Family Medicine

## 2021-07-22 DIAGNOSIS — R7401 Elevation of levels of liver transaminase levels: Secondary | ICD-10-CM

## 2021-07-22 DIAGNOSIS — Z951 Presence of aortocoronary bypass graft: Secondary | ICD-10-CM | POA: Diagnosis not present

## 2021-07-22 DIAGNOSIS — E860 Dehydration: Secondary | ICD-10-CM | POA: Diagnosis present

## 2021-07-22 DIAGNOSIS — Z87891 Personal history of nicotine dependence: Secondary | ICD-10-CM | POA: Diagnosis not present

## 2021-07-22 DIAGNOSIS — I5042 Chronic combined systolic (congestive) and diastolic (congestive) heart failure: Secondary | ICD-10-CM | POA: Diagnosis present

## 2021-07-22 DIAGNOSIS — J9601 Acute respiratory failure with hypoxia: Secondary | ICD-10-CM | POA: Diagnosis present

## 2021-07-22 DIAGNOSIS — I5082 Biventricular heart failure: Secondary | ICD-10-CM | POA: Diagnosis present

## 2021-07-22 DIAGNOSIS — I5022 Chronic systolic (congestive) heart failure: Secondary | ICD-10-CM

## 2021-07-22 DIAGNOSIS — E8809 Other disorders of plasma-protein metabolism, not elsewhere classified: Secondary | ICD-10-CM | POA: Diagnosis present

## 2021-07-22 DIAGNOSIS — I4891 Unspecified atrial fibrillation: Secondary | ICD-10-CM | POA: Diagnosis not present

## 2021-07-22 DIAGNOSIS — U071 COVID-19: Secondary | ICD-10-CM | POA: Diagnosis present

## 2021-07-22 DIAGNOSIS — R531 Weakness: Secondary | ICD-10-CM

## 2021-07-22 DIAGNOSIS — I10 Essential (primary) hypertension: Secondary | ICD-10-CM | POA: Diagnosis not present

## 2021-07-22 DIAGNOSIS — I480 Paroxysmal atrial fibrillation: Secondary | ICD-10-CM | POA: Diagnosis present

## 2021-07-22 DIAGNOSIS — Z9852 Vasectomy status: Secondary | ICD-10-CM | POA: Diagnosis not present

## 2021-07-22 DIAGNOSIS — J1282 Pneumonia due to coronavirus disease 2019: Secondary | ICD-10-CM

## 2021-07-22 DIAGNOSIS — I110 Hypertensive heart disease with heart failure: Secondary | ICD-10-CM | POA: Diagnosis present

## 2021-07-22 DIAGNOSIS — Z79899 Other long term (current) drug therapy: Secondary | ICD-10-CM | POA: Diagnosis not present

## 2021-07-22 DIAGNOSIS — I2510 Atherosclerotic heart disease of native coronary artery without angina pectoris: Secondary | ICD-10-CM | POA: Diagnosis present

## 2021-07-22 DIAGNOSIS — E785 Hyperlipidemia, unspecified: Secondary | ICD-10-CM

## 2021-07-22 DIAGNOSIS — Z7902 Long term (current) use of antithrombotics/antiplatelets: Secondary | ICD-10-CM | POA: Diagnosis not present

## 2021-07-22 DIAGNOSIS — Z28310 Unvaccinated for covid-19: Secondary | ICD-10-CM | POA: Diagnosis not present

## 2021-07-22 DIAGNOSIS — Z7982 Long term (current) use of aspirin: Secondary | ICD-10-CM | POA: Diagnosis not present

## 2021-07-22 DIAGNOSIS — Z8249 Family history of ischemic heart disease and other diseases of the circulatory system: Secondary | ICD-10-CM | POA: Diagnosis not present

## 2021-07-22 HISTORY — DX: Chronic systolic (congestive) heart failure: I50.22

## 2021-07-22 HISTORY — DX: Acute respiratory failure with hypoxia: J96.01

## 2021-07-22 HISTORY — DX: Weakness: R53.1

## 2021-07-22 HISTORY — DX: Paroxysmal atrial fibrillation: I48.0

## 2021-07-22 HISTORY — DX: Elevation of levels of liver transaminase levels: R74.01

## 2021-07-22 LAB — BLOOD GAS, VENOUS
Acid-Base Excess: 3.2 mmol/L — ABNORMAL HIGH (ref 0.0–2.0)
Bicarbonate: 26.9 mmol/L (ref 20.0–28.0)
O2 Saturation: 86 %
Patient temperature: 37
pCO2, Ven: 37 mmHg — ABNORMAL LOW (ref 44–60)
pH, Ven: 7.47 — ABNORMAL HIGH (ref 7.25–7.43)
pO2, Ven: 51 mmHg — ABNORMAL HIGH (ref 32–45)

## 2021-07-22 LAB — CBC WITH DIFFERENTIAL/PLATELET
Abs Immature Granulocytes: 0.03 10*3/uL (ref 0.00–0.07)
Basophils Absolute: 0 10*3/uL (ref 0.0–0.1)
Basophils Relative: 0 %
Eosinophils Absolute: 0 10*3/uL (ref 0.0–0.5)
Eosinophils Relative: 0 %
HCT: 40.8 % (ref 39.0–52.0)
Hemoglobin: 13.3 g/dL (ref 13.0–17.0)
Immature Granulocytes: 1 %
Lymphocytes Relative: 23 %
Lymphs Abs: 1.2 10*3/uL (ref 0.7–4.0)
MCH: 29.1 pg (ref 26.0–34.0)
MCHC: 32.6 g/dL (ref 30.0–36.0)
MCV: 89.3 fL (ref 80.0–100.0)
Monocytes Absolute: 0.4 10*3/uL (ref 0.1–1.0)
Monocytes Relative: 9 %
Neutro Abs: 3.4 10*3/uL (ref 1.7–7.7)
Neutrophils Relative %: 67 %
Platelets: 159 10*3/uL (ref 150–400)
RBC: 4.57 MIL/uL (ref 4.22–5.81)
RDW: 12.7 % (ref 11.5–15.5)
WBC: 5.1 10*3/uL (ref 4.0–10.5)
nRBC: 0 % (ref 0.0–0.2)

## 2021-07-22 LAB — FERRITIN: Ferritin: 594 ng/mL — ABNORMAL HIGH (ref 24–336)

## 2021-07-22 LAB — URINALYSIS, ROUTINE W REFLEX MICROSCOPIC
Bacteria, UA: NONE SEEN
Bilirubin Urine: NEGATIVE
Glucose, UA: NEGATIVE mg/dL
Hgb urine dipstick: NEGATIVE
Ketones, ur: NEGATIVE mg/dL
Leukocytes,Ua: NEGATIVE
Nitrite: NEGATIVE
Protein, ur: 100 mg/dL — AB
Specific Gravity, Urine: 1.03 (ref 1.005–1.030)
pH: 5 (ref 5.0–8.0)

## 2021-07-22 LAB — RAPID URINE DRUG SCREEN, HOSP PERFORMED
Amphetamines: NOT DETECTED
Barbiturates: NOT DETECTED
Benzodiazepines: NOT DETECTED
Cocaine: NOT DETECTED
Opiates: NOT DETECTED
Tetrahydrocannabinol: NOT DETECTED

## 2021-07-22 LAB — COMPREHENSIVE METABOLIC PANEL
ALT: 39 U/L (ref 0–44)
AST: 42 U/L — ABNORMAL HIGH (ref 15–41)
Albumin: 2.7 g/dL — ABNORMAL LOW (ref 3.5–5.0)
Alkaline Phosphatase: 29 U/L — ABNORMAL LOW (ref 38–126)
Anion gap: 7 (ref 5–15)
BUN: 21 mg/dL (ref 8–23)
CO2: 27 mmol/L (ref 22–32)
Calcium: 8.2 mg/dL — ABNORMAL LOW (ref 8.9–10.3)
Chloride: 106 mmol/L (ref 98–111)
Creatinine, Ser: 1.15 mg/dL (ref 0.61–1.24)
GFR, Estimated: >60 mL/min (ref >60)
Glucose, Bld: 99 mg/dL (ref 70–99)
Potassium: 3.9 mmol/L (ref 3.5–5.1)
Sodium: 140 mmol/L (ref 135–145)
Total Bilirubin: 0.9 mg/dL (ref 0.3–1.2)
Total Protein: 5.6 g/dL — ABNORMAL LOW (ref 6.5–8.1)

## 2021-07-22 LAB — LACTATE DEHYDROGENASE: LDH: 300 U/L — ABNORMAL HIGH (ref 98–192)

## 2021-07-22 LAB — HEPATITIS PANEL, ACUTE
HCV Ab: NONREACTIVE
Hep A IgM: NONREACTIVE
Hep B C IgM: NONREACTIVE
Hepatitis B Surface Ag: NONREACTIVE

## 2021-07-22 LAB — PHOSPHORUS: Phosphorus: 4.1 mg/dL (ref 2.5–4.6)

## 2021-07-22 LAB — PROTIME-INR
INR: 1.2 (ref 0.8–1.2)
Prothrombin Time: 15 seconds (ref 11.4–15.2)

## 2021-07-22 LAB — C-REACTIVE PROTEIN: CRP: 3.1 mg/dL — ABNORMAL HIGH (ref <1.0)

## 2021-07-22 LAB — ACETAMINOPHEN LEVEL: Acetaminophen (Tylenol), Serum: <10 ug/mL — ABNORMAL LOW (ref 10–30)

## 2021-07-22 LAB — TRIGLYCERIDES: Triglycerides: 75 mg/dL (ref <150)

## 2021-07-22 LAB — MAGNESIUM: Magnesium: 2.3 mg/dL (ref 1.7–2.4)

## 2021-07-22 LAB — TSH: TSH: 2.417 u[IU]/mL (ref 0.350–4.500)

## 2021-07-22 LAB — PROCALCITONIN: Procalcitonin: <0.1 ng/mL

## 2021-07-22 LAB — TROPONIN I (HIGH SENSITIVITY): Troponin I (High Sensitivity): 16 ng/L (ref <18)

## 2021-07-22 MED ORDER — ACETAMINOPHEN 650 MG RE SUPP: 650.0000 mg | Freq: Four times a day (QID) | RECTAL | Status: DC | PRN

## 2021-07-22 MED ORDER — EZETIMIBE 10 MG PO TABS
10.0000 mg | ORAL_TABLET | Freq: Every day | ORAL | Status: DC
  Administered 2021-07-22 – 2021-07-24 (×3): 10 mg via ORAL
  Filled 2021-07-22 (×3): qty 1

## 2021-07-22 MED ORDER — PREDNISONE 50 MG PO TABS: 50.0000 mg | ORAL_TABLET | Freq: Every day | ORAL | Status: DC

## 2021-07-22 MED ORDER — APIXABAN 5 MG PO TABS
5.0000 mg | ORAL_TABLET | Freq: Two times a day (BID) | ORAL | Status: DC
  Administered 2021-07-22 – 2021-07-24 (×5): 5 mg via ORAL
  Filled 2021-07-22 (×5): qty 1

## 2021-07-22 MED ORDER — ACETAMINOPHEN 325 MG PO TABS: 650.0000 mg | ORAL_TABLET | Freq: Four times a day (QID) | ORAL | Status: DC | PRN

## 2021-07-22 MED ORDER — CARVEDILOL 3.125 MG PO TABS
3.1250 mg | ORAL_TABLET | Freq: Two times a day (BID) | ORAL | Status: DC
  Administered 2021-07-22 – 2021-07-23 (×4): 3.125 mg via ORAL
  Filled 2021-07-22 (×5): qty 1

## 2021-07-22 MED ORDER — ALBUTEROL SULFATE HFA 108 (90 BASE) MCG/ACT IN AERS: 1.0000 | INHALATION_SPRAY | RESPIRATORY_TRACT | Status: DC | PRN

## 2021-07-22 MED ORDER — METHYLPREDNISOLONE SODIUM SUCC 125 MG IJ SOLR
0.5000 mg/kg | Freq: Two times a day (BID) | INTRAMUSCULAR | Status: DC
  Administered 2021-07-22 – 2021-07-24 (×5): 47.5 mg via INTRAVENOUS
  Filled 2021-07-22 (×4): qty 2

## 2021-07-22 MED ORDER — BENZONATATE 100 MG PO CAPS
200.0000 mg | ORAL_CAPSULE | Freq: Three times a day (TID) | ORAL | Status: DC | PRN
  Filled 2021-07-22 (×2): qty 2

## 2021-07-22 NOTE — Assessment & Plan Note (Signed)
 #)   Essential Hypertension: documented h/o such, with outpatient antihypertensive regimen including Coreg.  SBP's in the ED today: Normotensive.   Plan: Close monitoring of subsequent BP via routine VS. continue home Coreg     

## 2021-07-22 NOTE — Progress Notes (Signed)
PROGRESS NOTE    Jeffery Barr  [id number] DOB: 05-Feb-1941 DOA: 07/21/2021 PCP: Orpah Melter, MD   Brief Narrative: 81 year old with past medical history significant for hypertension, hyperlipidemia, chronic right-sided diastolic  heart failure who presented 07/21/2021 with cough, poor oral intake, generalized weakness found to have COVID-pneumonia.  CT chest abdomen and pelvis with contrast showed multifocal patchy groundglass opacity throughout both lung consistent with COVID-pneumonia, a small bilateral pleural effusion, COVID-19 PCR positive.  Initial oxygen saturation 88 on room air, placed on 2 L of oxygen with improved oxygen saturation 97%.   Assessment & Plan:   Principal Problem:   Pneumonia due to COVID-19 virus Active Problems:   HTN (hypertension)   Dyslipidemia   Acute respiratory failure with hypoxia (HCC)   Transaminitis   Generalized weakness   Paroxysmal atrial fibrillation (HCC)   Chronic systolic CHF (congestive heart failure) (HCC)  1-Severe Covid 19 Pneumonia, Acute Hypoxic Respiratory  failure.  -Presents with cough, hypoxia oxygen sat 888 RA, CT chest: multifocal patchy groundglass opacity throughout both lung . -Symptoms started greater 1 week ago.  -IV solumedrol. Nebulizer.  COVID-19 Labs  Recent Labs    07/21/21 2218  FERRITIN 594*  LDH 300*  CRP 3.1*    Lab Results  Component Value Date   SARSCOV2NAA POSITIVE (A) 07/21/2021      2-A fib , new diagnosis.  Recent ECHO 05/2021: Ef 65 %  Started on Eliquis.   Transaminases:  Mild at AST 60, ALT 47.  Continue to hold statins.  Follow trend.   HTN;  Continue with Coreg.   HLD;  Hold statins due to transaminases.   Chronic right side Systolic Heart failure;  BNP 150.  Continue with Coreg.    Mild elevation troponin at 20.  Very mild, suspect demand ischemia in setting of covid.    Estimated body mass index is 31.09 kg/m as calculated from the following:   Height as of  this encounter: 5\' 9"  (1.753 m).   Weight as of this encounter: 95.5 kg.   DVT prophylaxis: on Eliquis.  Code Status: Full code Family Communication: Care discussed with patient Disposition Plan:  Status is: Inpatient Remains inpatient appropriate because: remain inpatient for management of acute hypoxic resp failure.     Consultants:  None  Procedures:  none  Antimicrobials:    Subjective: He is breathing better. He has been having cough for one week.    Objective: Vitals:   07/21/21 2300 07/22/21 0127 07/22/21 0501 07/22/21 0545  BP: 132/65 (!) 149/78  (!) 151/75  Pulse: 77 83  76  Resp: (!) 26 (!) 22    Temp:  99 F (37.2 C)  99 F (37.2 C)  TempSrc:  Oral    SpO2: 97% 97%  97%  Weight:   95.5 kg   Height:   5\' 9"  (1.753 m)     Intake/Output Summary (Last 24 hours) at 07/22/2021 0729 Last data filed at 07/21/2021 2217 Gross per 24 hour  Intake 1000 ml  Output --  Net 1000 ml   Filed Weights   07/21/21 1839 07/22/21 0501  Weight: 95.3 kg 95.5 kg    Examination:  General exam: Appears calm and comfortable  Respiratory system: Clear to auscultation. Respiratory effort normal. Cardiovascular system: S1 & S2 heard, RRR. No JVD, murmurs, rubs, gallops or clicks. No pedal edema. Gastrointestinal system: Abdomen is nondistended, soft and nontender. No organomegaly or masses felt. Normal bowel sounds heard. Central nervous system: Alert and oriented.  Extremities: Symmetric 5 x 5 power.   Data Reviewed: I have personally reviewed following labs and imaging studies  CBC: Recent Labs  Lab 07/21/21 1924 07/22/21 0606  WBC 6.0 5.1  NEUTROABS 4.6 3.4  HGB 14.7 13.3  HCT 43.6 40.8  MCV 85.3 89.3  PLT 213 159   Basic Metabolic Panel: Recent Labs  Lab 07/21/21 1924  NA 139  K 4.0  CL 104  CO2 27  GLUCOSE 133*  BUN 21  CREATININE 1.30*  CALCIUM 8.4*   GFR: Estimated Creatinine Clearance: 52.5 mL/min (A) (by C-G formula based on SCr of 1.3 mg/dL  (H)). Liver Function Tests: Recent Labs  Lab 07/21/21 1924  AST 60*  ALT 47*  ALKPHOS 36*  BILITOT 0.9  PROT 6.4*  ALBUMIN 3.0*   Recent Labs  Lab 07/21/21 1924  LIPASE 29   No results for input(s): AMMONIA in the last 168 hours. Coagulation Profile: Recent Labs  Lab 07/22/21 0616  INR 1.2   Cardiac Enzymes: No results for input(s): CKTOTAL, CKMB, CKMBINDEX, TROPONINI in the last 168 hours. BNP (last 3 results) No results for input(s): PROBNP in the last 8760 hours. HbA1C: No results for input(s): HGBA1C in the last 72 hours. CBG: No results for input(s): GLUCAP in the last 168 hours. Lipid Profile: Recent Labs    07/21/21 2218  TRIG 75   Thyroid Function Tests: No results for input(s): TSH, T4TOTAL, FREET4, T3FREE, THYROIDAB in the last 72 hours. Anemia Panel: Recent Labs    07/21/21 2218  FERRITIN 594*   Sepsis Labs: Recent Labs  Lab 07/21/21 2218  PROCALCITON <0.10    Recent Results (from the past 240 hour(s))  SARS Coronavirus 2 by RT PCR (hospital order, performed in [facility] hospital lab) *cepheid single result test* Anterior Nasal Swab     Status: Abnormal   Collection Time: 07/21/21  7:24 PM   Specimen: Anterior Nasal Swab  Result Value Ref Range Status   SARS Coronavirus 2 by RT PCR POSITIVE (A) NEGATIVE Final    Comment: (NOTE) SARS-CoV-2 target nucleic acids are DETECTED  SARS-CoV-2 RNA is generally detectable in upper respiratory specimens  during the acute phase of infection.  Positive results are indicative  of the presence of the identified virus, but do not rule out bacterial infection or co-infection with other pathogens not detected by the test.  Clinical correlation with patient history and  other diagnostic information is necessary to determine patient infection status.  The expected result is negative.  Fact Sheet for Patients:   RoadLapTop.co.za   Fact Sheet for Healthcare Providers:    http://kim-miller.com/    This test is not yet approved or cleared by the [place] FDA and  has been authorized for detection and/or diagnosis of SARS-CoV-2 by FDA under an Emergency Use Authorization (EUA).  This EUA will remain in effect (meaning this test can be used) for the duration of  the COVID-19 declaration under Section 564(b)(1)  of the Act, 21 U.S.C. section 360-bbb-3(b)(1), unless the authorization is terminated or revoked sooner.   Performed at [facility], [address] [place], [place] [address]          Radiology Studies: DG Chest 2 View  Result Date: 07/21/2021 CLINICAL DATA:  Cough and congestion for 1 week.  Dehydrated. EXAM: CHEST - 2 VIEW COMPARISON:  Report from 11/05/2000 FINDINGS: Median sternotomy for CABG. Midline trachea. Normal heart size. Moderate right hemidiaphragm elevation. Mild pulmonary interstitial thickening is likely  related to the clinical history of remote smoking. Right infrahilar volume loss secondary to diaphragmatic elevation. Lateral view degraded by patient arm position. IMPRESSION: Moderate right hemidiaphragm elevation. Resultant right infrahilar volume loss. No acute findings. Electronically Signed   By: Abigail Miyamoto M.D.   On: 07/21/2021 19:13   CT CHEST ABDOMEN PELVIS W CONTRAST  Result Date: 07/21/2021 CLINICAL DATA:  Acute abdominal pain. None nausea. Cough. COVID positive EXAM: CT CHEST, ABDOMEN, AND PELVIS WITH CONTRAST TECHNIQUE: Multidetector CT imaging of the chest, abdomen and pelvis was performed following the standard protocol during bolus administration of intravenous contrast. RADIATION DOSE REDUCTION: This exam was performed according to the departmental dose-optimization program which includes automated exposure control, adjustment of the mA and/or kV according to patient size and/or use of iterative reconstruction technique. CONTRAST:  110mL OMNIPAQUE IOHEXOL 300 MG/ML  SOLN COMPARISON:   Radiograph earlier today FINDINGS: CT CHEST FINDINGS Cardiovascular: Atherosclerosis of the thoracic aorta. No aortic aneurysm. Dense calcification of native coronary arteries, post CABG. The heart is normal in size. There is no pericardial effusion. Mediastinum/Nodes: Small right infrahilar/paraesophageal lymph nodes, not enlarged by size criteria. No enlarged mediastinal or hilar lymph nodes. No esophageal wall thickening no thyroid nodule. Lungs/Pleura: Multifocal patchy ground-glass and ill-defined opacities throughout both lungs, pattern typical of COVID 19. Small bilateral pleural effusions. Elevation of right hemidiaphragm with associated volume loss in the right middle lobe. Trachea and central bronchi are patent. Musculoskeletal: Prior median sternotomy. Remote posterior ninth through eleventh and lateral a 8 through eleventh rib fractures that have healed. Diffuse thoracic spondylosis with flowing anterior osteophytes. No acute osseous abnormalities. CT ABDOMEN PELVIS FINDINGS Hepatobiliary: Tiny subcentimeter hypodensity in the posterior right hepatic lobe, series 2, image 38, too small to characterize. No other liver lesion. Unremarkable gallbladder. No biliary dilatation. Pancreas: Unremarkable. No pancreatic ductal dilatation or surrounding inflammatory changes. Spleen: Normal in size without focal abnormality. Splenule anteriorly. Adrenals/Urinary Tract: No adrenal nodule. No hydronephrosis. No renal calculi or focal renal abnormality. Urinary bladder is only minimally distended, mildly thick walled. Stomach/Bowel: Decompressed stomach. No bowel obstruction or inflammation. The appendix is normal. Vascular/Lymphatic: Aortic atherosclerosis. No aortic aneurysm. Patent portal, splenic, and mesenteric veins. No abdominopelvic adenopathy. Reproductive: Prostate is unremarkable. Other: Multiple pelvic phleboliths. Minimal fat in the inguinal canals. Tiny fat containing umbilical hernia. No  pneumoperitoneum or ascites. 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 Multilevel degenerative change in the lumbar spine There are no acute or suspicious osseous abnormalities. IMPRESSION: 1. Multifocal patchy ground-glass and ill-defined opacities throughout both lungs, pattern typical of COVID 19 pneumonia. Small bilateral pleural effusions. 2. Mild urinary bladder wall thickening, may be due to nondistention or can be seen with urinary tract infection. Recommend correlation with urinalysis. 3. Bilateral L5 pars interarticularis defects with grade 1 anterolisthesis of L5 on S1. 4. Elevation of the right hemidiaphragm. This may be sequela of remote injury, with multiple healed right rib fractures. Aortic Atherosclerosis (ICD10-I70.0). Electronically Signed   By: Keith Rake M.D.   On: 07/21/2021 20:41        Scheduled Meds:  apixaban  5 mg Oral BID   carvedilol  3.125 mg Oral BID   ezetimibe  10 mg Oral Daily   methylPREDNISolone (SOLU-MEDROL) injection  0.5 mg/kg Intravenous Q12H   Followed by   Derrill Memo ON 07/25/2021] predniSONE  50 mg Oral Q breakfast   Continuous Infusions:   LOS: 0 days    Time spent: 35 minutes.  Elmarie Shiley, MD [facility]   If 7PM-7AM, please contact night-coverage [email or web address]  07/22/2021, 7:29 AM

## 2021-07-22 NOTE — Assessment & Plan Note (Signed)
 #)   Generalized weakness: 7 to 8 days of generalized weakness in the absence of any acute focal neurologic deficits to suggest acute CVA.  Rather, it appears that the patient's generalized weakness is on the basis of physiologic stress stemming from presenting severe [id number] pneumonia complicated by acute hypoxic respiratory distress, as above.  No evidence of additional underlying infectious process at this time, although will check urinalysis to further evaluate.  Plan: Check TSH, urinalysis.  Repeat CMP and CBC in the morning.  I have placed consults with physical therapy and Occupational Therapy for the morning.  Further evaluation management of severe COVID-19 pneumonia, as further detailed above.

## 2021-07-22 NOTE — Progress Notes (Signed)
OT Cancellation Note  Patient Details Name: Jeffery Barr MRN: [id number] DOB: 1940-05-01   Cancelled Treatment:    Reason Eval/Treat Not Completed: OT screened, no needs identified, will sign off. Patient independent with ADLs.  Minnah Llamas L Glenford Garis 07/22/2021, 3:32 PM

## 2021-07-22 NOTE — Assessment & Plan Note (Signed)
#)   Hyperlipidemia: documented h/o such. On high intensity rosuvastatin as outpatient.  In the context of mild acute transaminitis, will hold home statin for now.   Plan: Hold home statin for now, and repeat CMP in the morning.

## 2021-07-22 NOTE — Assessment & Plan Note (Signed)
 #)   Atrial fibrillation: new diagnosis; Presenting EKG demonstrates rate controlled atrial fibrillation, which appears to represent a new diagnosis for this patient, and represents interval change from normal sinus rhythm seen on most recent EKG from approximately 6 weeks ago.  Most recent echocardiogram was performed on 06/01/2021 and was notable for LVEF 60 to [id number], normal diastolic parameters, mildly decreased right ventricular systolic function and no evidence of significant valvular pathology.  Potential contribution from physiologic stress stemming from presenting severe COVID-19 pneumonia with corresponding acute hypoxic respiratory distress.  Patient has remained rate controlled throughout presentation thus far. In the setting of a CHA2DS2-VASc score of greater than 2, there is an indication for the patient to be on chronic anticoagulation for thromboembolic prophylaxis. Consult placed with inpatient pharmacist for initiation of Eliquis for this purpose.  Given that most recent echo occurred just a few weeks ago, will refrain from repeating the study at this time.  Of note, the patient is on Coreg as an outpatient.  Plan: Monitor on telemetry.  Check serum magnesium level.  Consulted inpatient pharmacist for assistance with initiation of Eliquis for thromboembolic prophylaxis, as above.  Check urinary drug screen.  Repeat troponin.  Continue home beta-blocker.

## 2021-07-22 NOTE — Assessment & Plan Note (Signed)
 #)   Severe COVID-19 pneumonia: dx on the basis of 8 days of new onset nonproductive cough associated with generalized weakness, with positive COVID-19 PCR in ED today, along with CT chest showing evidence of multifocal patchy groundglass opacities throughout both lungs consistent with COVID-19 pneumonia.  Additionally, in context of no known baseline supplemental O2 requirements, the pt  is requiring 2 L nasal cannula in order to maintain O2 sats greater than or equal to 94%. In setting of this acute hypoxia, criteria are met for patient's COVID-19 infxn to be considered severe in nature. Consequently,will initial solumedrol.   In this patient with acute respiratory symptoms who is being admitted specifically for further evaluation and management of presenting COVID-19 pneumonia, criteria are not met for initiation of remdesivir given that the patient's symptoms started greater than 1 week ago.   Will closely monitor ensuing degree of hypoxia as well as associated trend in supplemental O2 requirements. Will also trend inflammatory markers, as further described below.  No known chronic underlying pulmonary pathology or h/o diabetes.   Of note, procalcitonin was found to be non-elevated, which, in the context of the pro-inflammatory state a/w COVID-19, provides a high degree of negative predictive value against the likelihood of any contribution from bacterial pneumonia.   Plan: Airborne and contact precautions. Monitor continuous pulse ox and monitor on telemetry. prn supplemental O2 to maintain O2 sats greater than or equal to 94%. Proning protocol initiated. PRN albuterol inhaler.  PRN acetaminophen for fever. Start solumedrol, as above. Check and trend inflammatory markers, including crp. Check serum mag and phos levels. Check CMP/CBC in the AM. Flutter valve and incentive spirometry. Check blood gas.  As needed [employer]

## 2021-07-22 NOTE — H&P (Signed)
History and Physical    PLEASE NOTE THAT DRAGON DICTATION SOFTWARE WAS USED IN THE CONSTRUCTION OF THIS NOTE.   Jeffery Barr HRC:[id number] DOB: 10-Apr-1940 DOA: 07/21/2021  PCP: Orpah Melter, MD  Patient coming from: home   I have personally briefly reviewed patient's old medical records in [facility]  Chief Complaint: Cough  HPI: Jeffery Barr is a 81 y.o. male with medical history significant for essential hypertension, hyperlipidemia, chronic right-sided systolic heart failure, who is admitted to [facility] on 07/21/2021 by way of transfer from [place] emergency department with severe COVID-19 pneumonia after presenting from home to the latter facility complaining of cough.   The patient reports 7 to 8 days of new onset nonproductive cough in the absence of any hemoptysis.  Denies any associated shortness of breath, subjective fever, chills, rigors, or generalized myalgias.  Associate with any chest pain, palpitations, diaphoresis, dizziness, presyncope, or syncope.  Denies any new onset peripheral edema, nor any calf tenderness or lower extremity erythema.  Not associate with any sore throat, wheezing, rash.  No recent dysuria, gross hematuria, or change in urinary urgency/frequency.  While he denies any recent nausea, vomiting, diarrhea or abdominal pain, he notes that this new onset cough is also been associated with diminished appetite resulting in a slight recent decline in oral intake of both food and water over that timeframe.  He also notes generalized weakness In the absence of any associated acute focal weakness, acute focal numbness, paresthesias, facial droop, slurred speech, expressive aphasia, acute change in vision, dysphagia, vertigo.  The patient reports that the above symptoms started shortly after returning from [place], while noting no known sick contacts during that trip.   Denies any known underlying chronic pulmonary pathology,  and denies any known baseline supplemental oxygen requirements.  Medical history notable for chronic right-sided systolic heart failure, with most recent echocardiogram on 06/01/2021 notable for LVEF 60 to 65%, no focal wall motion abnormalities, moderate LVH, normal diastolic parameters, mildly decreased right ventricular systolic function, and no evidence of significant valvular pathology.  Patient confirms no known history of atrial fibrillation and is not currently on formal anticoagulation as an outpatient.  Confirms no known history of underlying diabetes.     [facility] ED Course:  Vital signs in the ED were notable for the following: Temperature 9.0; heart rate 80-93; blood pressure 134/65; respiratory rate 18-26, initial oxygen saturation 88% on room air, subsequent improving into the range of 97 to 98% on 2 L nasal cannula.  Labs were notable for the following: CMP notable for the following: Bicarbonate 27, creatinine 1.30 compared to most recent prior serum creatinine data point of 1.12 on 06/01/2021, calcium, corrected for mild hypoalbuminemia noted to be 9.2, albumin 3.0, alkaline phosphatase 36, AST 60 compared to 13 on 06/01/2021, ALT 47 compared to 15 on 06/01/2021, total bilirubin 0.9.  Lipase 29.  BNP 150, down from most recent prior value of 194 on 06/01/2021.  High-sensitivity troponin I 20.  Procalcitonin less than 0.10.  CBC notable for white blood cell count 6000 urinalysis ordered, with result currently pending.  COVID-19 PCR positive.   Imaging and additional notable ED work-up: Chest x-ray showed no evidence of acute cardiopulmonary process.  CT chest, abdomen, pelvis with contrast showed multifocal patchy groundglass opacities throughout both lungs consistent with COVID-19 pneumonia, also showing small bilateral pleural effusions in the absence of evidence of pulmonary edema or pneumothorax.  Additionally, this imaging  showed no evidence of acute intra-abdominal or acute  intrapelvic process, including no evidence of gallbladder wall thickening, pericholecystic fluid, cholelithiasis, choledocholithiasis, duct dilation.  This imaging also showed no evidence of acute pancreatic pathology.  While in the ED, the following were administered: Normal saline x1 L bolus.  Subsequently, the patient was transferred to med telemetry at [facility] for further evaluation management of presenting severe [id number]-84 pneumonia complicated by acute hypoxic respiratory distress, new diagnosis of atrial fibrillation, which has been rate controlled, with presenting labs also notable for mild acute transaminitis presenting generalized weakness.     Review of Systems: As per HPI otherwise 10 point review of systems negative.   Past Medical History:  Diagnosis Date   Benign hypertensive heart disease without heart failure    Coronary atherosclerosis of native coronary artery    S/P CABG   Dyslipidemia    HTN (hypertension)    Obesity    Sinus bradycardia    a. baseline HR 50s.    Past Surgical History:  Procedure Laterality Date   CORONARY ARTERY BYPASS GRAFT     VASECTOMY      Social History:  reports that he has quit smoking. He has never used smokeless tobacco. He reports current alcohol use of about 3.0 - 5.0 standard drinks per week. He reports that he does not use drugs.   No Known Allergies  Family History  Problem Relation Age of Onset   Heart failure Mother    CAD Mother    Heart disease Brother    CAD Sister    CAD Other    Heart attack Other     Family history reviewed and not pertinent    Prior to Admission medications   Medication Sig Start Date End Date Taking? Authorizing Provider  aspirin 81 MG tablet Take 81 mg by mouth daily.    [provider]  carvedilol (COREG) 3.125 MG tablet Take 1 tablet (3.125 mg total) by mouth 2 (two) times daily. 06/01/21 06/01/22  Thurnell Lose, MD  clopidogrel (PLAVIX) 75 MG tablet Take 1 tablet (75 mg  total) by mouth daily. 06/02/21   Thurnell Lose, MD  ezetimibe (ZETIA) 10 MG tablet Take 1 tablet (10 mg total) by mouth daily. 06/01/21 06/01/22  Thurnell Lose, MD  hydrochlorothiazide (HYDRODIURIL) 25 MG tablet TAKE 1 TABLET BY MOUTH EVERY DAY Patient not taking: Reported on 06/20/2021 12/20/19   Sueanne Margarita, MD  rosuvastatin (CRESTOR) 20 MG tablet Take 1 tablet (20 mg total) by mouth daily. 06/02/21   Thurnell Lose, MD     Objective    Physical Exam: Vitals:   07/21/21 2230 07/21/21 2300 07/22/21 0127 07/22/21 0501  BP: 137/78 132/65 (!) 149/78   Pulse: 78 77 83   Resp: 18 (!) 26 (!) 22   Temp:   99 F (37.2 C)   TempSrc:   Oral   SpO2: 100% 97% 97%   Weight:    95.5 kg  Height:    5' 9"  (1.753 m)    General: appears to be stated age; alert, oriented; mildly increased work of breathing noted Skin: warm, dry, no rash Head:  AT/[place] Mouth:  Oral mucosa membranes appear moist, normal dentition Neck: supple; trachea midline Heart:  RRR; did not appreciate any M/R/G Lungs: CTAB, did not appreciate any wheezes, rales, or rhonchi Abdomen: + BS; soft, ND, NT Vascular: 2+ pedal pulses b/l; 2+ radial pulses b/l Extremities: no peripheral edema, no muscle wasting Neuro:  strength and sensation intact in upper and lower extremities b/l    Labs on Admission: I have personally reviewed following labs and imaging studies  CBC: Recent Labs  Lab 07/21/21 1924  WBC 6.0  NEUTROABS 4.6  HGB 14.7  HCT 43.6  MCV 85.3  PLT 314   Basic Metabolic Panel: Recent Labs  Lab 07/21/21 1924  NA 139  K 4.0  CL 104  CO2 27  GLUCOSE 133*  BUN 21  CREATININE 1.30*  CALCIUM 8.4*   GFR: Estimated Creatinine Clearance: 52.5 mL/min (A) (by C-G formula based on SCr of 1.3 mg/dL (H)). Liver Function Tests: Recent Labs  Lab 07/21/21 1924  AST 60*  ALT 47*  ALKPHOS 36*  BILITOT 0.9  PROT 6.4*  ALBUMIN 3.0*   Recent Labs  Lab 07/21/21 1924  LIPASE 29   No results for  input(s): AMMONIA in the last 168 hours. Coagulation Profile: No results for input(s): INR, PROTIME in the last 168 hours. Cardiac Enzymes: No results for input(s): CKTOTAL, CKMB, CKMBINDEX, TROPONINI in the last 168 hours. BNP (last 3 results) No results for input(s): PROBNP in the last 8760 hours. HbA1C: No results for input(s): HGBA1C in the last 72 hours. CBG: No results for input(s): GLUCAP in the last 168 hours. Lipid Profile: Recent Labs    07/21/21 2218  TRIG 75   Thyroid Function Tests: No results for input(s): TSH, T4TOTAL, FREET4, T3FREE, THYROIDAB in the last 72 hours. Anemia Panel: Recent Labs    07/21/21 2218  FERRITIN 594*   Urine analysis: No results found for: COLORURINE, APPEARANCEUR, LABSPEC, [place], GLUCOSEU, HGBUR, BILIRUBINUR, KETONESUR, PROTEINUR, UROBILINOGEN, NITRITE, LEUKOCYTESUR  Radiological Exams on Admission: DG Chest 2 View  Result Date: 07/21/2021 CLINICAL DATA:  Cough and congestion for 1 week.  Dehydrated. EXAM: CHEST - 2 VIEW COMPARISON:  Report from 11/05/2000 FINDINGS: Median sternotomy for CABG. Midline trachea. Normal heart size. Moderate right hemidiaphragm elevation. Mild pulmonary interstitial thickening is likely related to the clinical history of remote smoking. Right infrahilar volume loss secondary to diaphragmatic elevation. Lateral view degraded by patient arm position. IMPRESSION: Moderate right hemidiaphragm elevation. Resultant right infrahilar volume loss. No acute findings. Electronically Signed   By: Abigail Miyamoto M.D.   On: 07/21/2021 19:13   CT CHEST ABDOMEN PELVIS W CONTRAST  Result Date: 07/21/2021 CLINICAL DATA:  Acute abdominal pain. None nausea. Cough. COVID positive EXAM: CT CHEST, ABDOMEN, AND PELVIS WITH CONTRAST TECHNIQUE: Multidetector CT imaging of the chest, abdomen and pelvis was performed following the standard protocol during bolus administration of intravenous contrast. RADIATION DOSE REDUCTION: This exam was  performed according to the departmental dose-optimization program which includes automated exposure control, adjustment of the mA and/or kV according to patient size and/or use of iterative reconstruction technique. CONTRAST:  170m OMNIPAQUE IOHEXOL 300 MG/ML  SOLN COMPARISON:  Radiograph earlier today FINDINGS: CT CHEST FINDINGS Cardiovascular: Atherosclerosis of the thoracic aorta. No aortic aneurysm. Dense calcification of native coronary arteries, post CABG. The heart is normal in size. There is no pericardial effusion. Mediastinum/Nodes: Small right infrahilar/paraesophageal lymph nodes, not enlarged by size criteria. No enlarged mediastinal or hilar lymph nodes. No esophageal wall thickening no thyroid nodule. Lungs/Pleura: Multifocal patchy ground-glass and ill-defined opacities throughout both lungs, pattern typical of COVID 19. Small bilateral pleural effusions. Elevation of right hemidiaphragm with associated volume loss in the right middle lobe. Trachea and central bronchi are patent. Musculoskeletal: Prior median sternotomy. Remote posterior ninth through eleventh and lateral a 8 through eleventh rib  fractures that have healed. Diffuse thoracic spondylosis with flowing anterior osteophytes. No acute osseous abnormalities. CT ABDOMEN PELVIS FINDINGS Hepatobiliary: Tiny subcentimeter hypodensity in the posterior right hepatic lobe, series 2, image 38, too small to characterize. No other liver lesion. Unremarkable gallbladder. No biliary dilatation. Pancreas: Unremarkable. No pancreatic ductal dilatation or surrounding inflammatory changes. Spleen: Normal in size without focal abnormality. Splenule anteriorly. Adrenals/Urinary Tract: No adrenal nodule. No hydronephrosis. No renal calculi or focal renal abnormality. Urinary bladder is only minimally distended, mildly thick walled. Stomach/Bowel: Decompressed stomach. No bowel obstruction or inflammation. The appendix is normal. Vascular/Lymphatic: Aortic  atherosclerosis. No aortic aneurysm. Patent portal, splenic, and mesenteric veins. No abdominopelvic adenopathy. Reproductive: Prostate is unremarkable. Other: Multiple pelvic phleboliths. Minimal fat in the inguinal canals. Tiny fat containing umbilical hernia. No pneumoperitoneum or ascites. 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 Multilevel degenerative change in the lumbar spine There are no acute or suspicious osseous abnormalities. IMPRESSION: 1. Multifocal patchy ground-glass and ill-defined opacities throughout both lungs, pattern typical of COVID 19 pneumonia. Small bilateral pleural effusions. 2. Mild urinary bladder wall thickening, may be due to nondistention or can be seen with urinary tract infection. Recommend correlation with urinalysis. 3. Bilateral L5 pars interarticularis defects with grade 1 anterolisthesis of L5 on S1. 4. Elevation of the right hemidiaphragm. This may be sequela of remote injury, with multiple healed right rib fractures. Aortic Atherosclerosis (ICD10-I70.0). Electronically Signed   By: Keith Rake M.D.   On: 07/21/2021 20:41     EKG: Independently reviewed, with result as described above.    Assessment/Plan    Principal Problem:   Pneumonia due to COVID-19 virus Active Problems:   HTN (hypertension)   Dyslipidemia   Acute respiratory failure with hypoxia (HCC)   Transaminitis   Generalized weakness   Paroxysmal atrial fibrillation (HCC)   Chronic systolic CHF (congestive heart failure) (HCC)       #) Severe COVID-19 pneumonia: dx on the basis of 8 days of new onset nonproductive cough associated with generalized weakness, with positive COVID-19 PCR in ED today, along with CT chest showing evidence of multifocal patchy groundglass opacities throughout both lungs consistent with COVID-19 pneumonia.  Additionally, in context of no known baseline supplemental O2 requirements, the pt  is  requiring 2 L nasal cannula in order to maintain O2 sats greater than or equal to 94%. In setting of this acute hypoxia, criteria are met for patient's COVID-19 infxn to be considered severe in nature. Consequently,will initial solumedrol.   In this patient with acute respiratory symptoms who is being admitted specifically for further evaluation and management of presenting COVID-19 pneumonia, criteria are not met for initiation of remdesivir given that the patient's symptoms started greater than 1 week ago.   Will closely monitor ensuing degree of hypoxia as well as associated trend in supplemental O2 requirements. Will also trend inflammatory markers, as further described below.  No known chronic underlying pulmonary pathology or h/o diabetes.   Of note, procalcitonin was found to be non-elevated, which, in the context of the pro-inflammatory state a/w COVID-19, provides a high degree of negative predictive value against the likelihood of any contribution from bacterial pneumonia.   Plan: Airborne and contact precautions. Monitor continuous pulse ox and monitor on telemetry. prn supplemental O2 to maintain O2 sats greater than or equal to 94%. Proning protocol initiated. PRN albuterol inhaler.  PRN acetaminophen for fever. Start solumedrol, as above. Check and trend inflammatory markers, including  crp. Check serum mag and phos levels. Check CMP/CBC in the AM. Flutter valve and incentive spirometry. Check blood gas.  As needed [employer]         #) Acute hypoxic respiratory distress: In the context of no known baseline supplemental oxygen requirements, initial oxygen saturations noted to be in the high 80s on room air, subsequent improving into the mid to high 90s on 2 L nasal cannula.  In the context of corresponding new onset cough, criteria met for acute hypoxic respiratory distress, which appears to be on the basis of severe COVID-19 pna, as above.  Corresponding nonelevated  procalcitonin confers a high degree of negative predictive value against the likelihood of any contribution from bacterial pneumonia.  No clinical or radiographic evidence to suggest acutely decompensated heart failure.  No known chronic underlying pulmonary pathology.  No evidence of pneumothorax on imaging.  While the patient is at increased risk for acute pulmonary embolism in the context of recent traveling and now with diagnosis of COVID-19, presentation appears less suggestive of acute pulmonary embolism at this time, with alternate diagnosis for his acute hypoxic respiratory distress on the basis of confirmed COVID-19 pneumonia.  ACS felt to be less likely in the absence of any recent chest pain.  Very minimal elevation of high-sensitivity troponin high is noted, and felt to be most consistent with type II supply/demand mismatch as a consequence of diminished oxygen delivery capacity due to corresponding acute hypoxic respiratory distress as opposed to representing a type I process due to acute plaque rupture.  Likely also an additional contribution stemming from new diagnosis of atrial fibrillation, as further detailed below.  We will continue to trend troponin, as further indicated below.    Plan: Further evaluation management of severe COVID-19 pneumonia, as above, including solumedrol.  Monitor continuous pulse oximetry.  Monitor strict I's and O's and daily weights.  Trend troponin.  Check serum magnesium and phosphorus levels.  Prn albuterol inhaler.  Check blood gas.  Monitor on telemetry.  Repeat CBC in the morning.       #) Atrial fibrillation: new diagnosis; Presenting EKG demonstrates rate controlled atrial fibrillation, which appears to represent a new diagnosis for this patient, and represents interval change from normal sinus rhythm seen on most recent EKG from approximately 6 weeks ago.  Most recent echocardiogram was performed on 06/01/2021 and was notable for LVEF 60 to 54%, normal  diastolic parameters, mildly decreased right ventricular systolic function and no evidence of significant valvular pathology.  Potential contribution from physiologic stress stemming from presenting severe COVID-19 pneumonia with corresponding acute hypoxic respiratory distress.  Patient has remained rate controlled throughout presentation thus far. In the setting of a CHA2DS2-VASc score of greater than 2, there is an indication for the patient to be on chronic anticoagulation for thromboembolic prophylaxis. Consult placed with inpatient pharmacist for initiation of Eliquis for this purpose.  Given that most recent echo occurred just a few weeks ago, will refrain from repeating the study at this time.  Of note, the patient is on Coreg as an outpatient.  Plan: Monitor on telemetry.  Check serum magnesium level.  Consulted inpatient pharmacist for assistance with initiation of Eliquis for thromboembolic prophylaxis, as above.  Check urinary drug screen.  Repeat troponin.  Continue home beta-blocker.        #) Acute transaminitis: Very mild elevation of AST and ALT in the absence of any elevation of alkaline phosphatase or total bilirubin to suggest a cholestatic pattern.  Additionally, CT abdomen/pelvis showed no evidence of acute process, including no evidence of acute cholecystitis nor any evidence of intrinsic versus extrinsic biliary obstruction will also demonstrating a normal-appearing pancreas.  Suspect contribution from confirmed diagnosis of severe COVID-19.  Given the patient's recent travel history, will also check acute viral hepatitis panel.   Plan: Repeat CMP in the morning.  Check acute viral hepatitis panel, urinary drug screen, acetaminophen level, INR.  Hold home statin for now.        #) Generalized weakness: 7 to 8 days of generalized weakness in the absence of any acute focal neurologic deficits to suggest acute CVA.  Rather, it appears that the patient's generalized weakness  is on the basis of physiologic stress stemming from presenting severe LEZVG-71 pneumonia complicated by acute hypoxic respiratory distress, as above.  No evidence of additional underlying infectious process at this time, although will check urinalysis to further evaluate.  Plan: Check TSH, urinalysis.  Repeat CMP and CBC in the morning.  I have placed consults with physical therapy and Occupational Therapy for the morning.  Further evaluation management of severe COVID-19 pneumonia, as further detailed above.          #) Essential Hypertension: documented h/o such, with outpatient antihypertensive regimen including Coreg.  SBP's in the ED today: Normotensive.   Plan: Close monitoring of subsequent BP via routine VS. continue home Coreg         #) Hyperlipidemia: documented h/o such. On high intensity rosuvastatin as outpatient.  In the context of mild acute transaminitis, will hold home statin for now.   Plan: Hold home statin for now, and repeat CMP in the morning.        #) Chronic right-sided systolic heart failure: Documented history of such, with most recent echocardiogram performed on 06/01/2021 notable for LVEF 60 to 59%, normal diastolic parameters, while showing evidence of mildly reduced right ventricular systolic function in the absence of any significant valvular pathology.  Overall, no evidence of left-sided heart failure.  Given increased risk for preload dependent pathophysiology associated with right-sided heart failure, it is notable that the patient received 1 L IV fluid bolus prior to transfer from [place].  No indication for initiation of IV diuresis at this time.  Plan: Monitor strict I's and O's and daily weights.  Add on serum magnesium level.  Repeat CMP in the morning.        DVT prophylaxis: SCD's   Code Status: Full code Family Communication: none Disposition Plan: Per Rounding Team Consults called: none;  Admission status:  Inpatient    PLEASE NOTE THAT DRAGON DICTATION SOFTWARE WAS USED IN THE CONSTRUCTION OF THIS NOTE.   [place] DO [facility] [place]   07/22/2021, 5:27 AM

## 2021-07-22 NOTE — TOC CM/SW Note (Signed)
  Transition of Care [facility]) Screening Note   Patient Details  Name: Jeffery Barr Date of Birth: 1940-04-14   Transition of Care [facility]) CM/SW Contact:    Darleene Cleaver, LCSW Phone Number: 07/22/2021, 6:20 PM    Transition of Care Department [facility]) has reviewed patient and no TOC needs have been identified at this time. We will continue to monitor patient advancement through interdisciplinary progression rounds. If new patient transition needs arise, please place a TOC consult.

## 2021-07-22 NOTE — Assessment & Plan Note (Signed)
  #)   Chronic right-sided systolic heart failure: Documented history of such, with most recent echocardiogram performed on 06/01/2021 notable for LVEF 60 to [id number], normal diastolic parameters, while showing evidence of mildly reduced right ventricular systolic function in the absence of any significant valvular pathology.  Overall, no evidence of left-sided heart failure.  Given increased risk for preload dependent pathophysiology associated with right-sided heart failure, it is notable that the patient received 1 L IV fluid bolus prior to transfer from [place].  No indication for initiation of IV diuresis at this time.  Plan: Monitor strict I's and O's and daily weights.  Add on serum magnesium level.  Repeat CMP in the morning.

## 2021-07-22 NOTE — Assessment & Plan Note (Signed)
  #)   Acute transaminitis: Very mild elevation of AST and ALT in the absence of any elevation of alkaline phosphatase or total bilirubin to suggest a cholestatic pattern.  Additionally, CT abdomen/pelvis showed no evidence of acute process, including no evidence of acute cholecystitis nor any evidence of intrinsic versus extrinsic biliary obstruction will also demonstrating a normal-appearing pancreas.  Suspect contribution from confirmed diagnosis of severe COVID-19.  Given the patient's recent travel history, will also check acute viral hepatitis panel.   Plan: Repeat CMP in the morning.  Check acute viral hepatitis panel, urinary drug screen, acetaminophen level, INR.  Hold home statin for now.

## 2021-07-22 NOTE — Evaluation (Signed)
Physical Therapy Evaluation Patient Details Name: Jeffery Barr MRN: [id number] DOB: 08/16/1940 Today's Date: 07/22/2021  History of Present Illness  Pt admitted from home 2* cough and SOB and dx with COVID PNA.  Pt with hx of chronic R side heart failure and CABG  Clinical Impression  Pt admitted as above and presenting with functional mobility limitations 2* decreased endurance and mild ambulatory balance deficits.  This date, pt requiring steady assist only to ambulate 200' in hall and to bathroom for toileting and hand hygiene standing at sink.  Pt should progress to dc home with family assist.     Recommendations for follow up therapy are one component of a multi-disciplinary discharge planning process, led by the attending physician.  Recommendations may be updated based on patient status, additional functional criteria and insurance authorization.  Follow Up Recommendations No PT follow up    Assistance Recommended at Discharge Intermittent Supervision/Assistance  Patient can return home with the following  A little help with walking and/or transfers;A little help with bathing/dressing/bathroom;Assistance with cooking/housework;Assist for transportation;Help with stairs or ramp for entrance    Equipment Recommendations Other (comment) (Likely no equipment needs but TBD)  Recommendations for Other Services       Functional Status Assessment Patient has had a recent decline in their functional status and demonstrates the ability to make significant improvements in function in a reasonable and predictable amount of time.     Precautions / Restrictions Precautions Precautions: Fall Restrictions Weight Bearing Restrictions: No      Mobility  Bed Mobility Overal bed mobility: Modified Independent             General bed mobility comments: no physical assist    Transfers Overall transfer level: Needs assistance Equipment used: 1 person hand held assist Transfers: Sit  to/from Stand Sit to Stand: Supervision           General transfer comment: for safety    Ambulation/Gait Ambulation/Gait assistance: Min guard Gait Distance (Feet): 200 Feet (and 15' into bathroom) Assistive device: 1 person hand held assist Gait Pattern/deviations: Step-through pattern, Staggering left, Staggering right, Wide base of support       General Gait Details: Steady assist only with pt demonstrating intermittent instability but no overt LOB  Stairs            Wheelchair Mobility    Modified Rankin (Stroke Patients Only)       Balance Overall balance assessment: Needs assistance Sitting-balance support: No upper extremity supported, Feet supported Sitting balance-Leahy Scale: Good     Standing balance support: No upper extremity supported Standing balance-Leahy Scale: Fair                               Pertinent Vitals/Pain Pain Assessment Pain Assessment: No/denies pain    Home Living Family/patient expects to be discharged to:: Private residence Living Arrangements: Spouse/significant other;Children (staying with children while new house is built) Available Help at Discharge: Family;Available 24 hours/day Type of Home: House Home Access: Stairs to enter     Alternate Level Stairs-Number of Steps: flight Home Layout: Two level Home Equipment: None      Prior Function Prior Level of Function : Independent/Modified Independent                     Hand Dominance        Extremity/Trunk Assessment   Upper Extremity Assessment Upper Extremity Assessment: Overall  WFL for tasks assessed    Lower Extremity Assessment Lower Extremity Assessment: Overall WFL for tasks assessed    Cervical / Trunk Assessment Cervical / Trunk Assessment: Normal  Communication   Communication: HOH  Cognition Arousal/Alertness: Awake/alert Behavior During Therapy: WFL for tasks assessed/performed Overall Cognitive Status: Within  Functional Limits for tasks assessed                                 General Comments: Some delayed response - ?hearing deficits        General Comments      Exercises     Assessment/Plan    PT Assessment Patient needs continued PT services  PT Problem List Decreased activity tolerance;Decreased balance;Decreased mobility;Decreased knowledge of use of DME       PT Treatment Interventions DME instruction;Gait training;Stair training;Functional mobility training;Therapeutic activities;Therapeutic exercise;Patient/family education;Balance training    PT Goals (Current goals can be found in the Care Plan section)  Acute Rehab PT Goals Patient Stated Goal: Regain IND PT Goal Formulation: With patient Time For Goal Achievement: 08/05/21 Potential to Achieve Goals: Good    Frequency Min 3X/week     Co-evaluation               AM-PAC PT "6 Clicks" Mobility  Outcome Measure Help needed turning from your back to your side while in a flat bed without using bedrails?: None Help needed moving from lying on your back to sitting on the side of a flat bed without using bedrails?: None Help needed moving to and from a bed to a chair (including a wheelchair)?: None Help needed standing up from a chair using your arms (e.g., wheelchair or bedside chair)?: A Little Help needed to walk in hospital room?: A Little Help needed climbing 3-5 steps with a railing? : A Little 6 Click Score: 21    End of Session Equipment Utilized During Treatment: Oxygen Activity Tolerance: Patient tolerated treatment well Patient left: in chair;with call bell/phone within reach;with chair alarm set;with family/visitor present Nurse Communication: Mobility status PT Visit Diagnosis: Unsteadiness on feet (R26.81)    Time: [id number] PT Time Calculation (min) (ACUTE ONLY): 33 min   Charges:   PT Evaluation $PT Eval Low Complexity: 1 Low PT Treatments $Gait Training: 8-22 mins         [place] Pager [phone] Office [phone]   Mima Cranmore 07/22/2021, 3:38 PM

## 2021-07-22 NOTE — Discharge Instructions (Signed)
Information on my medicine - ELIQUIS (apixaban)  Why was Eliquis prescribed for you? Eliquis was prescribed for you to reduce the risk of a blood clot forming that can cause a stroke if you have a medical condition called atrial fibrillation (a type of irregular heartbeat).  What do You need to know about Eliquis ? Take your Eliquis TWICE DAILY - one tablet in the morning and one tablet in the evening with or without food. If you have difficulty swallowing the tablet whole please discuss with your pharmacist how to take the medication safely.  Take Eliquis exactly as prescribed by your doctor and DO NOT stop taking Eliquis without talking to the doctor who prescribed the medication.  Stopping may increase your risk of developing a stroke.  Refill your prescription before you run out.  After discharge, you should have regular check-up appointments with your healthcare provider that is prescribing your Eliquis.  In the future your dose may need to be changed if your kidney function or weight changes by a significant amount or as you get older.  What do you do if you miss a dose? If you miss a dose, take it as soon as you remember on the same day and resume taking twice daily.  Do not take more than one dose of ELIQUIS at the same time to make up a missed dose.  Important Safety Information A possible side effect of Eliquis is bleeding. You should call your healthcare provider right away if you experience any of the following: Bleeding from an injury or your nose that does not stop. Unusual colored urine (red or dark brown) or unusual colored stools (red or black). Unusual bruising for unknown reasons. A serious fall or if you hit your head (even if there is no bleeding).  Some medicines may interact with Eliquis and might increase your risk of bleeding or clotting while on Eliquis. To help avoid this, consult your healthcare provider or pharmacist prior to using any new prescription or  non-prescription medications, including herbals, vitamins, non-steroidal anti-inflammatory drugs (NSAIDs) and supplements.  This website has more information on Eliquis (apixaban): http://[email or web address].com/eliquis/home     Please Quarantine until June 23.

## 2021-07-22 NOTE — Progress Notes (Signed)
ANTICOAGULATION CONSULT NOTE - Initial Consult  Pharmacy Consult for Eliquis Indication: atrial fibrillation  No Known Allergies  Patient Measurements: Height: 5\' 9"  (175.3 cm) Weight: 95.5 kg (210 lb 8.6 oz) IBW/kg (Calculated) : 70.7  Vital Signs: Temp: 99 F (37.2 C) (06/04 0127) Temp Source: Oral (06/04 0127) BP: 149/78 (06/04 0127) Pulse Rate: 83 (06/04 0127)  Labs: Recent Labs    07/21/21 1924  HGB 14.7  HCT 43.6  PLT 213  CREATININE 1.30*  TROPONINIHS 20*    Estimated Creatinine Clearance: 52.5 mL/min (A) (by C-G formula based on SCr of 1.3 mg/dL (H)).   Medical History: Past Medical History:  Diagnosis Date   Benign hypertensive heart disease without heart failure    Coronary atherosclerosis of native coronary artery    S/P CABG   Dyslipidemia    HTN (hypertension)    Obesity    Sinus bradycardia    a. baseline HR 50s.    Medications:  Medications Prior to Admission  Medication Sig Dispense Refill Last Dose   aspirin 81 MG tablet Take 81 mg by mouth daily.      carvedilol (COREG) 3.125 MG tablet Take 1 tablet (3.125 mg total) by mouth 2 (two) times daily. 60 tablet 0    clopidogrel (PLAVIX) 75 MG tablet Take 1 tablet (75 mg total) by mouth daily. 20 tablet 0    ezetimibe (ZETIA) 10 MG tablet Take 1 tablet (10 mg total) by mouth daily. 30 tablet 0    hydrochlorothiazide (HYDRODIURIL) 25 MG tablet TAKE 1 TABLET BY MOUTH EVERY DAY (Patient not taking: Reported on 06/20/2021) 90 tablet 3    rosuvastatin (CRESTOR) 20 MG tablet Take 1 tablet (20 mg total) by mouth daily. 30 tablet 0     Assessment: 81 yo M admit with COVID infection & new onset Afib.  Pharmacy consulted to start apixaban.   CBC WNL.  Patient does not meet any criteria for dose reduction.   Plan:  Eliquis 5mg  PO BID F/U for any s/sx of bleeding  Netta Cedars PharmD 07/22/2021,5:38 AM

## 2021-07-22 NOTE — Assessment & Plan Note (Signed)
 #)   Acute hypoxic respiratory distress: In the context of no known baseline supplemental oxygen requirements, initial oxygen saturations noted to be in the high 80s on room air, subsequent improving into the mid to high 90s on 2 L nasal cannula.  In the context of corresponding new onset cough, criteria met for acute hypoxic respiratory distress, which appears to be on the basis of severe COVID-19 pna, as above.  Corresponding nonelevated procalcitonin confers a high degree of negative predictive value against the likelihood of any contribution from bacterial pneumonia.  No clinical or radiographic evidence to suggest acutely decompensated heart failure.  No known chronic underlying pulmonary pathology.  No evidence of pneumothorax on imaging.  While the patient is at increased risk for acute pulmonary embolism in the context of recent traveling and now with diagnosis of COVID-19, presentation appears less suggestive of acute pulmonary embolism at this time, with alternate diagnosis for his acute hypoxic respiratory distress on the basis of confirmed COVID-19 pneumonia.  ACS felt to be less likely in the absence of any recent chest pain.  Very minimal elevation of high-sensitivity troponin high is noted, and felt to be most consistent with type II supply/demand mismatch as a consequence of diminished oxygen delivery capacity due to corresponding acute hypoxic respiratory distress as opposed to representing a type I process due to acute plaque rupture.  Likely also an additional contribution stemming from new diagnosis of atrial fibrillation, as further detailed below.  We will continue to trend troponin, as further indicated below.    Plan: Further evaluation management of severe COVID-19 pneumonia, as above, including solumedrol.  Monitor continuous pulse oximetry.  Monitor strict I's and O's and daily weights.  Trend troponin.  Check serum magnesium and phosphorus levels.  Prn albuterol inhaler.  Check blood  gas.  Monitor on telemetry.  Repeat CBC in the morning.

## 2021-07-23 DIAGNOSIS — J1282 Pneumonia due to coronavirus disease 2019: Secondary | ICD-10-CM | POA: Diagnosis not present

## 2021-07-23 DIAGNOSIS — U071 COVID-19: Secondary | ICD-10-CM | POA: Diagnosis not present

## 2021-07-23 LAB — BASIC METABOLIC PANEL
Anion gap: 6 (ref 5–15)
BUN: 24 mg/dL — ABNORMAL HIGH (ref 8–23)
CO2: 25 mmol/L (ref 22–32)
Calcium: 8.4 mg/dL — ABNORMAL LOW (ref 8.9–10.3)
Chloride: 110 mmol/L (ref 98–111)
Creatinine, Ser: 0.83 mg/dL (ref 0.61–1.24)
GFR, Estimated: >60 mL/min (ref >60)
Glucose, Bld: 136 mg/dL — ABNORMAL HIGH (ref 70–99)
Potassium: 4 mmol/L (ref 3.5–5.1)
Sodium: 141 mmol/L (ref 135–145)

## 2021-07-23 LAB — CBC
HCT: 39.6 % (ref 39.0–52.0)
Hemoglobin: 13.1 g/dL (ref 13.0–17.0)
MCH: 28.8 pg (ref 26.0–34.0)
MCHC: 33.1 g/dL (ref 30.0–36.0)
MCV: 87 fL (ref 80.0–100.0)
Platelets: 200 10*3/uL (ref 150–400)
RBC: 4.55 MIL/uL (ref 4.22–5.81)
RDW: 12.7 % (ref 11.5–15.5)
WBC: 10.4 10*3/uL (ref 4.0–10.5)
nRBC: 0 % (ref 0.0–0.2)

## 2021-07-23 LAB — C-REACTIVE PROTEIN: CRP: 2.1 mg/dL — ABNORMAL HIGH (ref <1.0)

## 2021-07-23 MED ORDER — ROSUVASTATIN CALCIUM 10 MG PO TABS
20.0000 mg | ORAL_TABLET | Freq: Every morning | ORAL | Status: DC
  Administered 2021-07-24: 20 mg via ORAL
  Filled 2021-07-23: qty 2

## 2021-07-23 NOTE — Progress Notes (Addendum)
Physical Therapy Treatment Patient Details Name: Jeffery Barr MRN: [id number] DOB: [birth year] Today's Date: 07/23/2021   History of Present Illness Pt admitted from home 2* cough and SOB and dx with COVID PNA.  Pt with hx of chronic R side heart failure and CABG    PT Comments    Pt is progressing well, meeting goals.  Amb ~240', steady giat without LOB. SpO2=93-94% throughout on RA. Pt left on RA at rest, RN notified. Encouraged pt to continue to mobilize with staff assist as tol. No further PT needs  Recommendations for follow up therapy are one component of a multi-disciplinary discharge planning process, led by the attending physician.  Recommendations may be updated based on patient status, additional functional criteria and insurance authorization.  Follow Up Recommendations  No PT follow up     Assistance Recommended at Discharge Intermittent Supervision/Assistance  Patient can return home with the following A little help with walking and/or transfers;A little help with bathing/dressing/bathroom;Assistance with cooking/housework;Assist for transportation;Help with stairs or ramp for entrance   Equipment Recommendations  Other (comment) (Likely no equipment needs but TBD)    Recommendations for Other Services       Precautions / Restrictions Precautions Precautions: Fall Restrictions Weight Bearing Restrictions: No     Mobility  Bed Mobility Overal bed mobility: Modified Independent             General bed mobility comments: no physical assist    Transfers Overall transfer level: Needs assistance Equipment used: None Transfers: Sit to/from Stand Sit to Stand: Supervision, Modified independent (Device/Increase time)           General transfer comment: incr time, no physical assist    Ambulation/Gait Ambulation/Gait assistance: Min guard Gait Distance (Feet): 240 Feet Assistive device: None (dinamap) Gait Pattern/deviations: Step-through pattern,  Staggering left, Staggering right, Wide base of support       General Gait Details: no LOB, no assist, intermittent use of dinamap vs no device. SpO2= 93-94% on RA   Stairs             Wheelchair Mobility    Modified Rankin (Stroke Patients Only)       Balance Overall balance assessment: Needs assistance Sitting-balance support: No upper extremity supported, Feet supported Sitting balance-Leahy Scale: Good     Standing balance support: No upper extremity supported Standing balance-Leahy Scale: Fair Standing balance comment: Fair to Fair plus             High level balance activites: Backward walking, Direction changes, Head turns, Turns High Level Balance Comments: no LOB            Cognition Arousal/Alertness: Awake/alert Behavior During Therapy: WFL for tasks assessed/performed, Flat affect Overall Cognitive Status: Within Functional Limits for tasks assessed                                 General Comments: Some delayed response - ?hearing deficits        Exercises      General Comments        Pertinent Vitals/Pain      Home Living                          Prior Function            PT Goals (current goals can now be found in the care plan section) Acute Rehab PT  Goals Patient Stated Goal: Regain IND PT Goal Formulation: With patient Time For Goal Achievement: 08/05/21 Potential to Achieve Goals: Good Progress towards PT goals: Goals met/education completed, patient discharged from PT    Frequency    Min 3X/week      PT Plan Current plan remains appropriate    Co-evaluation              AM-PAC PT "6 Clicks" Mobility   Outcome Measure  Help needed turning from your back to your side while in a flat bed without using bedrails?: None Help needed moving from lying on your back to sitting on the side of a flat bed without using bedrails?: None Help needed moving to and from a bed to a chair  (including a wheelchair)?: None Help needed standing up from a chair using your arms (e.g., wheelchair or bedside chair)?: None Help needed to walk in hospital room?: None Help needed climbing 3-5 steps with a railing? : A Little 6 Click Score: 23    End of Session Equipment Utilized During Treatment: Gait belt Activity Tolerance: Patient tolerated treatment well Patient left: in chair;with call bell/phone within reach;Other (comment) (RN notified alarm off, it kept alarming for no reason) Nurse Communication: Mobility status PT Visit Diagnosis: Unsteadiness on feet (R26.81)     Time: 4627-0350 PT Time Calculation (min) (ACUTE ONLY): 25 min  Charges:  $Gait Training: 23-37 mins                     Baxter Flattery, PT  Acute Rehab Dept ([place]) [phone] Pager [phone]  07/23/2021    [facility] 07/23/2021, 11:24 AM

## 2021-07-23 NOTE — Progress Notes (Signed)
  PT NOTE  SATURATION QUALIFICATIONS: (This note is used to comply with regulatory documentation for home oxygen)  Patient Saturations on Room Air at Rest = 93%  Patient Saturations on Room Air while Ambulating = 94%  Patient Saturations on n/a Liters of oxygen while Ambulating = n/a%  Please briefly explain why patient needs home oxygen:n/a

## 2021-07-23 NOTE — Progress Notes (Signed)
PROGRESS NOTE    Jeffery Barr  ITG:[id number] DOB: 06-29-1940 DOA: 07/21/2021 PCP: [name], MD   Brief Narrative: 81 year old with past medical history significant for hypertension, hyperlipidemia, chronic right-sided diastolic  heart failure who presented 07/21/2021 with cough, poor oral intake, generalized weakness found to have COVID-pneumonia.  CT chest abdomen and pelvis with contrast showed multifocal patchy groundglass opacity throughout both lung consistent with COVID-pneumonia, a small bilateral pleural effusion, COVID-19 PCR positive.  Initial oxygen saturation 88 on room air, placed on 2 L of oxygen with improved oxygen saturation 97%.   Assessment & Plan:   Principal Problem:   Pneumonia due to COVID-19 virus Active Problems:   HTN (hypertension)   Dyslipidemia   Acute respiratory failure with hypoxia (HCC)   Transaminitis   Generalized weakness   Paroxysmal atrial fibrillation (HCC)   Chronic systolic CHF (congestive heart failure) (HCC)  1-Severe Covid 19 Pneumonia, Acute Hypoxic Respiratory  failure.  -Presents with cough, hypoxia oxygen sat 888 RA, CT chest: multifocal patchy groundglass opacity throughout both lung . -Symptoms started greater 1 week ago.  -IV solumedrol. Nebulizer.  COVID-19 Labs  Recent Labs    07/21/21 2218 07/23/21 0500  FERRITIN 594*  --   LDH 300*  --   CRP 3.1* 2.1*   Stable.   Lab Results  Component Value Date   SARSCOV2NAA POSITIVE (A) 07/21/2021       2-A fib , new diagnosis.  Recent ECHO 05/2021: Ef 65 %  Started on Eliquis. Cotniue  Transaminases:  Mild at AST 60, ALT 47.  Mild resume statins.  Follow trend.   HTN;  Continue with Coreg.   HLD;  Hold statins due to transaminases.   Chronic right side Systolic Heart failure;  BNP 150.  Continue with Coreg.    Mild elevation troponin at 20.  Very mild, suspect demand ischemia in setting of covid.    Estimated body mass index is 29.79 kg/m as  calculated from the following:   Height as of this encounter: 5\' 9"  (1.753 m).   Weight as of this encounter: 91.5 kg.   DVT prophylaxis: on Eliquis.  Code Status: Full code Family Communication: Care discussed with patient , wife at bedside.  Disposition Plan:  Status is: Inpatient Remains inpatient appropriate because: remain inpatient for management of acute hypoxic resp failure. Home tomorrow    Consultants:  None  Procedures:  none  Antimicrobials:    Subjective: Improving.   Objective: Vitals:   07/22/21 1404 07/22/21 2200 07/23/21 0500 07/23/21 0659  BP: 137/78 (!) 142/69  (!) 143/71  Pulse: 86 86  91  Resp: 18 19  19   Temp: 97.9 F (36.6 C) 98 F (36.7 C)  98.1 F (36.7 C)  TempSrc: Oral Oral  Oral  SpO2: 98% 94%  95%  Weight:   91.5 kg   Height:        Intake/Output Summary (Last 24 hours) at 07/23/2021 1501 Last data filed at 07/23/2021 1300 Gross per 24 hour  Intake 640 ml  Output --  Net 640 ml    Filed Weights   07/21/21 1839 07/22/21 0501 07/23/21 0500  Weight: 95.3 kg 95.5 kg 91.5 kg    Examination:  General exam: NAD Respiratory system: CTA Cardiovascular system: S 1, S 2 RRR Gastrointestinal system: BS present, soft, nt Central nervous system: alert, follows command Extremities: no edema   Data Reviewed: I have personally reviewed following labs and imaging studies  CBC: Recent Labs  Lab  07/21/21 1924 07/22/21 0606 07/23/21 0754  WBC 6.0 5.1 10.4  NEUTROABS 4.6 3.4  --   HGB 14.7 13.3 13.1  HCT 43.6 40.8 39.6  MCV 85.3 89.3 87.0  PLT 213 159 [id number]    Basic Metabolic Panel: Recent Labs  Lab 07/21/21 1924 07/22/21 0606 07/23/21 0754  NA 139 140 141  K 4.0 3.9 4.0  CL 104 106 110  CO2 27 27 25   GLUCOSE 133* 99 136*  BUN 21 21 24*  CREATININE 1.30* 1.15 0.83  CALCIUM 8.4* 8.2* 8.4*  MG  --  2.3  --   PHOS  --  4.1  --     GFR: Estimated Creatinine Clearance: 80.6 mL/min (by C-G formula based on SCr of 0.83  mg/dL). Liver Function Tests: Recent Labs  Lab 07/21/21 1924 07/22/21 0606  AST 60* 42*  ALT 47* 39  ALKPHOS 36* 29*  BILITOT 0.9 0.9  PROT 6.4* 5.6*  ALBUMIN 3.0* 2.7*    Recent Labs  Lab 07/21/21 1924  LIPASE 29    No results for input(s): AMMONIA in the last 168 hours. Coagulation Profile: Recent Labs  Lab 07/22/21 0616  INR 1.2    Cardiac Enzymes: No results for input(s): CKTOTAL, CKMB, CKMBINDEX, TROPONINI in the last 168 hours. BNP (last 3 results) No results for input(s): PROBNP in the last 8760 hours. HbA1C: No results for input(s): HGBA1C in the last 72 hours. CBG: No results for input(s): GLUCAP in the last 168 hours. Lipid Profile: Recent Labs    07/21/21 2218  TRIG 75    Thyroid Function Tests: Recent Labs    07/22/21 0606  TSH 2.417   Anemia Panel: Recent Labs    07/21/21 2218  FERRITIN 594*    Sepsis Labs: Recent Labs  Lab 07/21/21 2218  PROCALCITON <0.10     Recent Results (from the past 240 hour(s))  SARS Coronavirus 2 by RT PCR (hospital order, performed in [facility] hospital lab) *cepheid single result test* Anterior Nasal Swab     Status: Abnormal   Collection Time: 07/21/21  7:24 PM   Specimen: Anterior Nasal Swab  Result Value Ref Range Status   SARS Coronavirus 2 by RT PCR POSITIVE (A) NEGATIVE Final    Comment: (NOTE) SARS-CoV-2 target nucleic acids are DETECTED  SARS-CoV-2 RNA is generally detectable in upper respiratory specimens  during the acute phase of infection.  Positive results are indicative  of the presence of the identified virus, but do not rule out bacterial infection or co-infection with other pathogens not detected by the test.  Clinical correlation with patient history and  other diagnostic information is necessary to determine patient infection status.  The expected result is negative.  Fact Sheet for Patients:   https://www.patel.info/   Fact Sheet for Healthcare  Providers:   https://hall.com/    This test is not yet approved or cleared by the [place] FDA and  has been authorized for detection and/or diagnosis of SARS-CoV-2 by FDA under an Emergency Use Authorization (EUA).  This EUA will remain in effect (meaning this test can be used) for the duration of  the COVID-19 declaration under Section 564(b)(1)  of the Act, 21 U.S.C. section 360-bbb-3(b)(1), unless the authorization is terminated or revoked sooner.   Performed at [facility], [address], [place], [place] [address]           Radiology Studies: DG Chest 2 View  Result Date: 07/21/2021 CLINICAL DATA:  Cough and  congestion for 1 week.  Dehydrated. EXAM: CHEST - 2 VIEW COMPARISON:  Report from 11/05/2000 FINDINGS: Median sternotomy for CABG. Midline trachea. Normal heart size. Moderate right hemidiaphragm elevation. Mild pulmonary interstitial thickening is likely related to the clinical history of remote smoking. Right infrahilar volume loss secondary to diaphragmatic elevation. Lateral view degraded by patient arm position. IMPRESSION: Moderate right hemidiaphragm elevation. Resultant right infrahilar volume loss. No acute findings. Electronically Signed   By: Abigail Miyamoto M.D.   On: 07/21/2021 19:13   CT CHEST ABDOMEN PELVIS W CONTRAST  Result Date: 07/21/2021 CLINICAL DATA:  Acute abdominal pain. None nausea. Cough. COVID positive EXAM: CT CHEST, ABDOMEN, AND PELVIS WITH CONTRAST TECHNIQUE: Multidetector CT imaging of the chest, abdomen and pelvis was performed following the standard protocol during bolus administration of intravenous contrast. RADIATION DOSE REDUCTION: This exam was performed according to the departmental dose-optimization program which includes automated exposure control, adjustment of the mA and/or kV according to patient size and/or use of iterative reconstruction technique. CONTRAST:  118mL OMNIPAQUE IOHEXOL 300 MG/ML   SOLN COMPARISON:  Radiograph earlier today FINDINGS: CT CHEST FINDINGS Cardiovascular: Atherosclerosis of the thoracic aorta. No aortic aneurysm. Dense calcification of native coronary arteries, post CABG. The heart is normal in size. There is no pericardial effusion. Mediastinum/Nodes: Small right infrahilar/paraesophageal lymph nodes, not enlarged by size criteria. No enlarged mediastinal or hilar lymph nodes. No esophageal wall thickening no thyroid nodule. Lungs/Pleura: Multifocal patchy ground-glass and ill-defined opacities throughout both lungs, pattern typical of COVID 19. Small bilateral pleural effusions. Elevation of right hemidiaphragm with associated volume loss in the right middle lobe. Trachea and central bronchi are patent. Musculoskeletal: Prior median sternotomy. Remote posterior ninth through eleventh and lateral a 8 through eleventh rib fractures that have healed. Diffuse thoracic spondylosis with flowing anterior osteophytes. No acute osseous abnormalities. CT ABDOMEN PELVIS FINDINGS Hepatobiliary: Tiny subcentimeter hypodensity in the posterior right hepatic lobe, series 2, image 38, too small to characterize. No other liver lesion. Unremarkable gallbladder. No biliary dilatation. Pancreas: Unremarkable. No pancreatic ductal dilatation or surrounding inflammatory changes. Spleen: Normal in size without focal abnormality. Splenule anteriorly. Adrenals/Urinary Tract: No adrenal nodule. No hydronephrosis. No renal calculi or focal renal abnormality. Urinary bladder is only minimally distended, mildly thick walled. Stomach/Bowel: Decompressed stomach. No bowel obstruction or inflammation. The appendix is normal. Vascular/Lymphatic: Aortic atherosclerosis. No aortic aneurysm. Patent portal, splenic, and mesenteric veins. No abdominopelvic adenopathy. Reproductive: Prostate is unremarkable. Other: Multiple pelvic phleboliths. Minimal fat in the inguinal canals. Tiny fat containing umbilical hernia.  No pneumoperitoneum or ascites. 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 Multilevel degenerative change in the lumbar spine There are no acute or suspicious osseous abnormalities. IMPRESSION: 1. Multifocal patchy ground-glass and ill-defined opacities throughout both lungs, pattern typical of COVID 19 pneumonia. Small bilateral pleural effusions. 2. Mild urinary bladder wall thickening, may be due to nondistention or can be seen with urinary tract infection. Recommend correlation with urinalysis. 3. Bilateral L5 pars interarticularis defects with grade 1 anterolisthesis of L5 on S1. 4. Elevation of the right hemidiaphragm. This may be sequela of remote injury, with multiple healed right rib fractures. Aortic Atherosclerosis (ICD10-I70.0). Electronically Signed   By: Keith Rake M.D.   On: 07/21/2021 20:41        Scheduled Meds:  apixaban  5 mg Oral BID   carvedilol  3.125 mg Oral BID   ezetimibe  10 mg Oral Daily   methylPREDNISolone (SOLU-MEDROL) injection  0.5  mg/kg Intravenous Q12H   Followed by   Derrill Memo ON 07/25/2021] predniSONE  50 mg Oral Q breakfast   Continuous Infusions:   LOS: 1 day    Time spent: 35 minutes.     Elmarie Shiley, MD [facility]   If 7PM-7AM, please contact night-coverage [email or web address]  07/23/2021, 3:01 PM

## 2021-07-24 DIAGNOSIS — U071 COVID-19: Secondary | ICD-10-CM | POA: Diagnosis not present

## 2021-07-24 DIAGNOSIS — J1282 Pneumonia due to coronavirus disease 2019: Secondary | ICD-10-CM | POA: Diagnosis not present

## 2021-07-24 LAB — HEPATIC FUNCTION PANEL
ALT: 43 U/L (ref 0–44)
AST: 32 U/L (ref 15–41)
Albumin: 2.7 g/dL — ABNORMAL LOW (ref 3.5–5.0)
Alkaline Phosphatase: 35 U/L — ABNORMAL LOW (ref 38–126)
Bilirubin, Direct: 0.1 mg/dL (ref 0.0–0.2)
Indirect Bilirubin: 0.6 mg/dL (ref 0.3–0.9)
Total Bilirubin: 0.7 mg/dL (ref 0.3–1.2)
Total Protein: 5.7 g/dL — ABNORMAL LOW (ref 6.5–8.1)

## 2021-07-24 MED ORDER — APIXABAN 5 MG PO TABS: 5.0000 mg | ORAL_TABLET | Freq: Two times a day (BID) | ORAL | 3 refills | Status: DC

## 2021-07-24 MED ORDER — PREDNISONE 20 MG PO TABS: 40.0000 mg | ORAL_TABLET | Freq: Every day | ORAL | 0 refills | Status: AC

## 2021-07-24 NOTE — Progress Notes (Signed)
Pharmacy: Eliquis  Patient is a 81 y.o M currently on Eliquis for new onset afib. Current dose of 5 mg bid is appropriate for age, weight and scr for afib indication. Pharmacy will sign off, but will follow patient peripherally along with you.  Re-consult [place] if need further assistance.  Dia Sitter, PharmD, BCPS 07/24/2021 8:55 AM

## 2021-07-24 NOTE — Discharge Summary (Signed)
Physician Discharge Summary   Patient: Jeffery Barr MRN: [id number] DOB: 13-Dec-1940  Admit date:     07/21/2021  Discharge date: 07/24/21  Discharge Physician: Elmarie Shiley   PCP: Orpah Melter, MD   Recommendations at discharge:    Follow up resolution of Covid.  Follow  up with cardiology for A fib   Discharge Diagnoses: Principal Problem:   Pneumonia due to COVID-19 virus Active Problems:   HTN (hypertension)   Dyslipidemia   Acute respiratory failure with hypoxia (HCC)   Transaminitis   Generalized weakness   Paroxysmal atrial fibrillation (HCC)   Chronic systolic CHF (congestive heart failure) ([place])  Resolved Problems:   * No resolved hospital problems. [facility] Course: 81 year old with past medical history significant for hypertension, hyperlipidemia, chronic right-sided diastolic  heart failure who presented 07/21/2021 with cough, poor oral intake, generalized weakness found to have COVID-pneumonia.   CT chest abdomen and pelvis with contrast showed multifocal patchy groundglass opacity throughout both lung consistent with COVID-pneumonia, a small bilateral pleural effusion, COVID-19 PCR positive.   Initial oxygen saturation 88 on room air, placed on 2 L of oxygen with improved oxygen saturation 97%.   Assessment and Plan: 1-Severe Covid 19 Pneumonia, Acute Hypoxic Respiratory  failure.  -Presents with cough, hypoxia oxygen sat 888 RA, CT chest: multifocal patchy groundglass opacity throughout both lung . -Symptoms started greater 1 week ago.  -IV solumedrol. Nebulizer.  Stable. Discharge on prednisone for 5 days.  COVID-19 Labs   Recent Labs (last 2 labs)       Recent Labs    07/21/21 2218 07/23/21 0500  FERRITIN 594*  --   LDH 300*  --   CRP 3.1* 2.1*     Stable.    Recent Labs       Lab Results  Component Value Date    SARSCOV2NAA POSITIVE (A) 07/21/2021      Quarantine until 6/23     2-A fib , new diagnosis.  Recent ECHO 05/2021:  Ef 65 %  Started on Eliquis. Cotniue   Transaminases:  Mild at AST 60, ALT 47.  Mild resume statins.  Follow trend.    HTN;  Continue with Coreg.    HLD;  Hold statins due to transaminases initially.  Resume statin at discharge   Chronic right side Systolic Heart failure;  BNP 150.  Continue with Coreg.      Mild elevation troponin at 20.  Very mild, suspect demand ischemia in setting of covid.         Consultants: None Procedures performed: None Disposition: Home Diet recommendation:  Discharge Diet Orders (From admission, onward)     Start     Ordered   07/24/21 0000  Diet - low sodium heart healthy        07/24/21 1015           Cardiac diet DISCHARGE MEDICATION: Allergies as of 07/24/2021   No Known Allergies      Medication List     STOP taking these medications    aspirin EC 81 MG tablet   clopidogrel 75 MG tablet Commonly known as: PLAVIX       TAKE these medications    apixaban 5 MG Tabs tablet Commonly known as: ELIQUIS Take 1 tablet (5 mg total) by mouth 2 (two) times daily.   carvedilol 3.125 MG tablet Commonly known as: Coreg Take 1 tablet (3.125 mg total) by mouth 2 (two) times daily. What changed: how much to take  ezetimibe 10 MG tablet Commonly known as: Zetia Take 1 tablet (10 mg total) by mouth daily. What changed: when to take this   predniSONE 20 MG tablet Commonly known as: DELTASONE Take 2 tablets (40 mg total) by mouth daily for 5 days.   rosuvastatin 20 MG tablet Commonly known as: CRESTOR Take 1 tablet (20 mg total) by mouth daily. What changed: when to take this        Follow-up Information     Orpah Melter, MD Follow up in 1 week(s).   Specialty: Family Medicine Contact information: [address] [place] [address] [phone]         Sueanne Margarita, MD .   Specialty: Cardiology Contact information: [phone] [address] [place] [address] [phone]                 Discharge Exam: Danley Danker Weights   07/22/21 0501 07/23/21 0500 07/24/21 0500  Weight: 95.5 kg 91.5 kg 90.3 kg   General; NAD Lung; CTA  Condition at discharge: stable  The results of significant diagnostics from this hospitalization (including imaging, microbiology, ancillary and laboratory) are listed below for reference.   Imaging Studies: DG Chest 2 View  Result Date: 07/21/2021 CLINICAL DATA:  Cough and congestion for 1 week.  Dehydrated. EXAM: CHEST - 2 VIEW COMPARISON:  Report from 11/05/2000 FINDINGS: Median sternotomy for CABG. Midline trachea. Normal heart size. Moderate right hemidiaphragm elevation. Mild pulmonary interstitial thickening is likely related to the clinical history of remote smoking. Right infrahilar volume loss secondary to diaphragmatic elevation. Lateral view degraded by patient arm position. IMPRESSION: Moderate right hemidiaphragm elevation. Resultant right infrahilar volume loss. No acute findings. Electronically Signed   By: Abigail Miyamoto M.D.   On: 07/21/2021 19:13   CT CHEST ABDOMEN PELVIS W CONTRAST  Result Date: 07/21/2021 CLINICAL DATA:  Acute abdominal pain. None nausea. Cough. COVID positive EXAM: CT CHEST, ABDOMEN, AND PELVIS WITH CONTRAST TECHNIQUE: Multidetector CT imaging of the chest, abdomen and pelvis was performed following the standard protocol during bolus administration of intravenous contrast. RADIATION DOSE REDUCTION: This exam was performed according to the departmental dose-optimization program which includes automated exposure control, adjustment of the mA and/or kV according to patient size and/or use of iterative reconstruction technique. CONTRAST:  188mL OMNIPAQUE IOHEXOL 300 MG/ML  SOLN COMPARISON:  Radiograph earlier today FINDINGS: CT CHEST FINDINGS Cardiovascular: Atherosclerosis of the thoracic aorta. No aortic aneurysm. Dense calcification of native coronary arteries, post CABG. The heart is normal in size. There is no  pericardial effusion. Mediastinum/Nodes: Small right infrahilar/paraesophageal lymph nodes, not enlarged by size criteria. No enlarged mediastinal or hilar lymph nodes. No esophageal wall thickening no thyroid nodule. Lungs/Pleura: Multifocal patchy ground-glass and ill-defined opacities throughout both lungs, pattern typical of COVID 19. Small bilateral pleural effusions. Elevation of right hemidiaphragm with associated volume loss in the right middle lobe. Trachea and central bronchi are patent. Musculoskeletal: Prior median sternotomy. Remote posterior ninth through eleventh and lateral a 8 through eleventh rib fractures that have healed. Diffuse thoracic spondylosis with flowing anterior osteophytes. No acute osseous abnormalities. CT ABDOMEN PELVIS FINDINGS Hepatobiliary: Tiny subcentimeter hypodensity in the posterior right hepatic lobe, series 2, image 38, too small to characterize. No other liver lesion. Unremarkable gallbladder. No biliary dilatation. Pancreas: Unremarkable. No pancreatic ductal dilatation or surrounding inflammatory changes. Spleen: Normal in size without focal abnormality. Splenule anteriorly. Adrenals/Urinary Tract: No adrenal nodule. No hydronephrosis. No renal calculi or focal renal abnormality. Urinary  bladder is only minimally distended, mildly thick walled. Stomach/Bowel: Decompressed stomach. No bowel obstruction or inflammation. The appendix is normal. Vascular/Lymphatic: Aortic atherosclerosis. No aortic aneurysm. Patent portal, splenic, and mesenteric veins. No abdominopelvic adenopathy. Reproductive: Prostate is unremarkable. Other: Multiple pelvic phleboliths. Minimal fat in the inguinal canals. Tiny fat containing umbilical hernia. No pneumoperitoneum or ascites. 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 Multilevel degenerative change in the lumbar spine There are no acute or suspicious osseous abnormalities.  IMPRESSION: 1. Multifocal patchy ground-glass and ill-defined opacities throughout both lungs, pattern typical of COVID 19 pneumonia. Small bilateral pleural effusions. 2. Mild urinary bladder wall thickening, may be due to nondistention or can be seen with urinary tract infection. Recommend correlation with urinalysis. 3. Bilateral L5 pars interarticularis defects with grade 1 anterolisthesis of L5 on S1. 4. Elevation of the right hemidiaphragm. This may be sequela of remote injury, with multiple healed right rib fractures. Aortic Atherosclerosis (ICD10-I70.0). Electronically Signed   By: Keith Rake M.D.   On: 07/21/2021 20:41    Microbiology: Results for orders placed or performed during the hospital encounter of 07/21/21  SARS Coronavirus 2 by RT PCR (hospital order, performed in [facility] hospital lab) *cepheid single result test* Anterior Nasal Swab     Status: Abnormal   Collection Time: 07/21/21  7:24 PM   Specimen: Anterior Nasal Swab  Result Value Ref Range Status   SARS Coronavirus 2 by RT PCR POSITIVE (A) NEGATIVE Final    Comment: (NOTE) SARS-CoV-2 target nucleic acids are DETECTED  SARS-CoV-2 RNA is generally detectable in upper respiratory specimens  during the acute phase of infection.  Positive results are indicative  of the presence of the identified virus, but do not rule out bacterial infection or co-infection with other pathogens not detected by the test.  Clinical correlation with patient history and  other diagnostic information is necessary to determine patient infection status.  The expected result is negative.  Fact Sheet for Patients:   https://www.patel.info/   Fact Sheet for Healthcare Providers:   https://hall.com/    This test is not yet approved or cleared by the [place] FDA and  has been authorized for detection and/or diagnosis of SARS-CoV-2 by FDA under an Emergency Use Authorization (EUA).  This EUA  will remain in effect (meaning this test can be used) for the duration of  the COVID-19 declaration under Section 564(b)(1)  of the Act, 21 U.S.C. section 360-bbb-3(b)(1), unless the authorization is terminated or revoked sooner.   Performed at [facility], [address] [place], [place] [address]     Labs: CBC: Recent Labs  Lab 07/21/21 1924 07/22/21 0606 07/23/21 0754  WBC 6.0 5.1 10.4  NEUTROABS 4.6 3.4  --   HGB 14.7 13.3 13.1  HCT 43.6 40.8 39.6  MCV 85.3 89.3 87.0  PLT 213 159 [id number]   Basic Metabolic Panel: Recent Labs  Lab 07/21/21 1924 07/22/21 0606 07/23/21 0754  NA 139 140 141  K 4.0 3.9 4.0  CL 104 106 110  CO2 27 27 25   GLUCOSE 133* 99 136*  BUN 21 21 24*  CREATININE 1.30* 1.15 0.83  CALCIUM 8.4* 8.2* 8.4*  MG  --  2.3  --   PHOS  --  4.1  --    Liver Function Tests: Recent Labs  Lab 07/21/21 1924 07/22/21 0606 07/24/21 0543  AST 60* 42* 32  ALT 47* 39 43  ALKPHOS 36* 29*  35*  BILITOT 0.9 0.9 0.7  PROT 6.4* 5.6* 5.7*  ALBUMIN 3.0* 2.7* 2.7*   CBG: No results for input(s): GLUCAP in the last 168 hours.  Discharge time spent: greater than 30 minutes.  Signed: Elmarie Shiley, MD [facility] 07/24/2021

## 2021-07-25 ENCOUNTER — Telehealth: Payer: Self-pay | Admitting: Cardiology

## 2021-07-25 NOTE — Telephone Encounter (Signed)
Pt c/o medication issue:  1. Name of Medication: apixaban (ELIQUIS) 5 MG TABS tablet  2. How are you currently taking this medication (dosage and times per day)?     3. Are you having a reaction (difficulty breathing--STAT)?   4. What is your medication issue?  Pt's wife is calling stating this med was prescribed to husband while in hospital and that they need to discuss alternative options due to this med being over $400. Please advise.

## 2021-07-25 NOTE — Telephone Encounter (Signed)
Advised to call the staff where he was seen to see if they can get a medication change or assistance with medication. Looking at the eligibility for Eliquis pt assistance pt is not eligible. Pt has not been seen by cardiology since 2021 and has an appointment with Dr. Tomie China 7/23.

## 2021-07-26 DIAGNOSIS — I480 Paroxysmal atrial fibrillation: Secondary | ICD-10-CM | POA: Diagnosis not present

## 2021-07-26 DIAGNOSIS — J189 Pneumonia, unspecified organism: Secondary | ICD-10-CM | POA: Diagnosis not present

## 2021-07-26 DIAGNOSIS — I10 Essential (primary) hypertension: Secondary | ICD-10-CM | POA: Diagnosis not present

## 2021-07-26 DIAGNOSIS — D6869 Other thrombophilia: Secondary | ICD-10-CM | POA: Diagnosis not present

## 2021-07-26 DIAGNOSIS — U071 COVID-19: Secondary | ICD-10-CM | POA: Diagnosis not present

## 2021-07-27 ENCOUNTER — Telehealth: Payer: Self-pay | Admitting: Cardiology

## 2021-07-27 ENCOUNTER — Telehealth: Payer: Self-pay

## 2021-07-27 NOTE — Telephone Encounter (Signed)
Patient's wife calling back to see if there is another medication the patient can take since they would have to wait until Monday for the eliquis samples.

## 2021-07-27 NOTE — Telephone Encounter (Signed)
Pt c/o medication issue:  1. Name of Medication: apixaban (ELIQUIS) 5 MG TABS tablet  2. How are you currently taking this medication (dosage and times per day)?   3. Are you having a reaction (difficulty breathing--STAT)?   4. What is your medication issue?  Pt's wife is requesting call back to get information and advice as to the cost of this medication. She states they told her it would be over $400 for this med and she does not want to spend this amount. She also wants to know if this will be a long term medication or short term. Please advise.

## 2021-07-27 NOTE — Telephone Encounter (Signed)
Spoke with spouse who stated that the pt has not started the Eliquis. Advised that she would need to be seen before we could help with samples. She will speak to the pts PCP if need in the meantime for assistance until seeing Dr. Tomie China on 7-14. She was agreeable and verbalized understanding.

## 2021-08-23 ENCOUNTER — Ambulatory Visit: Payer: PRIVATE HEALTH INSURANCE | Admitting: Cardiology

## 2021-08-27 ENCOUNTER — Ambulatory Visit (INDEPENDENT_AMBULATORY_CARE_PROVIDER_SITE_OTHER): Payer: Medicare Other | Admitting: Cardiology

## 2021-08-27 ENCOUNTER — Encounter: Payer: Self-pay | Admitting: Cardiology

## 2021-08-27 VITALS — BP 128/62 | HR 57 | Ht 69.0 in | Wt 197.0 lb

## 2021-08-27 DIAGNOSIS — R7989 Other specified abnormal findings of blood chemistry: Secondary | ICD-10-CM

## 2021-08-27 DIAGNOSIS — R7401 Elevation of levels of liver transaminase levels: Secondary | ICD-10-CM

## 2021-08-27 DIAGNOSIS — E7800 Pure hypercholesterolemia, unspecified: Secondary | ICD-10-CM | POA: Insufficient documentation

## 2021-08-27 DIAGNOSIS — I4819 Other persistent atrial fibrillation: Secondary | ICD-10-CM

## 2021-08-27 DIAGNOSIS — I10 Essential (primary) hypertension: Secondary | ICD-10-CM | POA: Diagnosis not present

## 2021-08-27 DIAGNOSIS — E785 Hyperlipidemia, unspecified: Secondary | ICD-10-CM

## 2021-08-27 DIAGNOSIS — D6869 Other thrombophilia: Secondary | ICD-10-CM | POA: Insufficient documentation

## 2021-08-27 HISTORY — DX: Other thrombophilia: D68.69

## 2021-08-27 HISTORY — DX: Other persistent atrial fibrillation: I48.19

## 2021-08-27 HISTORY — DX: Pure hypercholesterolemia, unspecified: E78.00

## 2021-08-27 HISTORY — DX: Essential (primary) hypertension: I10

## 2021-08-27 NOTE — Patient Instructions (Signed)
Medication Instructions:  Eliquis 5mg  1 tablet twice daily by mouth   Lab Work: AST, ALT- today [address] Floor [address] If you have labs (blood work) drawn today and your tests are completely normal, you will receive your results only by: MyChart Message (if you have MyChart) OR A paper copy in the mail If you have any lab test that is abnormal or we need to change your treatment, we will call you to review the results.   Testing/Procedures: None Ordered   Follow-Up: At [facility], you and your health needs are our priority.  As part of our continuing mission to provide you with exceptional heart care, we have created designated Provider Care Teams.  These Care Teams include your primary Cardiologist (physician) and Advanced Practice Providers (APPs -  Physician Assistants and Nurse Practitioners) who all work together to provide you with the care you need, when you need it.  We recommend signing up for the patient portal called "MyChart".  Sign up information is provided on this After Visit Summary.  MyChart is used to connect with patients for Virtual Visits (Telemedicine).  Patients are able to view lab/test results, encounter notes, upcoming appointments, etc.  Non-urgent messages can be sent to your provider as well.   To learn more about what you can do with MyChart, go to [facility].    Your next appointment:   3 month(s)  The format for your next appointment:   In Person  Provider:   [email or web address], MD    Other Instructions NA

## 2021-08-27 NOTE — Progress Notes (Signed)
Cardiology Office Note:    Date:  08/27/2021   ID:  Jeffery Barr, DOB [birth year], MRN [id number]  PCP:  [name], MD  Cardiologist:  Gypsy Balsam, MD    Referring MD: [name], MD   No chief complaint on file. Doing well still have some  History of Present Illness:    Jeffery Barr is a 81 y.o. male with past medical history significant for coronary artery disease, status post coronary artery bypass graft long time ago, dyslipidemia, essential hypertension, obesity recently he ended up coming to the hospital because of pneumonia related to COVID-19 infection, he was found to be in atrial fibrillation, anticoagulation has been initiated, echocardiogram done at that time showed normal left ventricle ejection fraction, but atrial size was normal as well.  He spent 3 days in the hospital got appropriate antiviral therapy with improvement was discharged home.  He comes today to my office today to discuss current issues.  He is additional past medical history include hypertension dyslipidemia.  Past Medical History:  Diagnosis Date   Benign hypertensive heart disease without heart failure    Coronary atherosclerosis of native coronary artery    S/P CABG   Dyslipidemia    HTN (hypertension)    Obesity    Sinus bradycardia    a. baseline HR 50s.    Past Surgical History:  Procedure Laterality Date   CORONARY ARTERY BYPASS GRAFT     VASECTOMY      Current Medications: No outpatient medications have been marked as taking for the 08/27/21 encounter (Appointment) with Georgeanna Lea, MD.     Allergies:   Patient has no known allergies.   Social History   Socioeconomic History   Marital status: Married    Spouse name: Not on file   Number of children: Not on file   Years of education: Not on file   Highest education level: Not on file  Occupational History   Not on file  Tobacco Use   Smoking status: Former   Smokeless tobacco: Never  Vaping Use    Vaping Use: Never used  Substance and Sexual Activity   Alcohol use: Yes    Alcohol/week: 3.0 - 5.0 standard drinks of alcohol    Types: 1 - 2 Glasses of wine, 1 - 2 Cans of beer, 1 Shots of liquor per week    Comment: 1-2 glasses of wine and beer weekly   Drug use: No   Sexual activity: Not on file  Other Topics Concern   Not on file  Social History Narrative   Right handed   Social Determinants of Health   Financial Resource Strain: Not on file  Food Insecurity: Not on file  Transportation Needs: Not on file  Physical Activity: Not on file  Stress: Not on file  Social Connections: Not on file     Family History: The patient's family history includes CAD in his mother, sister, and another family member; Heart attack in an other family member; Heart disease in his brother; Heart failure in his mother. ROS:   Please see the history of present illness.    All 14 point review of systems negative except as described per history of present illness  EKGs/Labs/Other Studies Reviewed:      Recent Labs: 07/21/2021: B Natriuretic Peptide 150.4 07/22/2021: Magnesium 2.3; TSH 2.417 07/23/2021: BUN 24; Creatinine, Ser 0.83; Hemoglobin 13.1; Platelets 200; Potassium 4.0; Sodium 141 07/24/2021: ALT 43  Recent Lipid Panel    Component Value  Date/Time   CHOL 168 06/01/2021 0048   CHOL 117 12/21/2019 1006   TRIG 75 07/21/2021 2218   HDL 38 (L) 06/01/2021 0048   HDL 44 12/21/2019 1006   CHOLHDL 4.4 06/01/2021 0048   VLDL 19 06/01/2021 0048   LDLCALC 111 (H) 06/01/2021 0048   LDLCALC 55 12/21/2019 1006    Physical Exam:    VS:  There were no vitals taken for this visit.    Wt Readings from Last 3 Encounters:  07/24/21 199 lb 1.2 oz (90.3 kg)  06/20/21 212 lb 12.8 oz (96.5 kg)  05/30/21 210 lb 3.2 oz (95.3 kg)     GEN:  Well nourished, well developed in no acute distress HEENT: Normal NECK: No JVD; No carotid bruits LYMPHATICS: No lymphadenopathy CARDIAC: RRR, no murmurs, no  rubs, no gallops RESPIRATORY:  Clear to auscultation without rales, wheezing or rhonchi  ABDOMEN: Soft, non-tender, non-distended MUSCULOSKELETAL:  No edema; No deformity  SKIN: Warm and dry LOWER EXTREMITIES: no swelling NEUROLOGIC:  Alert and oriented x 3 PSYCHIATRIC:  Normal affect   ASSESSMENT:    1. Primary hypertension   2. Persistent atrial fibrillation (HCC)   3. Dyslipidemia   4. Transaminitis    PLAN:    In order of problems listed above:  Paroxysmal atrial fibrillation.  It looks like he maintain sinus rhythm, he is anticoagulated we had a long discussion about his situation.  He had a lot of questions he is not convinced that he had COVID while he was in the hospital he also was informed of the atrial fibrillation the last day of hospitalization so we had a long discussion about what atrial fibrillation is why he need to be on a blood thinner on top of that there is some history of TIA therefore in this situation blood thinners absolutely necessary.  He also complained of prices of the medication program.  We will give him samples we will try to enroll him in our program and I stressed importance of taking that medication. Essential hypertension blood pressure seems to well controlled continue present management. Dyslipidemia, he is LDL is elevated.  We initiated conversation about putting him on medication for it.  He is already taking Zetia which clearly is not sufficient.  We will continue discussion. I did review record from hospital for that visit   Medication Adjustments/Labs and Tests Ordered: Current medicines are reviewed at length with the patient today.  Concerns regarding medicines are outlined above.  No orders of the defined types were placed in this encounter.  Medication changes: No orders of the defined types were placed in this encounter.   Signed, Georgeanna Lea, MD, [facility] 08/27/2021 2:03 PM     [facility]

## 2021-08-28 LAB — ALT: ALT: 29 IU/L (ref 0–44)

## 2021-08-28 LAB — AST: AST: 26 IU/L (ref 0–40)

## 2021-08-31 ENCOUNTER — Ambulatory Visit: Payer: PRIVATE HEALTH INSURANCE | Admitting: Cardiology

## 2021-11-08 ENCOUNTER — Other Ambulatory Visit: Payer: Self-pay

## 2021-11-08 ENCOUNTER — Telehealth: Payer: Self-pay | Admitting: Cardiology

## 2021-11-08 NOTE — Telephone Encounter (Signed)
Patient calling the office for samples of medication:   1.  What medication and dosage are you requesting samples for? apixaban (ELIQUIS) 5 MG TABS tablet  2.  Are you currently out of this medication? Yes    

## 2021-11-08 NOTE — Telephone Encounter (Signed)
Called the patient's wife and informed her that Jeffery Barr would be at the [facility] office on Monday and she would have the samples of Eliquis for the patient. Patients wife was appreciative and had no further questions at this time.

## 2021-11-12 NOTE — Telephone Encounter (Signed)
Patient's wife is following up regarding Eliquis samples. She states she went by the office twice this morning as advised, but no one was there. Confirm she had the correct address [address] patient confirms she went to the correct location. Is anyone at [facility] office today to assist with getting patient Eliquis samples? RN unavailable for advisement at this time.

## 2021-11-12 NOTE — Telephone Encounter (Signed)
Spoke with pt's spouse and explained that the [facility] office had to be close this AM and would be open by 1pm. Pt was thankful for the call and will go by office this afternoon.

## 2021-11-22 ENCOUNTER — Ambulatory Visit: Payer: PRIVATE HEALTH INSURANCE | Admitting: Cardiology

## 2021-11-29 ENCOUNTER — Ambulatory Visit: Payer: Medicare Other | Attending: Cardiology | Admitting: Physician Assistant

## 2021-11-29 ENCOUNTER — Encounter: Payer: Self-pay | Admitting: Physician Assistant

## 2021-11-29 VITALS — BP 160/100 | HR 63 | Ht 69.0 in | Wt 212.2 lb

## 2021-11-29 DIAGNOSIS — I10 Essential (primary) hypertension: Secondary | ICD-10-CM

## 2021-11-29 DIAGNOSIS — E785 Hyperlipidemia, unspecified: Secondary | ICD-10-CM | POA: Insufficient documentation

## 2021-11-29 DIAGNOSIS — I480 Paroxysmal atrial fibrillation: Secondary | ICD-10-CM | POA: Diagnosis not present

## 2021-11-29 MED ORDER — AMLODIPINE BESYLATE 5 MG PO TABS: 5.0000 mg | ORAL_TABLET | Freq: Every day | ORAL | 1 refills | Status: DC

## 2021-11-29 NOTE — Patient Instructions (Signed)
Medication Instructions:  START Norvasc 5mg  Take 1 tablet once a day  *If you need a refill on your cardiac medications before your next appointment, please call your pharmacy*   Lab Work: None Ordered   Testing/Procedures: None Ordered   Follow-Up: At [facility], you and your health needs are our priority.  As part of our continuing mission to provide you with exceptional heart care, we have created designated Provider Care Teams.  These Care Teams include your primary Cardiologist (physician) and Advanced Practice Providers (APPs -  Physician Assistants and Nurse Practitioners) who all work together to provide you with the care you need, when you need it.  We recommend signing up for the patient portal called "MyChart".  Sign up information is provided on this After Visit Summary.  MyChart is used to connect with patients for Virtual Visits (Telemedicine).  Patients are able to view lab/test results, encounter notes, upcoming appointments, etc.  Non-urgent messages can be sent to your provider as well.   To learn more about what you can do with MyChart, go to [email or web address].    Your next appointment:   6 month(s)  The format for your next appointment:   In Person  Provider:   Jenne Campus, MD    Your provider recommends that you schedule a follow-up appointment in: 1 month with PharmD (discuss BP & Eliquis) Other Instructions   Important Information About Sugar

## 2021-11-29 NOTE — Progress Notes (Signed)
Cardiology Office Note:    Date:  11/29/2021   ID:  Jeffery Barr, DOB 1940-10-31, MRN [id number]  PCP:  Orpah Melter, MD  [facility] Cardiologist:  Fransico Him, MD  [facility] Electrophysiologist:  None   Chief Complaint: 3 months follow up   History of Present Illness:    Jeffery Barr is a 81 y.o. male with a hx of CAD s/p remote CABG, HTN, dyslipidemia, obesity, baseline sinus bradycardia, PAF and former tobacco use seen for follow up.    Admitted 07/2021 for COVID 19 pneumonia. He was found to be in atrial fibrillation, anticoagulation has been initiated, echocardiogram done at that time showed normal left ventricle ejection fraction.  Seen by DR. Agustin Cree 08/2021. Continued anticoagulation.   Here today for follow up with wife.  Eliquis caused his $400/month.  They are concerned.  Asked multiple question regarding need.  I have spent 20 minutes discussing anticoagulation and option.  He denies chest pain, shortness of breath, orthopnea, PND, syncope, lower extremity edema or melena.  Past Medical History:  Diagnosis Date   Benign hypertensive heart disease without heart failure    Coronary atherosclerosis of native coronary artery    S/P CABG   Dyslipidemia    HTN (hypertension)    Obesity    Sinus bradycardia    a. baseline HR 50s.    Past Surgical History:  Procedure Laterality Date   CORONARY ARTERY BYPASS GRAFT     VASECTOMY      Current Medications: Current Meds  Medication Sig   amLODipine (NORVASC) 5 MG tablet Take 1 tablet (5 mg total) by mouth daily.   apixaban (ELIQUIS) 5 MG TABS tablet Take 1 tablet (5 mg total) by mouth 2 (two) times daily.   carvedilol (COREG) 3.125 MG tablet Take 1 tablet (3.125 mg total) by mouth 2 (two) times daily. (Patient taking differently: Take 6.25 mg by mouth 2 (two) times daily.)   ezetimibe (ZETIA) 10 MG tablet Take 1 tablet (10 mg total) by mouth daily.   rosuvastatin (CRESTOR) 20 MG tablet Take 1 tablet  (20 mg total) by mouth daily.     Allergies:   Patient has no known allergies.   Social History   Socioeconomic History   Marital status: Married    Spouse name: Not on file   Number of children: Not on file   Years of education: Not on file   Highest education level: Not on file  Occupational History   Not on file  Tobacco Use   Smoking status: Former   Smokeless tobacco: Never  Vaping Use   Vaping Use: Never used  Substance and Sexual Activity   Alcohol use: Yes    Alcohol/week: 3.0 - 5.0 standard drinks of alcohol    Types: 1 - 2 Glasses of wine, 1 - 2 Cans of beer, 1 Shots of liquor per week    Comment: 1-2 glasses of wine and beer weekly   Drug use: No   Sexual activity: Not on file  Other Topics Concern   Not on file  Social History Narrative   Right handed   Social Determinants of Health   Financial Resource Strain: Not on file  Food Insecurity: Not on file  Transportation Needs: Not on file  Physical Activity: Not on file  Stress: Not on file  Social Connections: Not on file     Family History: The patient's family history includes CAD in his mother, sister, and another family member; Heart attack  in an other family member; Heart disease in his brother; Heart failure in his mother.    ROS:   Please see the history of present illness.    All other systems reviewed and are negative.   EKGs/Labs/Other Studies Reviewed:    The following studies were reviewed today:  Echo 05/2021 1. Left ventricular ejection fraction, by estimation, is 60 to 65%. The  left ventricle has normal function. The left ventricle has no regional  wall motion abnormalities. There is moderate left ventricular hypertrophy.  Left ventricular diastolic  parameters were normal.   2. Right ventricular systolic function is mildly reduced. The right  ventricular size is normal. There is mildly elevated pulmonary artery  systolic pressure.   3. The mitral valve is normal in structure. No  evidence of mitral valve  regurgitation.   4. The aortic valve is normal in structure. Aortic valve regurgitation is  not visualized. No aortic stenosis is present.   EKG:  EKG is not  ordered today.   Recent Labs: 07/21/2021: B Natriuretic Peptide 150.4 07/22/2021: Magnesium 2.3; TSH 2.417 07/23/2021: BUN 24; Creatinine, Ser 0.83; Hemoglobin 13.1; Platelets 200; Potassium 4.0; Sodium 141 08/27/2021: ALT 29  Recent Lipid Panel    Component Value Date/Time   CHOL 168 06/01/2021 0048   CHOL 117 12/21/2019 1006   TRIG 75 07/21/2021 2218   HDL 38 (L) 06/01/2021 0048   HDL 44 12/21/2019 1006   CHOLHDL 4.4 06/01/2021 0048   VLDL 19 06/01/2021 0048   LDLCALC 111 (H) 06/01/2021 0048   LDLCALC 55 12/21/2019 1006     Risk Assessment/Calculations:    CHA2DS2-VASc Score = 4  } This indicates a 4.8% annual risk of stroke. The patient's score is based upon: CHF History: 0 HTN History: 1 Diabetes History: 0 Stroke History: 0 Vascular Disease History: 1 Age Score: 2 Gender Score: 0   Physical Exam:    VS:  BP (!) 164/88   Pulse 63   Ht 5\' 9"  (1.753 m)   Wt 212 lb 3.2 oz (96.3 kg)   SpO2 98%   BMI 31.34 kg/m     Wt Readings from Last 3 Encounters:  11/29/21 212 lb 3.2 oz (96.3 kg)  08/27/21 197 lb (89.4 kg)  07/24/21 199 lb 1.2 oz (90.3 kg)     GEN:  Well nourished, well developed in no acute distress HEENT: Normal NECK: No JVD; No carotid bruits LYMPHATICS: No lymphadenopathy CARDIAC: RRR, no murmurs, rubs, gallops RESPIRATORY:  Clear to auscultation without rales, wheezing or rhonchi  ABDOMEN: Soft, non-tender, non-distended MUSCULOSKELETAL:  No edema; No deformity  SKIN: Warm and dry NEUROLOGIC:  Alert and oriented x 3 PSYCHIATRIC:  Normal affect   ASSESSMENT AND PLAN:    Paroxysmal atrial fibrillation Sinus rhythm on exam today.  His episode could be isolated in setting of COVID-19 pneumonia earlier this summer.  However, patient did not felt palpitation or  anything at that time.  His [place] Vascor is 4.  I have reviewed anticoagulation risk and benefit in very detail.  Cost is a concern.  He has 2 weeks of supply.  Will work on consistent application as well as samples.  Recommended to choose medical plan which cover this medication for next year.  Reviewed warfarin in detail as well. -Continue carvedilol 6.25 mg twice daily  2.  Hypertension -Continue carvedilol at current dose.  Unable to titrate further due to heart rate in 60s. -Add amlodipine 5 mg daily. -He will keep a log  and let us know.  3. HLd - Continue statin    Medication Adjustments/Labs and Tests Ordered: Current medicines are reviewed at length with the patient today.  Concerns regarding medicines are outlined above.  No orders of the defined types were placed in this encounter.  Meds ordered this encounter  Medications   amLODipine (NORVASC) 5 MG tablet    Sig: Take 1 tablet (5 mg total) by mouth daily.    Dispense:  90 tablet    Refill:  1    Patient Instructions  Medication Instructions:  START Norvasc 5mg  Take 1 tablet once a day  *If you need a refill on your cardiac medications before your next appointment, please call your pharmacy*   Lab Work: None Ordered   Testing/Procedures: None Ordered   Follow-Up: At [facility], you and your health needs are our priority.  As part of our continuing mission to provide you with exceptional heart care, we have created designated Provider Care Teams.  These Care Teams include your primary Cardiologist (physician) and Advanced Practice Providers (APPs -  Physician Assistants and Nurse Practitioners) who all work together to provide you with the care you need, when you need it.  We recommend signing up for the patient portal called "MyChart".  Sign up information is provided on this After Visit Summary.  MyChart is used to connect with patients for Virtual Visits (Telemedicine).  Patients are able to view  lab/test results, encounter notes, upcoming appointments, etc.  Non-urgent messages can be sent to your provider as well.   To learn more about what you can do with MyChart, go to [email or web address].    Your next appointment:   6 month(s)  The format for your next appointment:   In Person  Provider:   Jenne Campus, MD    Your provider recommends that you schedule a follow-up appointment in: 1 month with PharmD (discuss BP & Eliquis) Other Instructions   Important Information About Sugar         Jeffery Soho, PA  11/29/2021 2:05 PM    [place]

## 2021-12-21 NOTE — Progress Notes (Unsigned)
NEUROLOGY FOLLOW UP OFFICE NOTE  Jeffery Barr [id number]  Assessment/Plan:   Transient ischemic attack involving the left cerebral hemisphere New onset atrial-fibrillation Hypertension Hyperlipidemia     1  Secondary stroke prevention as managed by PCP/cardiology:             - Eliquis             - Statin therapy.  LDL goal less than 70             - Normotensive blood pressure - follow up with PCP as it is elevated today             - Hgb A1c goal less than 7 2  Smoking cessation 3  Mediterranean diet 4  Routine exercise 5  Follow up 6 months.     Subjective:  Jeffery Barr is a 81 year old right-handed male with HTN, dyslipidemia, sinus bradycardia and CAD who follows up for TIA.  UPDATE: Current medications:  Eliquis, amlodipine, carvedilol, rosuvastatin, ezetimibe  He was admitted to the hospital on 6/3/203 for COVID-19 and found to have new-onset a fib.  He was started on Eliquis.    HISTORY: On 05/30/2021 patient developed acute onset of slurred speech, word-finding difficulty and right facial droop, upper extremity weakness and off-balance.  Most of symptoms resolved within 30 minutes.  Presented to [facility] where he only exhibited mild slurred speech with NIHSS of 1.  MRI of brain showed no acute infarct.  MRA of head showed no LVO or hemodynamically significant stenosis.  Carotid doppler revealed no hemodynamically significant stenosis.  TTE showed EF 60-65% with no cardiac source of embolus such as thrombus or IA shunt.  LDL was 11 and Hgb A1c was 5.2.  He was not on antithrombotic medication prior to admission and was discharged on ASA 81mg  and Plavix 75mg  DAPT for 3 weeks, followed by ASA 81mg  daily monotherapy.  His outpatient atorvastatin 80mg  was changed to rosuvastatin 20mg  daily.  PAST MEDICAL HISTORY: Past Medical History:  Diagnosis Date   Benign hypertensive heart disease without heart failure    Coronary atherosclerosis of native  coronary artery    S/P CABG   Dyslipidemia    HTN (hypertension)    Obesity    Sinus bradycardia    a. baseline HR 50s.    MEDICATIONS: Current Outpatient Medications on File Prior to Visit  Medication Sig Dispense Refill   amLODipine (NORVASC) 5 MG tablet Take 1 tablet (5 mg total) by mouth daily. 90 tablet 1   apixaban (ELIQUIS) 5 MG TABS tablet Take 1 tablet (5 mg total) by mouth 2 (two) times daily. 60 tablet 3   carvedilol (COREG) 3.125 MG tablet Take 1 tablet (3.125 mg total) by mouth 2 (two) times daily. (Patient taking differently: Take 6.25 mg by mouth 2 (two) times daily.) 60 tablet 0   ezetimibe (ZETIA) 10 MG tablet Take 1 tablet (10 mg total) by mouth daily. 30 tablet 0   rosuvastatin (CRESTOR) 20 MG tablet Take 1 tablet (20 mg total) by mouth daily. 30 tablet 0   No current facility-administered medications on file prior to visit.    ALLERGIES: No Known Allergies  FAMILY HISTORY: Family History  Problem Relation Age of Onset   Heart failure Mother    CAD Mother    Heart disease Brother    CAD Sister    CAD Other    Heart attack Other       Objective:  ***  General: No acute distress.  Patient appears well-groomed.   Head:  Normocephalic/atraumatic Eyes:  Fundi examined but not visualized Neck: supple, no paraspinal tenderness, full range of motion Heart:  Regular rate and rhythm Lungs:  Clear to auscultation bilaterally Back: No paraspinal tenderness Neurological Exam: alert and oriented to person, place, and time.  Speech fluent and not dysarthric, language intact.  CN II-XII intact. Bulk and tone normal, muscle strength 5/5 throughout.  Sensation to light touch intact.  Deep tendon reflexes 2+ throughout, toes downgoing.  Finger to nose testing intact.  Gait normal, Romberg negative.   Metta Clines, DO  CC: Orpah Melter, MD

## 2021-12-24 ENCOUNTER — Ambulatory Visit (INDEPENDENT_AMBULATORY_CARE_PROVIDER_SITE_OTHER): Payer: Medicare Other | Admitting: Neurology

## 2021-12-24 ENCOUNTER — Encounter: Payer: Self-pay | Admitting: Neurology

## 2021-12-24 VITALS — BP 155/79 | HR 54 | Ht 69.0 in | Wt 204.0 lb

## 2021-12-24 DIAGNOSIS — I480 Paroxysmal atrial fibrillation: Secondary | ICD-10-CM | POA: Diagnosis not present

## 2021-12-24 DIAGNOSIS — I2510 Atherosclerotic heart disease of native coronary artery without angina pectoris: Secondary | ICD-10-CM | POA: Diagnosis not present

## 2021-12-24 DIAGNOSIS — E785 Hyperlipidemia, unspecified: Secondary | ICD-10-CM

## 2021-12-24 DIAGNOSIS — I10 Essential (primary) hypertension: Secondary | ICD-10-CM

## 2021-12-24 DIAGNOSIS — G459 Transient cerebral ischemic attack, unspecified: Secondary | ICD-10-CM

## 2021-12-24 NOTE — Patient Instructions (Signed)
Continue aspririn 81mg  daily, rosuvastatin, blood pressure medications Follow up in 6 months.

## 2022-01-09 ENCOUNTER — Ambulatory Visit: Payer: Medicare Other | Attending: Interventional Cardiology | Admitting: Pharmacist

## 2022-01-09 VITALS — BP 164/72

## 2022-01-09 DIAGNOSIS — I10 Essential (primary) hypertension: Secondary | ICD-10-CM | POA: Insufficient documentation

## 2022-01-09 DIAGNOSIS — I480 Paroxysmal atrial fibrillation: Secondary | ICD-10-CM | POA: Insufficient documentation

## 2022-01-09 DIAGNOSIS — I2510 Atherosclerotic heart disease of native coronary artery without angina pectoris: Secondary | ICD-10-CM

## 2022-01-09 MED ORDER — AMLODIPINE BESYLATE 10 MG PO TABS: 10.0000 mg | ORAL_TABLET | Freq: Every day | ORAL | 3 refills | Status: DC

## 2022-01-09 MED ORDER — CARVEDILOL 6.25 MG PO TABS: 6.2500 mg | ORAL_TABLET | Freq: Two times a day (BID) | ORAL | 3 refills | Status: DC

## 2022-01-09 NOTE — Patient Instructions (Addendum)
-  Increase amlodipine 5 mg to amlodipine 10 mg daily (can double up on 5 mg tablets)- will see full affect within 2 weeks -Carvedilol 3.125 mg (2 tablets) two times daily changed to carvedilol 6.25 mg two times daily -Start checking blood pressure at home; feet flat on floor, bladder emptied, back supported, wait five minutes prior to reading and if high wait few minutes and take it again -Follow up within 2-3 weeks with home blood pressure readings with Melissa -Can MyChart or call [phone] for Malena Peer -Follow up in person appointment on 02/21/2021 @1 :30PM

## 2022-01-09 NOTE — Assessment & Plan Note (Addendum)
Assessment: Patient discussed with neurologist about discontinuing his Eliquis 5 mg BID States no longer in atrial fibrillation and was ok'd to only continue aspirin 81 mg CHADS-VASc score is 4 (HTN, vascular disease, age) which is 4.8% annual stroke risk Could not afford the medication due to price of $400/month and wife talked with insurance company that they would be paying the same price Patient and wife do not want to change their plan to a new insurance plan to better cover eliquis  Did not want to discuss other options like warfarin  Plan: Patient has increased risk of returning into atrial fibrillation and increase risk of a clot forming  Discussed aspirin will not protect or decrease risk of a clot forming in atrial fibrillation Discussed price of medication is most likely deductible especially for a name brand medication such as Eliquis and that price would most likely be lower after this or looking for new plan that covers Eliquis for next year. Stressed importance of having anticoagulation for protection against stroke and looking at different options such as warfarin Let patient know its his decision after providing more information and weighing the risks and benefits Patient is okay with risk at this time

## 2022-01-09 NOTE — Assessment & Plan Note (Addendum)
Assessment: Patient blood pressure elevated and above goal in office: 164/72 HR 53 Current regimen is amlodipine 5 mg daily and carvedilol 3.125 mg: two tablets two times daily Does not check blood pressure at home and only checked [facility] he picks up his medications and states always running high Does not like taking 4 tablets of the carvedilol 3.125 mg throughout the day Has history of white coat hypertension Previously on HCTZ 25 mg but discontinued after TIA for permissive hypertension Feared would not have refills for medications with many doctors writing prescriptions and confusing to who they could reach out to  Plan: Increase amlodipine from 5 mg to 10 mg daily Switch carvedilol 3.125 mg tablet to 6.25 mg tablet to ease pill burden Start checking blood pressure at home daily and to use home cuff Can find a good home cuff at [email or web address] and to use proper technique: feet flat on floor, bladder emptied, back supported, wait five minutes prior to reading and if high wait few minutes and take it again Follow up within 2-3 weeks by phone after increasing the amlodipine to 10 mg. Call [phone] Follow up in person appointment on 02/21/2022: to re-assess blood pressure and possible addition of HCTZ 25 mg Discussed can reach out to office for refills and that their cardiologist will continue to write his heart medication prescriptions and his PCP can write his other meds

## 2022-01-09 NOTE — Progress Notes (Addendum)
Patient ID: Jeffery Barr                 DOB: [birth year]                      MRN: [id number]      HPI: Jeffery Barr is a 81 y.o. male referred by Dr. Bing Barr to HTN clinic. PMH is significant for CAD s/p CABG, dyslipidemia, essential hypertension, obesity, atrial fibrillation (CHADS-VASc 4) (previosuly on Eliquis 5 mg BID), and history of TIA. Patient had some concerns about the cost of anticoagulation at last visit and is spending $400/month and whether it is needed. Patient and Jeffery Barr discussed choosing a different medical plan to cover this medication for next year and reviewed warfarin with the patient.   Patient has an elevated blood pressure that is above goal. At last visit, his blood pressure was 155/79 and HR 54. Currently takes amlodipine 5 mg daily (added in 10/23) and carvedilol 3.125 mg (2 tablets) two times daily and has previous history of taking amlodipine 5 mg back in 2021, along with HCTZ 25 mg. He currently takes rosuvastatin 20 mg daily and ezetimibe 10 mg daily for his hyperlipidemia, which was changed from his atorvastatin 80 mg in 05/2021.  When patient was hospitalized in 05/2021 for TIA, he stated he stopped taking all his medications for 2 years because was taking the same medications for 18 years and did not think he needed them anymore per his discharge note.  Today, patient is accompanied by wife to discuss his blood pressure and Eliquis for atrial fibrillation. Patient states he is no longer taking Eliquis since he is no longer in atrial fibrillation at this time. Per patient, his neurologist told him he didn't need it. He wants to continue his aspirin per his conversation with the neurologist. Patient and wife have discussed with the insurance that they will be paying $473/month and will not be switching their insurance plan for the new year. It was discussed with the patient at length the risks of not being an anticoagulation and he will not know when he is in and out  of atrial fibrillation. Discussed that he has risk factors that can increase his chance of clot formation along with the atrial fibrillation and aspirin alone would not protect from a stroke due to afib, but patient was addiment on not starting any anticoagulation whether with Eliquis or warfarin. Patient is content on no anticoagulation at this time knowing the risks provided by the PharmDs today.  Patient's blood pressure regimen consists of amlodipine 5 mg daily and carvedilol 3.125 mg. He was taking 2 tablets (6.25 mg) two times daily and wish to decrease his pill burden. Patient did not have a home cuff and would check his blood pressure [facility]. Patient did not have specific readings at this time, but states his blood pressure has always ran high and its normal for him. Has history of white coat hypertension and history of using HCTZ 25 mg which was stopped after his TIA/stroke to keep blood pressure from dropping below goal at that time.  Patient and wife were frustrated they had to [facility] their [facility] times [facility] and not all at once and they had many doctors writing prescriptions for his medications. It was discussed medication fills are based on the insurance and when they would be willing to cover his medications or they could pay  cash price/use GoodRx to pay and have them all at same time. Told them that Dr. Bing Barr would be able to write their hearts medications and their PCP can write their other medications. They can reach out to this office as well if need refills for their medications and they were re-assured they will not be without refills.   Current HTN meds: amlodipine 5 mg daily, carvedilol 3.125 mg: two tablets two times daily Previously tried: metoprolol succinate 50 mg, HCTZ 25 mg (stopped after TIA for permissive hypertension) BP goal: <130/80  Current Lipid meds: rosuvastatin 20 mg daily and ezetimibe 10 mg  daily Intolerances: none Risk Factors: CAD, HTN, hx of TIA (4/23) LDL-C goal: <70  Family History:  Family History  Problem Relation Age of Onset   Heart failure Mother    CAD Mother    Heart disease Brother    CAD Sister    CAD Other    Heart attack Other    Social History:  reports that he has quit smoking. He has never used smokeless tobacco. He reports current alcohol use of about 3.0 - 5.0 standard drinks of alcohol per week. He reports that he does not use drugs.   Diet: did not discuss  Exercise: did not discuss   Home BP readings: [facility]. Does not write readings down, states it always runs high.  Office BP readings:  Manual: 164/72, 164/70 HR 53  Last Lipid Panel:  Wt Readings from Last 3 Encounters:  12/24/21 204 lb (92.5 kg)  11/29/21 212 lb 3.2 oz (96.3 kg)  08/27/21 197 lb (89.4 kg)   BP Readings from Last 3 Encounters:  01/09/22 (!) 164/72  12/24/21 (!) 155/79  11/29/21 (!) 160/100   Pulse Readings from Last 3 Encounters:  12/24/21 (!) 54  11/29/21 63  08/27/21 (!) 57    Renal function: CrCl cannot be calculated (Patient's most recent lab result is older than the maximum 21 days allowed.).  Past Medical History:  Diagnosis Date   Benign hypertensive heart disease without heart failure    Coronary atherosclerosis of native coronary artery    S/P CABG   Dyslipidemia    HTN (hypertension)    Obesity    Sinus bradycardia    a. baseline HR 50s.    Current Outpatient Medications on File Prior to Visit  Medication Sig Dispense Refill   apixaban (ELIQUIS) 5 MG TABS tablet Take 1 tablet (5 mg total) by mouth 2 (two) times daily. 60 tablet 3   ezetimibe (ZETIA) 10 MG tablet Take 1 tablet (10 mg total) by mouth daily. 30 tablet 0   rosuvastatin (CRESTOR) 20 MG tablet Take 1 tablet (20 mg total) by mouth daily. 30 tablet 0   No current facility-administered medications on file prior to visit.   Blood pressure  (!) 164/72.  Assessment/Plan: HTN (hypertension) Assessment: Patient blood pressure elevated and above goal in office: 164/72 HR 53 Current regimen is amlodipine 5 mg daily and carvedilol 3.125 mg: two tablets two times daily Does not check blood pressure at home and [facility] and states always running high Does not like taking 4 tablets of the carvedilol 3.125 mg throughout the day Has history of white coat hypertension Previously on HCTZ 25 mg but discontinued after TIA for permissive hypertension Feared would not have refills for medications with many doctors writing prescriptions and confusing to who they could reach out  to  Plan: Increase amlodipine from 5 mg to 10 mg daily Switch carvedilol 3.125 mg tablet to 6.25 mg tablet to ease pill burden Start checking blood pressure at home daily and to use home cuff Can find a good home cuff at [email or web address] and to use proper technique: feet flat on floor, bladder emptied, back supported, wait five minutes prior to reading and if high wait few minutes and take it again Follow up within 2-3 weeks by phone after increasing the amlodipine to 10 mg. Call [phone] Follow up in person appointment on 02/21/2022: to re-assess blood pressure and possible addition of HCTZ 25 mg Discussed can reach out to office for refills and that their cardiologist will continue to write his heart medication prescriptions and his PCP can write his other meds  Paroxysmal atrial fibrillation [facility]) Assessment: Patient discussed with neurologist about discontinuing his Eliquis 5 mg BID States no longer in atrial fibrillation and was ok'd to only continue aspirin 81 mg CHADS-VASc score is 4 (HTN, vascular disease, age) which is 4.8% annual stroke risk Could not afford the medication due to price of $400/month and wife talked with insurance company that they would be paying the same price Patient and wife do not want to change  their plan to a new insurance plan to better cover eliquis  Did not want to discuss other options like warfarin  Plan: Patient has increased risk of returning into atrial fibrillation and increase risk of a clot forming  Discussed aspirin will not protect or decrease risk of a clot forming in atrial fibrillation Discussed price of medication is most likely deductible especially for a name brand medication such as Eliquis and that price would most likely be lower after this or looking for new plan that covers Eliquis for next year. Stressed importance of having anticoagulation for protection against stroke and looking at different options such as warfarin Let patient know its his decision after providing more information and weighing the risks and benefits Patient is okay with risk at this time   Thank you,  Arabella Merles, PharmD. [facility] Acute Care PGY-1 01/09/2022 1:25 PM   Olene Floss, Pharm.D, BCPS, CPP [facility] Division of [facility] [place] [address] [place], [place] [address]  Phone: [phone]; Fax: [phone]

## 2022-02-21 ENCOUNTER — Ambulatory Visit: Payer: Medicare Other | Attending: Internal Medicine | Admitting: Pharmacist

## 2022-02-21 VITALS — BP 140/62 | HR 56

## 2022-02-21 DIAGNOSIS — I10 Essential (primary) hypertension: Secondary | ICD-10-CM | POA: Diagnosis not present

## 2022-02-21 DIAGNOSIS — E785 Hyperlipidemia, unspecified: Secondary | ICD-10-CM | POA: Insufficient documentation

## 2022-02-21 MED ORDER — EZETIMIBE 10 MG PO TABS: 10.0000 mg | ORAL_TABLET | Freq: Every day | ORAL | 3 refills | Status: DC

## 2022-02-21 MED ORDER — EZETIMIBE 10 MG PO TABS: 10.0000 mg | ORAL_TABLET | Freq: Every day | ORAL | 0 refills | Status: DC

## 2022-02-21 NOTE — Patient Instructions (Signed)
Summary of today's discussion  1.Continue amlodipine 10 mg daily, carvedilol 6.25 mg two times daily  2. Please increase your physical activity. Aim to work up to 150 min of moderate exercise  3.  4.  5.   Your blood pressure goal is <130/80  To check your pressure at home you will need to:  1. Sit up in a chair, with feet flat on the floor and back supported. Do not cross your ankles or legs. 2. Rest your left arm so that the cuff is about heart level. If the cuff goes on your upper arm,  then just relax the arm on the table, arm of the chair or your lap. If you have a wrist cuff, we  suggest relaxing your wrist against your chest (think of it as Pledging the Flag with the  wrong arm).  3. Place the cuff snugly around your arm, about 1 inch above the crook of your elbow. The  cords should be inside the groove of your elbow.  4. Sit quietly, with the cuff in place, for about 5 minutes. After that 5 minutes press the power  button to start a reading. 5. Do not talk or move while the reading is taking place.  6. Record your readings on a sheet of paper. Although most cuffs have a memory, it is often  easier to see a pattern developing when the numbers are all in front of you.  7. You can repeat the reading after 1-3 minutes if it is recommended  Make sure your bladder is empty and you have not had caffeine or tobacco within the last 30 min  Always bring your blood pressure log with you to your appointments. If you have not brought your monitor in to be double checked for accuracy, please bring it to your next appointment.  You can find a list of validated (accurate) blood pressure cuffs at [employer]   Important lifestyle changes to control high blood pressure  Intervention  Effect on the BP  Lose extra pounds and watch your waistline Weight loss is one of the most effective lifestyle changes for controlling blood pressure. If you're overweight or obese, losing even a small  amount of weight can help reduce blood pressure. Blood pressure might go down by about 1 millimeter of mercury (mm Hg) with each kilogram (about 2.2 pounds) of weight lost.  Exercise regularly As a general goal, aim for at least 30 minutes of moderate physical activity every day. Regular physical activity can lower high blood pressure by about 5 to 8 mm Hg.  Eat a healthy diet Eating a diet rich in whole grains, fruits, vegetables, and low-fat dairy products and low in saturated fat and cholesterol. A healthy diet can lower high blood pressure by up to 11 mm Hg.  Reduce salt (sodium) in your diet Even a small reduction of sodium in the diet can improve heart health and reduce high blood pressure by about 5 to 6 mm Hg.  Limit alcohol One drink equals 12 ounces of beer, 5 ounces of wine, or 1.5 ounces of 80-proof liquor.  Limiting alcohol to less than one drink a day for women or two drinks a day for men can help lower blood pressure by about 4 mm Hg.   Please call me at [phone] with any questions.

## 2022-02-21 NOTE — Progress Notes (Signed)
Patient ID: [name]                 DOB: Sep 27, 1940                      MRN: [id number]      HPI: Jeffery Barr is a 82 y.o. male referred by Dr. Agustin Barr to HTN clinic. PMH is significant for CAD s/p CABG, dyslipidemia, essential hypertension, obesity, atrial fibrillation (CHADS-VASc 4) (previosuly on Eliquis 5 mg BID), and history of TIA. Patient had some concerns about the cost of anticoagulation at last visit and is spending $400/month and whether it is needed. Patient and [name] discussed choosing a different medical plan to cover this medication for next year and reviewed warfarin with the patient.   Patient previously seen in Hypertension clinic on 01/09/22.  He was adamant that he did not want to take his Eliquis, risk and benefits were reviewed with him and patient does not wish to take any anticoagulation.  His blood pressure was elevated in clinic at 164/72.  He did not check his blood pressure at home, [facility].  Reported that his blood pressure always runs high.  After long discussion his amlodipine was increased to 10 mg daily and he was given a new prescription for carvedilol 6.25mg  twice a day to decrease his pill burden as he was taking 2 of the 3.125 mg tablets twice a day.   Patient presents today accompanied by his wife.  They did buy an upper arm cuff and have been checking blood pressure at home. Blood pressures 657Q to 469G systolic, mainly 295M to 841L.  Patient denies any dizziness, lightheadedness, shortness of breath or swelling.  Patient has not been taking ezetimibe as he only had a 30-day prescription with no refills post hospital.  I have sent in a 30-day supply to get on track for their 90-day supply of other meds.  Also gave her a written prescription for 90 days.   Current HTN meds: amlodipine 10 mg daily, carvedilol 6.25 mg two times daily Previously tried: metoprolol succinate 50 mg, HCTZ 25 mg (stopped after TIA for  permissive hypertension) BP goal: <130/80  Current Lipid meds: rosuvastatin 20 mg daily and ezetimibe 10 mg daily Intolerances: none Risk Factors: CAD, HTN, hx of TIA (4/23) LDL-C goal: <70  Family History:  Family History  Problem Relation Age of Onset   Heart failure Mother    CAD Mother    Heart disease Brother    CAD Sister    CAD Other    Heart attack Other    Social History:  reports that he has quit smoking. He has never used smokeless tobacco. He reports current alcohol use of about 3.0 - 5.0 standard drinks of alcohol per week. He reports that he does not use drugs.   Diet: 1 cup of coffee in the morning, 1 or 2 0 sugar sodas per week, wife watches his salt when she grocery shops, eats low-sodium daily meat, glass of wine with dinner occasionally.  Exercise: walks 15-20 min walk but reports quarter of a mile, house work   Home BP readings:   systolic dyastolic   244 64   010 60   147 80   145 64   135 60   133 54   138 73   134 69   135 53  Average 139.7778 64.11111    Last Lipid Panel:  Wt Readings from Last 3 Encounters:  12/24/21 204 lb (92.5 kg)  11/29/21 212 lb 3.2 oz (96.3 kg)  08/27/21 197 lb (89.4 kg)   BP Readings from Last 3 Encounters:  02/21/22 (!) 140/62  01/09/22 (!) 164/72  12/24/21 (!) 155/79   Pulse Readings from Last 3 Encounters:  02/21/22 (!) 56  12/24/21 (!) 54  11/29/21 63    Renal function: CrCl cannot be calculated (Patient's most recent lab result is older than the maximum 21 days allowed.).  Past Medical History:  Diagnosis Date   Benign hypertensive heart disease without heart failure    Coronary atherosclerosis of native coronary artery    S/P CABG   Dyslipidemia    HTN (hypertension)    Obesity    Sinus bradycardia    a. baseline HR 50s.    Current Outpatient Medications on File Prior to Visit  Medication Sig Dispense Refill   amLODipine (NORVASC) 10 MG tablet Take 1 tablet (10 mg total) by mouth daily.  Replacing 5 mg tablet. Dose increase 180 tablet 3   aspirin EC 81 MG tablet Take 81 mg by mouth daily. Swallow whole.     carvedilol (COREG) 6.25 MG tablet Take 1 tablet (6.25 mg total) by mouth 2 (two) times daily. Replacing the 3.125 mg 180 tablet 3   rosuvastatin (CRESTOR) 20 MG tablet Take 1 tablet (20 mg total) by mouth daily. 30 tablet 0   No current facility-administered medications on file prior to visit.   Blood pressure (!) 140/62, pulse (!) 56, SpO2 98 %.  Assessment/Plan: HTN (hypertension) Assessment: Blood pressure in clinic above goal of less than 130/80, patient seems to be functional and I do not necessarily think his goal needs to be up to less than 140.   He states that they walk about a quarter of a mile a when asked how long it takes he says about 15 to 20 minutes.  Does not sound like he is walking at a moderate pace Does not appear to be consuming too much caffeine Home blood pressures in the 161W to 960A systolic over 54U diastolic.  Average is 139/64 Patient very resistant to more medications  Plan: I have discussed the benefits of nonpharm intervention patient.  Encouraged him to work up to 150 minutes of moderate aerobic exercise Will not make any medication changes and will implement physical activity Follow-up in about 2 months.  If blood pressure is still elevated at that time will need to consider adding back HCTZ Continue checking blood pressure at home  Dyslipidemia Assessment:  Patient supposed to be on rosuvastatin 20 mg and ezetimibe 10 mg however he is only taking rosuvastatin Appears ezetimibe prescription was written at discharge for only 30 days with no refills Patient tolerating rosuvastatin well He asked and I reviewed side effects of both rosuvastatin and ezetimibe  Plan: Resume ezetimibe 10 mg daily along with his rosuvastatin 20 mg daily They like to have all of their medications refilled at 1 time therefore I sent a 30-day prescription  for ezetimibe to the pharmacy to get them on track with her other medicines.  And then gave them a written prescription for 90-day supply Will need to schedule lipid panel at next visit    Thank you,  Jeffery Barr, Pharm.D, BCPS, CPP [facility] of [facility] [address] [place], [place] [address]  Phone: [phone]; Fax: [phone]

## 2022-02-21 NOTE — Assessment & Plan Note (Signed)
Assessment: Blood pressure in clinic above goal of less than 130/80, patient seems to be functional and I do not necessarily think his goal needs to be up to less than 140.   He states that they walk about a quarter of a mile a when asked how long it takes he says about 15 to 20 minutes.  Does not sound like he is walking at a moderate pace Does not appear to be consuming too much caffeine Home blood pressures in the 242A to 834H systolic over 96Q diastolic.  Average is 139/64 Patient very resistant to more medications  Plan: I have discussed the benefits of nonpharm intervention patient.  Encouraged him to work up to 150 minutes of moderate aerobic exercise Will not make any medication changes and will implement physical activity Follow-up in about 2 months.  If blood pressure is still elevated at that time will need to consider adding back HCTZ Continue checking blood pressure at home

## 2022-02-21 NOTE — Assessment & Plan Note (Signed)
Assessment:  Patient supposed to be on rosuvastatin 20 mg and ezetimibe 10 mg however he is only taking rosuvastatin Appears ezetimibe prescription was written at discharge for only 30 days with no refills Patient tolerating rosuvastatin well He asked and I reviewed side effects of both rosuvastatin and ezetimibe  Plan: Resume ezetimibe 10 mg daily along with his rosuvastatin 20 mg daily They like to have all of their medications refilled at 1 time therefore I sent a 30-day prescription for ezetimibe to the pharmacy to get them on track with her other medicines.  And then gave them a written prescription for 90-day supply Will need to schedule lipid panel at next visit

## 2022-03-15 ENCOUNTER — Other Ambulatory Visit: Payer: Self-pay | Admitting: Cardiology

## 2022-04-16 ENCOUNTER — Ambulatory Visit: Payer: Medicare Other | Attending: Cardiovascular Disease | Admitting: Pharmacist

## 2022-04-16 VITALS — BP 140/60 | HR 55

## 2022-04-16 DIAGNOSIS — I10 Essential (primary) hypertension: Secondary | ICD-10-CM | POA: Diagnosis not present

## 2022-04-16 MED ORDER — CARVEDILOL 6.25 MG PO TABS: 6.2500 mg | ORAL_TABLET | Freq: Two times a day (BID) | ORAL | 3 refills | Status: DC

## 2022-04-16 MED ORDER — EZETIMIBE 10 MG PO TABS: 10.0000 mg | ORAL_TABLET | Freq: Every day | ORAL | 3 refills | Status: DC

## 2022-04-16 MED ORDER — ROSUVASTATIN CALCIUM 20 MG PO TABS: 20.0000 mg | ORAL_TABLET | Freq: Every day | ORAL | 3 refills | Status: DC

## 2022-04-16 MED ORDER — CHLORTHALIDONE 25 MG PO TABS: 12.5000 mg | ORAL_TABLET | Freq: Every day | ORAL | 3 refills | Status: DC

## 2022-04-16 MED ORDER — AMLODIPINE BESYLATE 5 MG PO TABS: 5.0000 mg | ORAL_TABLET | Freq: Every day | ORAL | 3 refills | Status: DC

## 2022-04-16 NOTE — Assessment & Plan Note (Addendum)
Assessment:  Blood pressure above goal at home and in clinic He did have one reading of 128/60 in clinic and then a few min later was 140/60 Not much physical activity Swelling in legs correlates with amlodipine increase  Plan: Decrease amlodipine to '5mg'$  daily Add chlorthalidone 12.'5mg'$  daily Continue carvedilol 6.'25mg'$  twice a day Follow up in 1 month- will need BMP Bring home BP cuff to check for accuracy Patient likes to have all medications on the same refill schedule. Meds now all on the same schedule. 90 DS of all sent to pharmacy.

## 2022-04-16 NOTE — Progress Notes (Signed)
Patient ID: Jeffery Barr                 DOB: 11-13-1940                      MRN: [id number]      HPI: Jeffery Barr is a 82 y.o. male referred by Dr. Agustin Cree to HTN clinic. PMH is significant for CAD s/p CABG, dyslipidemia, essential hypertension, obesity, atrial fibrillation (CHADS-VASc 4) (previosuly on Eliquis 5 mg BID), and history of TIA. Patient had some concerns about the cost of anticoagulation at last visit and is spending $400/month and whether it is needed. Patient and Jeffery Barr discussed choosing a different medical plan to cover this medication for next year and reviewed warfarin with the patient.   Patient previously seen in Hypertension clinic on 01/09/22.  He was adamant that he did not want to take his Eliquis, risk and benefits were reviewed with him and patient does not wish to take any anticoagulation.  His blood pressure was elevated in clinic at 164/72.  He did not check his blood pressure at home, only at [facility] when [facility].  Reported that his blood pressure always runs high.  After long discussion his amlodipine was increased to 10 mg daily and he was given a new prescription for carvedilol 6.'25mg'$  twice a day to decrease his pill burden as he was taking 2 of the 3.125 mg tablets twice a day.   At last visit, home blood pressures were averages 139/64. Patient very resistant to additional medications. He was encouraged to increase physical activity. He was asked to resume ezetimibe as he had stopped this when he ran out.   Patient presents today accompanied by his wife. Patient denies dizziness, lightheadedness, headache, blurred vision or SOB. He does however have swelling in his legs after increasing amlodipine to '10mg'$  daily. He states is does not hurt. Sometimes itches. However on exam, swelling is somewhat significant. He has not increased his exercise as discussed. Still walking some with his wife.  Home BP mainly 140's/60's. HR 55-66.    Current HTN  meds: amlodipine 10 mg daily, carvedilol 6.25 mg two times daily Previously tried: metoprolol succinate 50 mg, HCTZ 25 mg (stopped after TIA for permissive hypertension) BP goal: <130/80  Current Lipid meds: rosuvastatin 20 mg daily and ezetimibe 10 mg daily Intolerances: none Risk Factors: CAD, HTN, hx of TIA (4/23) LDL-C goal: <70  Family History:  Family History  Problem Relation Age of Onset   Heart failure Mother    CAD Mother    Heart disease Brother    CAD Sister    CAD Other    Heart attack Other    Social History:  reports that he has quit smoking. He has never used smokeless tobacco. He reports current alcohol use of about 3.0 - 5.0 standard drinks of alcohol per week. He reports that he does not use drugs.   Diet: 1 cup of coffee in the morning, 1 or 2 0 sugar sodas per week, wife watches his salt when she grocery shops, eats low-sodium daily meat, glass of wine with dinner occasionally.  Exercise: walks 15-20 min walk but reports quarter of a mile, house work   Home BP readings:   systolic diastolic  HR   [id number] 62  56   140 67  64   148 65  59   143 68  60   146 68  56  138 66  66   140 76  63   143 67  54   137 70  54  Average 142.4444 [id number]  59.11111    Last Lipid Panel:  Wt Readings from Last 3 Encounters:  12/24/21 204 lb (92.5 kg)  11/29/21 212 lb 3.2 oz (96.3 kg)  08/27/21 197 lb (89.4 kg)   BP Readings from Last 3 Encounters:  04/16/22 (!) 140/60  02/21/22 (!) 140/62  01/09/22 (!) 164/72   Pulse Readings from Last 3 Encounters:  04/16/22 (!) 55  02/21/22 (!) 56  12/24/21 (!) 54    Renal function: CrCl cannot be calculated (Patient's most recent lab result is older than the maximum 21 days allowed.).  Past Medical History:  Diagnosis Date   Benign hypertensive heart disease without heart failure    Coronary atherosclerosis of native coronary artery    S/P CABG   Dyslipidemia    HTN (hypertension)    Obesity    Sinus bradycardia     a. baseline HR 50s.    Current Outpatient Medications on File Prior to Visit  Medication Sig Dispense Refill   aspirin EC 81 MG tablet Take 81 mg by mouth daily. Swallow whole.     No current facility-administered medications on file prior to visit.   Blood pressure (!) 140/60, pulse (!) 55.  Assessment/Plan: HTN (hypertension) Assessment:  Blood pressure above goal at home and in clinic He did have one reading of 128/60 in clinic and then a few min later was 140/60 Not much physical activity Swelling in legs correlates with amlodipine increase  Plan: Decrease amlodipine to '5mg'$  daily Add chlorthalidone 12.'5mg'$  daily Continue carvedilol 6.'25mg'$  twice a day Follow up in 1 month- will need BMP Bring home BP cuff to check for accuracy Patient likes to have all medications on the same refill schedule. Meds now all on the same schedule. 90 DS of all sent to pharmacy.  Thank you,  Ramond Dial, Pharm.D, BCPS, CPP [facility] of [facility] [address] [place], [place] [address]  Phone: [phone]; Fax: [phone]

## 2022-04-16 NOTE — Patient Instructions (Addendum)
Please decrease amlodipine to '5mg'$  daily START chlorthalidone 12.'5mg'$  (1/2 tablet)  Continue carvedilol 6.25 twice a day, rosuvastatin '20mg'$  daily and ezetimibe '10mg'$  daily  Please continue to check blood pressure at home. Make sure you are resting 25mn  Please bring you blood pressure cuff with you to your next appointment  Call me at [phone] any questions

## 2022-05-23 ENCOUNTER — Ambulatory Visit: Payer: Medicare Other | Attending: Cardiology | Admitting: Pharmacist

## 2022-05-23 VITALS — BP 140/60 | HR 57

## 2022-05-23 DIAGNOSIS — I10 Essential (primary) hypertension: Secondary | ICD-10-CM | POA: Diagnosis not present

## 2022-05-23 NOTE — Progress Notes (Signed)
Patient ID: Jeffery Barr                 DOB: [birth year]                      MRN: [id number]      HPI: Jeffery Barr is a 82 y.o. male referred by Dr. Agustin Cree to HTN clinic. PMH is significant for CAD s/p CABG, dyslipidemia, essential hypertension, obesity, atrial fibrillation (CHADS-VASc 4) (previosuly on Eliquis 5 mg BID), and history of TIA. Patient had some concerns about the cost of anticoagulation at last visit and is spending $400/month and whether it is needed. Patient and Jeffery Barr discussed choosing a different medical plan to cover this medication for next year and reviewed warfarin with the patient.   Patient previously seen in Hypertension clinic on 01/09/22.  He was adamant that he did not want to take his Eliquis, risk and benefits were reviewed with him and patient does not wish to take any anticoagulation.  His blood pressure was elevated in clinic at 164/72.  He did not check his blood pressure at home, [facility].  Reported that his blood pressure always runs high.  After long discussion his amlodipine was increased to 10 mg daily and he was given a new prescription for carvedilol 6.25mg  twice a day to decrease his pill burden as he was taking 2 of the 3.125 mg tablets twice a day.   At last visit, home blood pressures were averages 139/64. Patient very resistant to additional medications. He was encouraged to increase physical activity. He was asked to resume ezetimibe as he had stopped this when he ran out.   At last visit with PharmD 2/27 Patient had swelling in his legs after increasing amlodipine to 10mg  daily. On exam, swelling was somewhat significant. He had not increased his exercise as discussed.  Home BP mainly 140's/60's. HR 55-66. Amlodipine was decreased to 5mg  daily and chlorthalidone 12.5mg  daily was added.   Patient presents today accompanied by his wife. Denies dizziness, lightheadedness, headache, blurred vision, SOB, swelling.  Swelling improved on lower dose of amlodipine. BP about the same. Average 142/64. Brings in home BP cuff ([facility] brand). Placement a little off and loose. Hope BP cuff gave erratic readings while I got consistently 140/60  152/71 home 140/60 clinic 144/71 home 166/83 home  Current HTN meds: amlodipine 5 mg daily, carvedilol 6.25 mg two times daily, chlorthalidone 12.5mg  daily Previously tried: metoprolol succinate 50 mg, HCTZ 25 mg (stopped after TIA for permissive hypertension) BP goal: <130/80  Current Lipid meds: rosuvastatin 20 mg daily and ezetimibe 10 mg daily Intolerances: none Risk Factors: CAD, HTN, hx of TIA (4/23) LDL-C goal: <70  Family History:  Family History  Problem Relation Age of Onset   Heart failure Mother    CAD Mother    Heart disease Brother    CAD Sister    CAD Other    Heart attack Other    Social History:  reports that he has quit smoking. He has never used smokeless tobacco. He reports current alcohol use of about 3.0 - 5.0 standard drinks of alcohol per week. He reports that he does not use drugs.   Diet: 1 cup of coffee in the morning, 1 or 2 0 sugar sodas per week, wife watches his salt when she grocery shops, eats low-sodium daily meat, glass of wine with dinner occasionally.  Exercise: walks 15-20 min walk  but reports quarter of a mile, house work   Home BP readings:  146 52  145 62  137 64  143 68  142 79  140 60  152 66  145 57  136 70  149 62  133 70  144 63  143  61  142.6923 [id number]     Last Lipid Panel:  Wt Readings from Last 3 Encounters:  12/24/21 204 lb (92.5 kg)  11/29/21 212 lb 3.2 oz (96.3 kg)  08/27/21 197 lb (89.4 kg)   BP Readings from Last 3 Encounters:  05/23/22 (!) 140/60  04/16/22 (!) 140/60  02/21/22 (!) 140/62   Pulse Readings from Last 3 Encounters:  05/23/22 (!) 57  04/16/22 (!) 55  02/21/22 (!) 56    Renal function: CrCl cannot be calculated (Patient's most recent lab result is older than the  maximum 21 days allowed.).  Past Medical History:  Diagnosis Date   Benign hypertensive heart disease without heart failure    Coronary atherosclerosis of native coronary artery    S/P CABG   Dyslipidemia    HTN (hypertension)    Obesity    Sinus bradycardia    a. baseline HR 50s.    Current Outpatient Medications on File Prior to Visit  Medication Sig Dispense Refill   amLODipine (NORVASC) 5 MG tablet Take 1 tablet (5 mg total) by mouth daily. 180 tablet 3   aspirin EC 81 MG tablet Take 81 mg by mouth daily. Swallow whole.     carvedilol (COREG) 6.25 MG tablet Take 1 tablet (6.25 mg total) by mouth 2 (two) times daily. Replacing the 3.125 mg 180 tablet 3   chlorthalidone (HYGROTON) 25 MG tablet Take 0.5 tablets (12.5 mg total) by mouth daily. 45 tablet 3   ezetimibe (ZETIA) 10 MG tablet Take 1 tablet (10 mg total) by mouth daily. 90 tablet 3   rosuvastatin (CRESTOR) 20 MG tablet Take 1 tablet (20 mg total) by mouth daily. 90 tablet 3   No current facility-administered medications on file prior to visit.   Blood pressure (!) 140/60, pulse (!) 57.  Assessment/Plan: HTN (hypertension) Assessment: Blood pressure above goal of <130/80 in clinic Home BP cuff does not appear accurate Asked pt to pick up a new BP cuff- gave recommendations on brand. Can get OMRON at [facility] for $35 Need to check BMP today since starting chlorthalidone  Plan: Will check BMP today. If everything looks good, will plan to increase chlorthalidone to 25mg  daily Continue amlodipine 5 mg daily, carvedilol 6.25 mg two times daily Schedule follow up tomorrow based on labs   Thank you,  Ramond Dial, Pharm.D, BCPS, CPP [facility] [address], [place], [place] [address]  Phone: [phone]; Fax: [phone]

## 2022-05-23 NOTE — Patient Instructions (Signed)
Summary of today's discussion  For now continue amlodipine 5 mg daily, carvedilol 6.25 mg two times daily, chlorthalidone 12.5mg  daily  2. I will call you tomorrow with your lab results  3.consider buying a new blood pressure cuff OMRON 3 series, bronze series or [employer]  4.continue checking blood pressure daily  5.increase exercise- try to walk at a faster pace   Your blood pressure goal is <130/80  To check your pressure at home you will need to:  1. Sit up in a chair, with feet flat on the floor and back supported. Do not cross your ankles or legs. 2. Rest your left arm so that the cuff is about heart level. If the cuff goes on your upper arm,  then just relax the arm on the table, arm of the chair or your lap. If you have a wrist cuff, we  suggest relaxing your wrist against your chest (think of it as Pledging the Flag with the  wrong arm).  3. Place the cuff snugly around your arm, about 1 inch above the crook of your elbow. The  cords should be inside the groove of your elbow.  4. Sit quietly, with the cuff in place, for about 5 minutes. After that 5 minutes press the power  button to start a reading. 5. Do not talk or move while the reading is taking place.  6. Record your readings on a sheet of paper. Although most cuffs have a memory, it is often  easier to see a pattern developing when the numbers are all in front of you.  7. You can repeat the reading after 1-3 minutes if it is recommended  Make sure your bladder is empty and you have not had caffeine or tobacco within the last 30 min  Always bring your blood pressure log with you to your appointments. If you have not brought your monitor in to be double checked for accuracy, please bring it to your next appointment.  You can find a list of validated (accurate) blood pressure cuffs at [employer]   Important lifestyle changes to control high blood pressure  Intervention  Effect on the BP  Lose extra pounds and  watch your waistline Weight loss is one of the most effective lifestyle changes for controlling blood pressure. If you're overweight or obese, losing even a small amount of weight can help reduce blood pressure. Blood pressure might go down by about 1 millimeter of mercury (mm Hg) with each kilogram (about 2.2 pounds) of weight lost.  Exercise regularly As a general goal, aim for at least 30 minutes of moderate physical activity every day. Regular physical activity can lower high blood pressure by about 5 to 8 mm Hg.  Eat a healthy diet Eating a diet rich in whole grains, fruits, vegetables, and low-fat dairy products and low in saturated fat and cholesterol. A healthy diet can lower high blood pressure by up to 11 mm Hg.  Reduce salt (sodium) in your diet Even a small reduction of sodium in the diet can improve heart health and reduce high blood pressure by about 5 to 6 mm Hg.  Limit alcohol One drink equals 12 ounces of beer, 5 ounces of wine, or 1.5 ounces of 80-proof liquor.  Limiting alcohol to less than one drink a day for women or two drinks a day for men can help lower blood pressure by about 4 mm Hg.   Please call me at [phone] with any questions.

## 2022-05-23 NOTE — Assessment & Plan Note (Signed)
Assessment: Blood pressure above goal of <130/80 in clinic Home BP cuff does not appear accurate Asked pt to pick up a new BP cuff- gave recommendations on brand. Can get OMRON at [place] for $35 Need to check BMP today since starting chlorthalidone  Plan: Will check BMP today. If everything looks good, will plan to increase chlorthalidone to 25mg  daily Continue amlodipine 5 mg daily, carvedilol 6.25 mg two times daily Schedule follow up tomorrow based on labs

## 2022-05-24 ENCOUNTER — Telehealth: Payer: Self-pay | Admitting: Pharmacist

## 2022-05-24 LAB — BASIC METABOLIC PANEL
BUN/Creatinine Ratio: 20 (ref 10–24)
BUN: 23 mg/dL (ref 8–27)
CO2: 26 mmol/L (ref 20–29)
Calcium: 10 mg/dL (ref 8.6–10.2)
Chloride: 104 mmol/L (ref 96–106)
Creatinine, Ser: 1.15 mg/dL (ref 0.76–1.27)
Glucose: 93 mg/dL (ref 70–99)
Potassium: 4.3 mmol/L (ref 3.5–5.2)
Sodium: 143 mmol/L (ref 134–144)
eGFR: 64 mL/min/{1.73_m2} (ref >59)

## 2022-05-24 MED ORDER — CHLORTHALIDONE 25 MG PO TABS: 25.0000 mg | ORAL_TABLET | Freq: Every day | ORAL | 3 refills | Status: DC

## 2022-05-24 NOTE — Telephone Encounter (Signed)
Spoke with pt and his wife. Scr stable. Increase chlorthalidone to 25mg  daily. Pt asked to follow up in 3 weeks. Wanted to make it a whole month. Did not want to go to labs separately. Will see in follow up and for BMP on 5/3.

## 2022-06-18 NOTE — Progress Notes (Unsigned)
NEUROLOGY FOLLOW UP OFFICE NOTE  Jeffery Barr [id number]  Assessment/Plan:   Transient ischemic attack involving the left cerebral hemisphere Atrial fibrillation - isolated event in setting of COVID infection.  Likely not cause of previous TIA Hypertension Hyperlipidemia     1  Secondary stroke prevention as managed by PCP/cardiology:             - ASA 81mg  daily.  [facility] waiting to find out approval for anticoagulation.             - Statin therapy.  LDL goal less than 70             - Normotensive blood pressure - follow up with PCP as it is elevated today             - Hgb A1c goal less than 7 2  Smoking cessation 3  Mediterranean diet 4  Routine exercise 5  Follow up 6 months.     Subjective:  Jeffery Barr is a 82 year old right-handed male with HTN, dyslipidemia, sinus bradycardia and CAD who follows up for TIA.  He is accompanied by his wife who supplements history.  UPDATE: Current medications:  ASA 81mg  daily, amlodipine, carvedilol, rosuvastatin, ezetimibe  ***    HISTORY: On 05/30/2021 patient developed acute onset of slurred speech, word-finding difficulty and right facial droop, upper extremity weakness and off-balance.  Most of symptoms resolved within 30 minutes.  Presented to [facility] where he only exhibited mild slurred speech with NIHSS of 1.  MRI of brain showed no acute infarct.  MRA of head showed no LVO or hemodynamically significant stenosis.  Carotid doppler revealed no hemodynamically significant stenosis.  TTE showed EF 60-65% with no cardiac source of embolus such as thrombus or IA shunt.  LDL was 11 and Hgb A1c was 5.2.  He was not on antithrombotic medication prior to admission and was discharged on ASA 81mg  and Plavix 75mg  DAPT for 3 weeks, followed by ASA 81mg  daily monotherapy.  His outpatient atorvastatin 80mg  was changed to rosuvastatin 20mg  daily.  PAST MEDICAL HISTORY: Past Medical History:  Diagnosis Date   Benign  hypertensive heart disease without heart failure    Coronary atherosclerosis of native coronary artery    S/P CABG   Dyslipidemia    HTN (hypertension)    Obesity    Sinus bradycardia    a. baseline HR 50s.    MEDICATIONS: Current Outpatient Medications on File Prior to Visit  Medication Sig Dispense Refill   amLODipine (NORVASC) 5 MG tablet Take 1 tablet (5 mg total) by mouth daily. 180 tablet 3   aspirin EC 81 MG tablet Take 81 mg by mouth daily. Swallow whole.     carvedilol (COREG) 6.25 MG tablet Take 1 tablet (6.25 mg total) by mouth 2 (two) times daily. Replacing the 3.125 mg 180 tablet 3   chlorthalidone (HYGROTON) 25 MG tablet Take 1 tablet (25 mg total) by mouth daily. 90 tablet 3   ezetimibe (ZETIA) 10 MG tablet Take 1 tablet (10 mg total) by mouth daily. 90 tablet 3   rosuvastatin (CRESTOR) 20 MG tablet Take 1 tablet (20 mg total) by mouth daily. 90 tablet 3   No current facility-administered medications on file prior to visit.    ALLERGIES: No Known Allergies  FAMILY HISTORY: Family History  Problem Relation Age of Onset   Heart failure Mother    CAD Mother    Heart disease Brother  CAD Sister    CAD Other    Heart attack Other       Objective:  Blood pressure (!) 155/79, pulse (!) 54, height 5\' 9"  (1.753 m), weight 204 lb (92.5 kg), SpO2 97 %. General: No acute distress.  Patient appears well-groomed.   Head:  Normocephalic/atraumatic Eyes:  Fundi examined but not visualized Neck: supple, no paraspinal tenderness, full range of motion Heart:  Regular rate and rhythm Lungs:  Clear to auscultation bilaterally Back: No paraspinal tenderness Neurological Exam: alert and oriented to person, place, and time.  Speech fluent and not dysarthric, language intact.  CN II-XII intact. Bulk and tone normal, muscle strength 5/5 throughout.  Sensation to light touch intact.  Deep tendon reflexes 2+ throughout, toes downgoing.  Finger to nose testing intact.  Gait normal,  Romberg negative.   Shon Millet, DO  CC: [name], MD

## 2022-06-19 ENCOUNTER — Ambulatory Visit: Payer: Medicare Other | Admitting: Neurology

## 2022-06-20 ENCOUNTER — Encounter: Payer: Self-pay | Admitting: Neurology

## 2022-06-20 ENCOUNTER — Ambulatory Visit (INDEPENDENT_AMBULATORY_CARE_PROVIDER_SITE_OTHER): Payer: Medicare Other | Admitting: Neurology

## 2022-06-20 VITALS — BP 139/58 | HR 54 | Ht 69.0 in | Wt 220.4 lb

## 2022-06-20 DIAGNOSIS — I10 Essential (primary) hypertension: Secondary | ICD-10-CM | POA: Diagnosis not present

## 2022-06-20 DIAGNOSIS — I2510 Atherosclerotic heart disease of native coronary artery without angina pectoris: Secondary | ICD-10-CM

## 2022-06-20 DIAGNOSIS — G459 Transient cerebral ischemic attack, unspecified: Secondary | ICD-10-CM

## 2022-06-20 DIAGNOSIS — I480 Paroxysmal atrial fibrillation: Secondary | ICD-10-CM | POA: Diagnosis not present

## 2022-06-20 DIAGNOSIS — E785 Hyperlipidemia, unspecified: Secondary | ICD-10-CM | POA: Diagnosis not present

## 2022-06-20 NOTE — Patient Instructions (Addendum)
Continue aspirin 81mg  daily, amlodipine, carvedilol, rosuvastatin and ezetimibe.  Ideally, I would like you on a blood thinner like Eliquis instead of aspirin.  Discuss with your cardiologist.   Mediterranean diet (see below) Routine exercise Follow up with me as needed    Mediterranean Diet A Mediterranean diet refers to food and lifestyle choices that are based on the traditions of countries located on the [employer]. It focuses on eating more fruits, vegetables, whole grains, beans, nuts, seeds, and heart-healthy fats, and eating less dairy, meat, eggs, and processed foods with added sugar, salt, and fat. This way of eating has been shown to help prevent certain conditions and improve outcomes for people who have chronic diseases, like kidney disease and heart disease. What are tips for following this plan? Reading food labels Check the serving size of packaged foods. For foods such as rice and pasta, the serving size refers to the amount of cooked product, not dry. Check the total fat in packaged foods. Avoid foods that have saturated fat or trans fats. Check the ingredient list for added sugars, such as corn syrup. Shopping  Buy a variety of foods that offer a balanced diet, including: Fresh fruits and vegetables (produce). Grains, beans, nuts, and seeds. Some of these may be available in unpackaged forms or large amounts (in bulk). Fresh seafood. Poultry and eggs. Low-fat dairy products. Buy whole ingredients instead of prepackaged foods. Buy fresh fruits and vegetables in-season from local farmers markets. Buy plain frozen fruits and vegetables. If you do not have access to quality fresh seafood, buy precooked frozen shrimp or canned fish, such as tuna, salmon, or sardines. Stock your pantry so you always have certain foods on hand, such as olive oil, canned tuna, canned tomatoes, rice, pasta, and beans. Cooking Cook foods with extra-virgin olive oil instead of using butter  or other vegetable oils. Have meat as a side dish, and have vegetables or grains as your main dish. This means having meat in small portions or adding small amounts of meat to foods like pasta or stew. Use beans or vegetables instead of meat in common dishes like chili or lasagna. Experiment with different cooking methods. Try roasting, broiling, steaming, and sauting vegetables. Add frozen vegetables to soups, stews, pasta, or rice. Add nuts or seeds for added healthy fats and plant protein at each meal. You can add these to yogurt, salads, or vegetable dishes. Marinate fish or vegetables using olive oil, lemon juice, garlic, and fresh herbs. Meal planning Plan to eat one vegetarian meal one day each week. Try to work up to two vegetarian meals, if possible. Eat seafood two or more times a week. Have healthy snacks readily available, such as: Vegetable sticks with hummus. Greek yogurt. Fruit and nut trail mix. Eat balanced meals throughout the week. This includes: Fruit: 2-3 servings a day. Vegetables: 4-5 servings a day. Low-fat dairy: 2 servings a day. Fish, poultry, or lean meat: 1 serving a day. Beans and legumes: 2 or more servings a week. Nuts and seeds: 1-2 servings a day. Whole grains: 6-8 servings a day. Extra-virgin olive oil: 3-4 servings a day. Limit red meat and sweets to only a few servings a month. Lifestyle  Cook and eat meals together with your family, when possible. Drink enough fluid to keep your urine pale yellow. Be physically active every day. This includes: Aerobic exercise like running or swimming. Leisure activities like gardening, walking, or housework. Get 7-8 hours of sleep each night. If recommended by your  health care provider, drink red wine in moderation. This means 1 glass a day for nonpregnant women and 2 glasses a day for men. A glass of wine equals 5 oz (150 mL). What foods should I eat? Fruits Apples. Apricots. Avocado. Berries. Bananas.  Cherries. Dates. Figs. Grapes. Lemons. Melon. Oranges. Peaches. Plums. Pomegranate. Vegetables Artichokes. Beets. Broccoli. Cabbage. Carrots. Eggplant. Green beans. Chard. Kale. Spinach. Onions. Leeks. Peas. Squash. Tomatoes. Peppers. Radishes. Grains Whole-grain pasta. Brown rice. Bulgur wheat. Polenta. Couscous. Whole-wheat bread. Orpah Cobb. Meats and other proteins Beans. Almonds. Sunflower seeds. Pine nuts. Peanuts. Cod. Salmon. Scallops. Shrimp. Tuna. Tilapia. Clams. Oysters. Eggs. Poultry without skin. Dairy Low-fat milk. Cheese. Greek yogurt. Fats and oils Extra-virgin olive oil. Avocado oil. Grapeseed oil. Beverages Water. Red wine. Herbal tea. Sweets and desserts Greek yogurt with honey. Baked apples. Poached pears. Trail mix. Seasonings and condiments Basil. Cilantro. Coriander. Cumin. Mint. Parsley. Sage. Rosemary. Tarragon. Garlic. Oregano. Thyme. Pepper. Balsamic vinegar. Tahini. Hummus. Tomato sauce. Olives. Mushrooms. The items listed above may not be a complete list of foods and beverages you can eat. Contact a dietitian for more information. What foods should I limit? This is a list of foods that should be eaten rarely or only on special occasions. Fruits Fruit canned in syrup. Vegetables Deep-fried potatoes (french fries). Grains Prepackaged pasta or rice dishes. Prepackaged cereal with added sugar. Prepackaged snacks with added sugar. Meats and other proteins Beef. Pork. Lamb. Poultry with skin. Hot dogs. Tomasa Blase. Dairy Ice cream. Sour cream. Whole milk. Fats and oils Butter. Canola oil. Vegetable oil. Beef fat (tallow). Lard. Beverages Juice. Sugar-sweetened soft drinks. Beer. Liquor and spirits. Sweets and desserts Cookies. Cakes. Pies. Candy. Seasonings and condiments Mayonnaise. Pre-made sauces and marinades. The items listed above may not be a complete list of foods and beverages you should limit. Contact a dietitian for more  information. Summary The Mediterranean diet includes both food and lifestyle choices. Eat a variety of fresh fruits and vegetables, beans, nuts, seeds, and whole grains. Limit the amount of red meat and sweets that you eat. If recommended by your health care provider, drink red wine in moderation. This means 1 glass a day for nonpregnant women and 2 glasses a day for men. A glass of wine equals 5 oz (150 mL). This information is not intended to replace advice given to you by your health care provider. Make sure you discuss any questions you have with your health care provider. Document Revised: 03/12/2019 Document Reviewed: 01/07/2019 Elsevier Patient Education  2023 [employer].

## 2022-06-21 ENCOUNTER — Ambulatory Visit: Payer: PRIVATE HEALTH INSURANCE

## 2022-11-26 ENCOUNTER — Encounter (HOSPITAL_BASED_OUTPATIENT_CLINIC_OR_DEPARTMENT_OTHER): Payer: Self-pay

## 2022-11-26 ENCOUNTER — Other Ambulatory Visit: Payer: Self-pay

## 2022-11-26 ENCOUNTER — Emergency Department (HOSPITAL_BASED_OUTPATIENT_CLINIC_OR_DEPARTMENT_OTHER)
Admission: EM | Admit: 2022-11-26 | Discharge: 2022-11-27 | Disposition: A | Discharge status: Home / Self Care | Payer: Medicare Other | Attending: Emergency Medicine | Admitting: Emergency Medicine

## 2022-11-26 DIAGNOSIS — H9202 Otalgia, left ear: Secondary | ICD-10-CM | POA: Diagnosis present

## 2022-11-26 DIAGNOSIS — Z7982 Long term (current) use of aspirin: Secondary | ICD-10-CM | POA: Insufficient documentation

## 2022-11-26 DIAGNOSIS — H6092 Unspecified otitis externa, left ear: Secondary | ICD-10-CM | POA: Diagnosis not present

## 2022-11-26 DIAGNOSIS — H60502 Unspecified acute noninfective otitis externa, left ear: Secondary | ICD-10-CM | POA: Diagnosis not present

## 2022-11-26 MED ORDER — IBUPROFEN 400 MG PO TABS
400.0000 mg | ORAL_TABLET | Freq: Once | ORAL | Status: AC | PRN
  Administered 2022-11-26: 400 mg via ORAL
  Filled 2022-11-26: qty 1

## 2022-11-26 NOTE — ED Triage Notes (Signed)
Pt with LT sided earache x1 day. No vision changes, denies fevers. Reports pain just came on and is having some balance issues due to the ear issue. Pt endorses light green drainage; wife reports she put earwax drops in his ear about 1 hour ago. Pt wears hearing aids.

## 2022-11-27 MED ORDER — CIPROFLOXACIN-DEXAMETHASONE 0.3-0.1 % OT SUSP
4.0000 [drp] | Freq: Two times a day (BID) | OTIC | Status: DC
  Administered 2022-11-27: 4 [drp] via OTIC
  Filled 2022-11-27: qty 7.5

## 2022-11-27 NOTE — ED Provider Notes (Signed)
[facility]  Provider Note  CSN: [id number] Arrival date & time: 11/26/22 2157  History Chief Complaint  Patient presents with   Otalgia    Jeffery Barr is a 82 y.o. male wears hearing aids, here for L ear pain since yesterday with some drainage. Wife put some earwax drops in without improvement. No fever.    Home Medications Prior to Admission medications   Medication Sig Start Date End Date Taking? Authorizing Provider  amLODipine (NORVASC) 5 MG tablet Take 1 tablet (5 mg total) by mouth daily. 04/16/22   Georgeanna Lea, MD  aspirin EC 81 MG tablet Take 81 mg by mouth daily. Swallow whole.    [provider]  carvedilol (COREG) 6.25 MG tablet Take 1 tablet (6.25 mg total) by mouth 2 (two) times daily. Replacing the 3.125 mg 04/16/22   Georgeanna Lea, MD  chlorthalidone (HYGROTON) 25 MG tablet Take 1 tablet (25 mg total) by mouth daily. 05/24/22   Georgeanna Lea, MD  ezetimibe (ZETIA) 10 MG tablet Take 1 tablet (10 mg total) by mouth daily. 04/16/22   Georgeanna Lea, MD  rosuvastatin (CRESTOR) 20 MG tablet Take 1 tablet (20 mg total) by mouth daily. 04/16/22   Georgeanna Lea, MD     Allergies    Patient has no known allergies.   Review of Systems   Review of Systems Please see HPI for pertinent positives and negatives  Physical Exam BP (!) 155/64   Pulse 87   Temp 98.3 F (36.8 C) (Oral)   Resp 18   Ht 5\' 9"  (1.753 m)   Wt 99.8 kg   SpO2 95%   BMI 32.49 kg/m   Physical Exam Vitals and nursing note reviewed.  HENT:     Head: Normocephalic.     Right Ear: Tympanic membrane normal.     Left Ear: Tympanic membrane normal.     Ears:     Comments: L canal with mild otitis externa; R canal with mild cerumen    Nose: Nose normal.  Eyes:     Extraocular Movements: Extraocular movements intact.  Pulmonary:     Effort: Pulmonary effort is normal.  Musculoskeletal:        General: Normal  range of motion.     Cervical back: Neck supple.  Skin:    Findings: No rash (on exposed skin).  Neurological:     Mental Status: He is alert and oriented to person, place, and time.  Psychiatric:        Mood and Affect: Mood normal.     ED Results / Procedures / Treatments   EKG None  Procedures Procedures  Medications Ordered in the ED Medications  ciprofloxacin-dexamethasone (CIPRODEX) 0.3-0.1 % OTIC (EAR) suspension 4 drop (has no administration in time range)  ibuprofen (ADVIL) tablet 400 mg (400 mg Oral Given 11/26/22 2214)    Initial Impression and Plan  Patient here with mild/early otitis externa, will begin Ciprodex drops. Motrin for pain. PCP follow up, RTED for any other concerns.    ED Course       MDM Rules/Calculators/A&P Medical Decision Making Problems Addressed: Acute otitis externa of left ear, unspecified type: acute illness or injury  Risk Prescription drug management.     Final Clinical Impression(s) / ED Diagnoses Final diagnoses:  Acute otitis externa of left ear, unspecified type    Rx / DC Orders ED Discharge Orders     None  Pollyann Savoy, MD 11/27/22 [phone]

## 2022-11-27 NOTE — Discharge Instructions (Signed)
Use your ear drops twice a day for 7 days. You can take Motrin or Tylenol as needed for pain.

## 2022-11-27 NOTE — ED Notes (Signed)
Left ear pain started yesterday,  Motrin given in triage.   Pain now 5/10

## 2022-11-29 ENCOUNTER — Other Ambulatory Visit: Payer: Self-pay

## 2022-11-29 ENCOUNTER — Emergency Department (HOSPITAL_COMMUNITY): Payer: Medicare Other

## 2022-11-29 ENCOUNTER — Emergency Department (HOSPITAL_BASED_OUTPATIENT_CLINIC_OR_DEPARTMENT_OTHER): Payer: Medicare Other

## 2022-11-29 ENCOUNTER — Inpatient Hospital Stay (HOSPITAL_BASED_OUTPATIENT_CLINIC_OR_DEPARTMENT_OTHER)
Admission: EM | Admit: 2022-11-29 | Discharge: 2022-12-05 | DRG: 871 | Disposition: A | Discharge status: Home / Self Care | Payer: Medicare Other | Attending: Internal Medicine | Admitting: Internal Medicine

## 2022-11-29 ENCOUNTER — Encounter (HOSPITAL_BASED_OUTPATIENT_CLINIC_OR_DEPARTMENT_OTHER): Payer: Self-pay | Admitting: Pediatrics

## 2022-11-29 DIAGNOSIS — I2489 Other forms of acute ischemic heart disease: Secondary | ICD-10-CM | POA: Diagnosis present

## 2022-11-29 DIAGNOSIS — Z03823 Encounter for observation for suspected inserted (injected) foreign body ruled out: Secondary | ICD-10-CM | POA: Diagnosis not present

## 2022-11-29 DIAGNOSIS — N1831 Chronic kidney disease, stage 3a: Secondary | ICD-10-CM | POA: Diagnosis present

## 2022-11-29 DIAGNOSIS — N189 Chronic kidney disease, unspecified: Secondary | ICD-10-CM | POA: Insufficient documentation

## 2022-11-29 DIAGNOSIS — R9389 Abnormal findings on diagnostic imaging of other specified body structures: Secondary | ICD-10-CM | POA: Diagnosis not present

## 2022-11-29 DIAGNOSIS — M545 Low back pain, unspecified: Secondary | ICD-10-CM | POA: Diagnosis not present

## 2022-11-29 DIAGNOSIS — J189 Pneumonia, unspecified organism: Secondary | ICD-10-CM | POA: Diagnosis present

## 2022-11-29 DIAGNOSIS — I700 Atherosclerosis of aorta: Secondary | ICD-10-CM | POA: Diagnosis not present

## 2022-11-29 DIAGNOSIS — E785 Hyperlipidemia, unspecified: Secondary | ICD-10-CM | POA: Diagnosis not present

## 2022-11-29 DIAGNOSIS — Z87891 Personal history of nicotine dependence: Secondary | ICD-10-CM

## 2022-11-29 DIAGNOSIS — E876 Hypokalemia: Secondary | ICD-10-CM | POA: Diagnosis not present

## 2022-11-29 DIAGNOSIS — I2583 Coronary atherosclerosis due to lipid rich plaque: Secondary | ICD-10-CM | POA: Diagnosis not present

## 2022-11-29 DIAGNOSIS — Z951 Presence of aortocoronary bypass graft: Secondary | ICD-10-CM | POA: Diagnosis not present

## 2022-11-29 DIAGNOSIS — J9601 Acute respiratory failure with hypoxia: Secondary | ICD-10-CM | POA: Diagnosis not present

## 2022-11-29 DIAGNOSIS — Z6832 Body mass index (BMI) 32.0-32.9, adult: Secondary | ICD-10-CM

## 2022-11-29 DIAGNOSIS — R918 Other nonspecific abnormal finding of lung field: Secondary | ICD-10-CM | POA: Diagnosis not present

## 2022-11-29 DIAGNOSIS — I509 Heart failure, unspecified: Secondary | ICD-10-CM | POA: Diagnosis not present

## 2022-11-29 DIAGNOSIS — N179 Acute kidney failure, unspecified: Secondary | ICD-10-CM | POA: Diagnosis present

## 2022-11-29 DIAGNOSIS — J9691 Respiratory failure, unspecified with hypoxia: Secondary | ICD-10-CM | POA: Insufficient documentation

## 2022-11-29 DIAGNOSIS — I130 Hypertensive heart and chronic kidney disease with heart failure and stage 1 through stage 4 chronic kidney disease, or unspecified chronic kidney disease: Secondary | ICD-10-CM | POA: Diagnosis present

## 2022-11-29 DIAGNOSIS — I10 Essential (primary) hypertension: Secondary | ICD-10-CM | POA: Diagnosis not present

## 2022-11-29 DIAGNOSIS — M4317 Spondylolisthesis, lumbosacral region: Secondary | ICD-10-CM | POA: Diagnosis not present

## 2022-11-29 DIAGNOSIS — I4891 Unspecified atrial fibrillation: Secondary | ICD-10-CM | POA: Diagnosis not present

## 2022-11-29 DIAGNOSIS — R001 Bradycardia, unspecified: Secondary | ICD-10-CM | POA: Diagnosis present

## 2022-11-29 DIAGNOSIS — Z8673 Personal history of transient ischemic attack (TIA), and cerebral infarction without residual deficits: Secondary | ICD-10-CM

## 2022-11-29 DIAGNOSIS — J810 Acute pulmonary edema: Secondary | ICD-10-CM | POA: Diagnosis present

## 2022-11-29 DIAGNOSIS — E872 Acidosis, unspecified: Secondary | ICD-10-CM | POA: Diagnosis present

## 2022-11-29 DIAGNOSIS — I5033 Acute on chronic diastolic (congestive) heart failure: Secondary | ICD-10-CM | POA: Diagnosis not present

## 2022-11-29 DIAGNOSIS — N3000 Acute cystitis without hematuria: Secondary | ICD-10-CM | POA: Diagnosis not present

## 2022-11-29 DIAGNOSIS — E7800 Pure hypercholesterolemia, unspecified: Secondary | ICD-10-CM | POA: Diagnosis not present

## 2022-11-29 DIAGNOSIS — R296 Repeated falls: Secondary | ICD-10-CM | POA: Diagnosis present

## 2022-11-29 DIAGNOSIS — I2510 Atherosclerotic heart disease of native coronary artery without angina pectoris: Secondary | ICD-10-CM | POA: Diagnosis present

## 2022-11-29 DIAGNOSIS — M549 Dorsalgia, unspecified: Secondary | ICD-10-CM | POA: Diagnosis not present

## 2022-11-29 DIAGNOSIS — Z79899 Other long term (current) drug therapy: Secondary | ICD-10-CM

## 2022-11-29 DIAGNOSIS — Z7901 Long term (current) use of anticoagulants: Secondary | ICD-10-CM

## 2022-11-29 DIAGNOSIS — F0390 Unspecified dementia without behavioral disturbance: Secondary | ICD-10-CM | POA: Diagnosis present

## 2022-11-29 DIAGNOSIS — R6520 Severe sepsis without septic shock: Secondary | ICD-10-CM | POA: Diagnosis present

## 2022-11-29 DIAGNOSIS — I480 Paroxysmal atrial fibrillation: Secondary | ICD-10-CM | POA: Diagnosis present

## 2022-11-29 DIAGNOSIS — Z8249 Family history of ischemic heart disease and other diseases of the circulatory system: Secondary | ICD-10-CM

## 2022-11-29 DIAGNOSIS — H6690 Otitis media, unspecified, unspecified ear: Secondary | ICD-10-CM | POA: Diagnosis present

## 2022-11-29 DIAGNOSIS — E66811 Obesity, class 1: Secondary | ICD-10-CM | POA: Diagnosis present

## 2022-11-29 DIAGNOSIS — R0602 Shortness of breath: Secondary | ICD-10-CM | POA: Diagnosis not present

## 2022-11-29 DIAGNOSIS — M4310 Spondylolisthesis, site unspecified: Secondary | ICD-10-CM | POA: Diagnosis present

## 2022-11-29 DIAGNOSIS — M47816 Spondylosis without myelopathy or radiculopathy, lumbar region: Secondary | ICD-10-CM | POA: Diagnosis not present

## 2022-11-29 DIAGNOSIS — A419 Sepsis, unspecified organism: Principal | ICD-10-CM | POA: Diagnosis present

## 2022-11-29 DIAGNOSIS — N390 Urinary tract infection, site not specified: Secondary | ICD-10-CM | POA: Diagnosis present

## 2022-11-29 DIAGNOSIS — Z1152 Encounter for screening for COVID-19: Secondary | ICD-10-CM | POA: Diagnosis not present

## 2022-11-29 DIAGNOSIS — I214 Non-ST elevation (NSTEMI) myocardial infarction: Secondary | ICD-10-CM | POA: Diagnosis not present

## 2022-11-29 DIAGNOSIS — I5031 Acute diastolic (congestive) heart failure: Secondary | ICD-10-CM | POA: Diagnosis not present

## 2022-11-29 DIAGNOSIS — J9811 Atelectasis: Secondary | ICD-10-CM | POA: Diagnosis not present

## 2022-11-29 DIAGNOSIS — R509 Fever, unspecified: Secondary | ICD-10-CM | POA: Diagnosis not present

## 2022-11-29 DIAGNOSIS — R7989 Other specified abnormal findings of blood chemistry: Secondary | ICD-10-CM | POA: Diagnosis not present

## 2022-11-29 DIAGNOSIS — N10 Acute tubulo-interstitial nephritis: Secondary | ICD-10-CM | POA: Diagnosis not present

## 2022-11-29 DIAGNOSIS — M51369 Other intervertebral disc degeneration, lumbar region without mention of lumbar back pain or lower extremity pain: Secondary | ICD-10-CM | POA: Diagnosis not present

## 2022-11-29 DIAGNOSIS — M4319 Spondylolisthesis, multiple sites in spine: Secondary | ICD-10-CM | POA: Diagnosis not present

## 2022-11-29 DIAGNOSIS — Z7982 Long term (current) use of aspirin: Secondary | ICD-10-CM

## 2022-11-29 DIAGNOSIS — M48061 Spinal stenosis, lumbar region without neurogenic claudication: Secondary | ICD-10-CM | POA: Diagnosis not present

## 2022-11-29 DIAGNOSIS — R0989 Other specified symptoms and signs involving the circulatory and respiratory systems: Secondary | ICD-10-CM | POA: Diagnosis not present

## 2022-11-29 DIAGNOSIS — I517 Cardiomegaly: Secondary | ICD-10-CM | POA: Diagnosis not present

## 2022-11-29 LAB — URINALYSIS, W/ REFLEX TO CULTURE (INFECTION SUSPECTED)
Glucose, UA: NEGATIVE mg/dL
Ketones, ur: 40 mg/dL — AB
Leukocytes,Ua: NEGATIVE
Nitrite: NEGATIVE
Protein, ur: >=300 mg/dL — AB
Specific Gravity, Urine: >=1.03 (ref 1.005–1.030)
pH: 5.5 (ref 5.0–8.0)

## 2022-11-29 LAB — BASIC METABOLIC PANEL
Anion gap: 16 — ABNORMAL HIGH (ref 5–15)
BUN: 19 mg/dL (ref 8–23)
CO2: 21 mmol/L — ABNORMAL LOW (ref 22–32)
Calcium: 8.9 mg/dL (ref 8.9–10.3)
Chloride: 98 mmol/L (ref 98–111)
Creatinine, Ser: 1.29 mg/dL — ABNORMAL HIGH (ref 0.61–1.24)
GFR, Estimated: 56 mL/min — ABNORMAL LOW (ref >60)
Glucose, Bld: 134 mg/dL — ABNORMAL HIGH (ref 70–99)
Potassium: 3.7 mmol/L (ref 3.5–5.1)
Sodium: 135 mmol/L (ref 135–145)

## 2022-11-29 LAB — C-REACTIVE PROTEIN: CRP: 22.2 mg/dL — ABNORMAL HIGH (ref <1.0)

## 2022-11-29 LAB — LACTIC ACID, PLASMA
Lactic Acid, Venous: 2 mmol/L (ref 0.5–1.9)
Lactic Acid, Venous: 1 mmol/L (ref 0.5–1.9)

## 2022-11-29 LAB — HEPATIC FUNCTION PANEL
ALT: 16 U/L (ref 0–44)
AST: 24 U/L (ref 15–41)
Albumin: 3.5 g/dL (ref 3.5–5.0)
Alkaline Phosphatase: 53 U/L (ref 38–126)
Bilirubin, Direct: 0.2 mg/dL (ref 0.0–0.2)
Indirect Bilirubin: 0.9 mg/dL (ref 0.3–0.9)
Total Bilirubin: 1.1 mg/dL (ref 0.3–1.2)
Total Protein: 7.3 g/dL (ref 6.5–8.1)

## 2022-11-29 LAB — CBC
HCT: 42.1 % (ref 39.0–52.0)
Hemoglobin: 14.2 g/dL (ref 13.0–17.0)
MCH: 29 pg (ref 26.0–34.0)
MCHC: 33.7 g/dL (ref 30.0–36.0)
MCV: 85.9 fL (ref 80.0–100.0)
Platelets: 154 10*3/uL (ref 150–400)
RBC: 4.9 MIL/uL (ref 4.22–5.81)
RDW: 13.3 % (ref 11.5–15.5)
WBC: 9.8 10*3/uL (ref 4.0–10.5)
nRBC: 0 % (ref 0.0–0.2)

## 2022-11-29 LAB — RESP PANEL BY RT-PCR (RSV, FLU A&B, COVID)  RVPGX2
Influenza A by PCR: NEGATIVE
Influenza B by PCR: NEGATIVE
Resp Syncytial Virus by PCR: NEGATIVE
SARS Coronavirus 2 by RT PCR: NEGATIVE

## 2022-11-29 LAB — CBG MONITORING, ED: Glucose-Capillary: 143 mg/dL — ABNORMAL HIGH (ref 70–99)

## 2022-11-29 LAB — SEDIMENTATION RATE: Sed Rate: 85 mm/h — ABNORMAL HIGH (ref 0–16)

## 2022-11-29 LAB — BRAIN NATRIURETIC PEPTIDE: B Natriuretic Peptide: 699.5 pg/mL — ABNORMAL HIGH (ref 0.0–100.0)

## 2022-11-29 MED ORDER — METOPROLOL TARTRATE 5 MG/5ML IV SOLN
2.5000 mg | Freq: Once | INTRAVENOUS | Status: AC
  Administered 2022-11-29: 2.5 mg via INTRAVENOUS
  Filled 2022-11-29: qty 5

## 2022-11-29 MED ORDER — ACETAMINOPHEN 500 MG PO TABS
1000.0000 mg | ORAL_TABLET | ORAL | Status: AC
  Administered 2022-11-29: 1000 mg via ORAL
  Filled 2022-11-29: qty 2

## 2022-11-29 MED ORDER — SODIUM CHLORIDE 0.9 % IV BOLUS
1000.0000 mL | Freq: Once | INTRAVENOUS | Status: AC
  Administered 2022-11-29: 1000 mL via INTRAVENOUS

## 2022-11-29 MED ORDER — VANCOMYCIN HCL IN DEXTROSE 1-5 GM/200ML-% IV SOLN
1000.0000 mg | Freq: Once | INTRAVENOUS | Status: AC
  Administered 2022-11-29: 1000 mg via INTRAVENOUS

## 2022-11-29 MED ORDER — GADOBUTROL 1 MMOL/ML IV SOLN
10.0000 mL | Freq: Once | INTRAVENOUS | Status: AC | PRN
  Administered 2022-11-29: 10 mL via INTRAVENOUS

## 2022-11-29 MED ORDER — SODIUM CHLORIDE 0.9 % IV SOLN
2.0000 g | Freq: Once | INTRAVENOUS | Status: AC
  Administered 2022-11-29: 2 g via INTRAVENOUS
  Filled 2022-11-29: qty 12.5

## 2022-11-29 MED ORDER — VANCOMYCIN HCL IN DEXTROSE 1-5 GM/200ML-% IV SOLN: 1000.0000 mg | Freq: Once | INTRAVENOUS | Status: DC

## 2022-11-29 MED ORDER — METRONIDAZOLE 500 MG/100ML IV SOLN
500.0000 mg | Freq: Once | INTRAVENOUS | Status: AC
  Administered 2022-11-29: 500 mg via INTRAVENOUS
  Filled 2022-11-29: qty 100

## 2022-11-29 MED ORDER — VANCOMYCIN HCL IN DEXTROSE 1-5 GM/200ML-% IV SOLN
1000.0000 mg | Freq: Once | INTRAVENOUS | Status: AC
  Administered 2022-11-30: 1000 mg via INTRAVENOUS
  Filled 2022-11-29: qty 200

## 2022-11-29 MED ORDER — SODIUM CHLORIDE 0.9 % IV BOLUS (SEPSIS)
1000.0000 mL | Freq: Once | INTRAVENOUS | Status: AC
  Administered 2022-11-29: 1000 mL via INTRAVENOUS

## 2022-11-29 NOTE — ED Triage Notes (Incomplete)
Patient BIB [facility] from [facility] to r/o epidural abscess

## 2022-11-29 NOTE — Sepsis Progress Note (Signed)
[facility] is following this Code Sepsis. °

## 2022-11-29 NOTE — Progress Notes (Signed)
ED Pharmacy Antibiotic Sign Off An antibiotic consult was received from an ED provider for sepsis per pharmacy dosing for vancomycin and cefepime. A chart review was completed to assess appropriateness.   The following one time order(s) were placed:  Vancomycin 2000mg  x1  Cefepime 2g x1   Further antibiotic and/or antibiotic pharmacy consults should be ordered by the admitting provider if indicated.   Thank you for allowing pharmacy to be a part of this patient's care.   Estill Batten, PharmD, BCCCP  Clinical Pharmacist 11/29/22 4:16 PM

## 2022-11-29 NOTE — ED Notes (Signed)
Patient transported to MRI 

## 2022-11-29 NOTE — ED Provider Notes (Signed)
Care assumed at 2330.  Patient here for evaluation of possible epidural abscess, here with fever, back pain, weakness.  He has already been started on antibiotics.  UA is concerning for UTI.  Care assumed pending MRI.  MRI is not consistent with epidural abscess although it is a limited study.  Plan to admit to medicine for acute UTI, sepsis.  While awaiting medicine admission patient went to have a bowel movement and developed respiratory distress with tachycardia, hypertension, increased work of breathing with diffuse crackles in all lung fields with sats in the mid 80s.  He does have a tactile temperature, earlier temperature was 100.4.  Suspect there is a higher fever than this.  He was treated with acetaminophen, Lasix for volume overload, BiPAP for respiratory support.  On repeat assessment 1 hour after interventions patient's work of breathing is significantly improved and he does report feeling improved.  Medicine consulted for admission for ongoing care.  CRITICAL CARE Performed by: Tilden Fossa   Total critical care time: 40 minutes  Critical care time was exclusive of separately billable procedures and treating other patients.  Critical care was necessary to treat or prevent imminent or life-threatening deterioration.  Critical care was time spent personally by me on the following activities: development of treatment plan with patient and/or surrogate as well as nursing, discussions with consultants, evaluation of patient's response to treatment, examination of patient, obtaining history from patient or surrogate, ordering and performing treatments and interventions, ordering and review of laboratory studies, ordering and review of radiographic studies, pulse oximetry and re-evaluation of patient's condition.    Tilden Fossa, MD 11/30/22 [phone]

## 2022-11-29 NOTE — ED Triage Notes (Signed)
C/O back pain and bilateral leg weakness. States he has fallen multiple times today.

## 2022-11-29 NOTE — ED Provider Notes (Signed)
[facility] [facility] Provider Note   CSN: [id number] Arrival date & time: 11/29/22  1535     History  Chief Complaint  Patient presents with   Back Pain   Weakness    Jeffery Barr is a 82 y.o. male.  82 year old male with a history of CAD status post CABG, hypertension, hyperlipidemia, and paroxysmal A-fib (in the setting of COVID not currently on treatment) who presents to the emergency department with fever and back pain.  Patient presented 2 days ago to the emergency department with left ear drainage.  Was diagnosed with a ear infection and was given eardrops.  Since going home has had development of back pain and lower extremity weakness.  Has had several falls due to the weakness.  No severe injuries.  No runny nose, sore throat, nausea, vomiting, diarrhea, dysuria, or frequency.  Feels that his legs are weak but does not feel that his upper extremities are weak.  No history of back surgeries.  No history of IV drug use or indwelling lines.       Home Medications Prior to Admission medications   Medication Sig Start Date End Date Taking? Authorizing Provider  amLODipine (NORVASC) 5 MG tablet Take 1 tablet (5 mg total) by mouth daily. 04/16/22   Georgeanna Lea, MD  aspirin EC 81 MG tablet Take 81 mg by mouth daily. Swallow whole.    [provider]  carvedilol (COREG) 6.25 MG tablet Take 1 tablet (6.25 mg total) by mouth 2 (two) times daily. Replacing the 3.125 mg 04/16/22   Georgeanna Lea, MD  chlorthalidone (HYGROTON) 25 MG tablet Take 1 tablet (25 mg total) by mouth daily. 05/24/22   Georgeanna Lea, MD  ezetimibe (ZETIA) 10 MG tablet Take 1 tablet (10 mg total) by mouth daily. 04/16/22   Georgeanna Lea, MD  rosuvastatin (CRESTOR) 20 MG tablet Take 1 tablet (20 mg total) by mouth daily. 04/16/22   Georgeanna Lea, MD      Allergies    Patient has no known allergies.    Review of Systems   Review of  Systems  Physical Exam Updated Vital Signs BP (!) 148/69   Pulse 98   Temp 99.2 F (37.3 C) (Oral)   Resp (!) 21   Ht 5\' 9"  (1.753 m)   Wt 99.8 kg   SpO2 99%   BMI 32.49 kg/m  Physical Exam Vitals and nursing note reviewed.  Constitutional:      General: He is not in acute distress.    Appearance: He is well-developed.  HENT:     Head: Normocephalic and atraumatic.     Right Ear: Tympanic membrane and external ear normal.     Left Ear: Tympanic membrane and external ear normal.     Nose: Nose normal.  Eyes:     Extraocular Movements: Extraocular movements intact.     Conjunctiva/sclera: Conjunctivae normal.     Pupils: Pupils are equal, round, and reactive to light.  Cardiovascular:     Rate and Rhythm: Tachycardia present. Rhythm irregular.     Heart sounds: Normal heart sounds.  Pulmonary:     Effort: Pulmonary effort is normal. No respiratory distress.     Breath sounds: Normal breath sounds.  Abdominal:     General: There is no distension.     Palpations: Abdomen is soft. There is no mass.     Tenderness: There is no abdominal tenderness. There is no guarding.  Musculoskeletal:     Cervical back: Normal range of motion and neck supple.     Right lower leg: No edema.     Left lower leg: No edema.     Comments: No spinal midline TTP in cervical or thoracic spine.  Tenderness to palpation of approximately L2 to S1 spine.  No stepoffs noted.   Motor: Muscle bulk and tone are normal. Strength is 5/5 in hip flexion, knee flexion and extension, ankle dorsiflexion and plantar flexion bilaterally. Full strength of great toe dorsiflexion bilaterally.  Sensory: Intact sensation to light touch in L2 though S1 dermatomes bilaterally.   Skin:    General: Skin is warm and dry.  Neurological:     Mental Status: He is alert. Mental status is at baseline.  Psychiatric:        Mood and Affect: Mood normal.        Behavior: Behavior normal.     ED Results / Procedures /  Treatments   Labs (all labs ordered are listed, but only abnormal results are displayed) Labs Reviewed  BASIC METABOLIC PANEL - Abnormal; Notable for the following components:      Result Value   CO2 21 (*)    Glucose, Bld 134 (*)    Creatinine, Ser 1.29 (*)    GFR, Estimated 56 (*)    Anion gap 16 (*)    All other components within normal limits  SEDIMENTATION RATE - Abnormal; Notable for the following components:   Sed Rate 85 (*)    All other components within normal limits  LACTIC ACID, PLASMA - Abnormal; Notable for the following components:   Lactic Acid, Venous 2.0 (*)    All other components within normal limits  URINALYSIS, W/ REFLEX TO CULTURE (INFECTION SUSPECTED) - Abnormal; Notable for the following components:   APPearance CLOUDY (*)    Hgb urine dipstick LARGE (*)    Bilirubin Urine SMALL (*)    Ketones, ur 40 (*)    Protein, ur >=300 (*)    Bacteria, UA MANY (*)    All other components within normal limits  BRAIN NATRIURETIC PEPTIDE - Abnormal; Notable for the following components:   B Natriuretic Peptide 699.5 (*)    All other components within normal limits  CBG MONITORING, ED - Abnormal; Notable for the following components:   Glucose-Capillary 143 (*)    All other components within normal limits  RESP PANEL BY RT-PCR (RSV, FLU A&B, COVID)  RVPGX2  CULTURE, BLOOD (ROUTINE X 2)  CULTURE, BLOOD (ROUTINE X 2)  URINE CULTURE  CBC  HEPATIC FUNCTION PANEL  LACTIC ACID, PLASMA  URINALYSIS, ROUTINE W REFLEX MICROSCOPIC  C-REACTIVE PROTEIN    EKG EKG Interpretation Date/Time:  Friday November 29 2022 15:56:22 EDT Ventricular Rate:  123 PR Interval:    QRS Duration:  95 QT Interval:  290 QTC Calculation: 415 R Axis:   85  Text Interpretation: Atrial fibrillation Incomplete left bundle branch block Ventricular premature complex Borderline right axis deviation Confirmed by Vonita Moss [phone]) on 11/29/2022 3:59:16 PM  Radiology No results  found.  Procedures Procedures    Medications Ordered in ED Medications  vancomycin (VANCOCIN) IVPB 1000 mg/200 mL premix (has no administration in time range)    And  vancomycin (VANCOCIN) IVPB 1000 mg/200 mL premix (0 mg Intravenous Stopped 11/29/22 1754)  sodium chloride 0.9 % bolus 1,000 mL (0 mLs Intravenous Stopped 11/29/22 1748)  ceFEPIme (MAXIPIME) 2 g in sodium chloride 0.9 % 100 mL IVPB (0  g Intravenous Stopped 11/29/22 1748)  metroNIDAZOLE (FLAGYL) IVPB 500 mg (0 mg Intravenous Stopped 11/29/22 1857)  acetaminophen (TYLENOL) tablet 1,000 mg (1,000 mg Oral Given 11/29/22 1620)  metoprolol tartrate (LOPRESSOR) injection 2.5 mg (2.5 mg Intravenous Given 11/29/22 1857)    ED Course/ Medical Decision Making/ A&P Clinical Course as of 11/29/22 1906  Fri Nov 29, 2022  1640 Creatinine(!): 1.29 Baseline 1.1 [RP]  1815 Dr Maisie Fus from neurosurgery was consulted.  Is aware that we are concerned about a spinal epidural abscess for the patient and that he has been transferred to [facility].  Recommends reconsultation if MRI is abnormal. [RP]    Clinical Course User Index [RP] Rondel Baton, MD                                 Medical Decision Making Amount and/or Complexity of Data Reviewed Labs: ordered. Decision-making details documented in ED Course. Radiology: ordered.  Risk OTC drugs. Prescription drug management.    Thoren Hosang Peet is a 82 y.o. male with comorbidities that complicate the patient evaluation including CAD status post CABG, hypertension, hyperlipidemia, and paroxysmal A-fib (in the setting of COVID not currently on treatment) who presents to the emergency department with fever and back pain.     Initial Ddx:  Sepsis, spinal epidural abscess, UTI, pyelonephritis, A-fib with RVR  MDM/Course:  Patient presents emergency department with back pain and fever.  On exam is in atrial fibrillation with RVR and normal heart rate.  Was also febrile.  Full  strength in his lower extremities.  No bowel or bladder incontinence or other signs of spinal cord compression on history or exam.  Does have midline lumbar spine tenderness to palpation.  Initially was concerned about sepsis so he was started on broad-spectrum antibiotics.  Initial lactate was 2 which cleared to 1 after 1 liter of fluids.  Was also in atrial fibrillation with RVR and was given a small dose of metoprolol after the fluids had finished and his heart rate normalized.  Upon re-evaluation patient remained hemodynamically stable.  Did have x-rays of his back given his fall but did not show any evidence of fracture.  Chest x-ray without signs of pneumonia on my read.  His urinalysis does appear to show urinary tract infection.  Was discussed with Dr. Particia Nearing from [facility] who accepts the patient for transfer for MRI of the spine to rule out spinal epidural abscess.  If negative feel that he should still be admitted to the hospital for urosepsis and new onset A-fib with RVR.  Has a CHA2DS2-VASc of 4 but in case she is to undergo any operative intervention we will hold off on anticoagulation for now but he may benefit from this in the future.  This patient presents to the ED for concern of complaints listed in HPI, this involves an extensive number of treatment options, and is a complaint that carries with it a high risk of complications and morbidity. Disposition including potential need for admission considered.   Dispo: Transfer to [facility] ED  Additional history obtained from spouse Records reviewed Outpatient Clinic Notes The following labs were independently interpreted: Urinalysis and show urinary tract infection I independently reviewed the following imaging with scope of interpretation limited to determining acute life threatening conditions related to emergency care: Chest x-ray and agree with the radiologist interpretation with the following exceptions: none I personally reviewed  and interpreted cardiac  monitoring: atrial fibrillation with RVR I personally reviewed and interpreted the pt's EKG: see above for interpretation  I have reviewed the patients home medications and made adjustments as needed Consults: Neurosurgery Social Determinants of health:  Elderly  Portions of this note were generated with Scientist, clinical (histocompatibility and immunogenetics). Dictation errors may occur despite best attempts at proofreading.    CRITICAL CARE Performed by: Rondel Baton   Total critical care time: 45 minutes  Critical care time was exclusive of separately billable procedures and treating other patients.  Critical care was necessary to treat or prevent imminent or life-threatening deterioration.  Critical care was time spent personally by me on the following activities: development of treatment plan with patient and/or surrogate as well as nursing, discussions with consultants, evaluation of patient's response to treatment, examination of patient, obtaining history from patient or surrogate, ordering and performing treatments and interventions, ordering and review of laboratory studies, ordering and review of radiographic studies, pulse oximetry and re-evaluation of patient's condition.   Final Clinical Impression(s) / ED Diagnoses Final diagnoses:  Sepsis with acute organ dysfunction without septic shock, due to unspecified organism, unspecified organ dysfunction type (HCC)  Acute cystitis without hematuria  Acute midline low back pain without sciatica  Atrial fibrillation with RVR [facility])    Rx / DC Orders ED Discharge Orders     None         Rondel Baton, MD 11/29/22 1906

## 2022-11-29 NOTE — ED Provider Notes (Signed)
    ED Course / MDM   Clinical Course as of 11/29/22 2319  [facility] Nov 29, 2022  1640 Creatinine(!): 1.29 Baseline 1.1 [RP]  1815 Dr Maisie Fus from neurosurgery was consulted.  Is aware that we are concerned about a spinal epidural abscess for the patient and that he has been transferred to [facility].  Recommends reconsultation if MRI is abnormal. [RP]  2305 Patient arrived from outside ER for MRI. MRI has been completed. Pending result of this. Signed out to Dr. Madilyn Hook pending MRI results.  [WS]    Clinical Course User Index [RP] Rondel Baton, MD [WS] Lonell Grandchild, MD   Medical Decision Making Amount and/or Complexity of Data Reviewed Labs: ordered. Decision-making details documented in ED Course. Radiology: ordered.  Risk OTC drugs. Prescription drug management.         Lonell Grandchild, MD 11/29/22 [phone]

## 2022-11-30 ENCOUNTER — Inpatient Hospital Stay (HOSPITAL_COMMUNITY): Payer: Medicare Other

## 2022-11-30 ENCOUNTER — Emergency Department (HOSPITAL_COMMUNITY): Payer: Medicare Other

## 2022-11-30 DIAGNOSIS — R0989 Other specified symptoms and signs involving the circulatory and respiratory systems: Secondary | ICD-10-CM | POA: Diagnosis not present

## 2022-11-30 DIAGNOSIS — E66811 Obesity, class 1: Secondary | ICD-10-CM | POA: Diagnosis present

## 2022-11-30 DIAGNOSIS — J9811 Atelectasis: Secondary | ICD-10-CM | POA: Diagnosis present

## 2022-11-30 DIAGNOSIS — R7989 Other specified abnormal findings of blood chemistry: Secondary | ICD-10-CM | POA: Diagnosis not present

## 2022-11-30 DIAGNOSIS — I5033 Acute on chronic diastolic (congestive) heart failure: Secondary | ICD-10-CM | POA: Diagnosis not present

## 2022-11-30 DIAGNOSIS — I2510 Atherosclerotic heart disease of native coronary artery without angina pectoris: Secondary | ICD-10-CM | POA: Diagnosis present

## 2022-11-30 DIAGNOSIS — I214 Non-ST elevation (NSTEMI) myocardial infarction: Secondary | ICD-10-CM

## 2022-11-30 DIAGNOSIS — N10 Acute tubulo-interstitial nephritis: Secondary | ICD-10-CM | POA: Diagnosis not present

## 2022-11-30 DIAGNOSIS — J9601 Acute respiratory failure with hypoxia: Secondary | ICD-10-CM

## 2022-11-30 DIAGNOSIS — J9691 Respiratory failure, unspecified with hypoxia: Secondary | ICD-10-CM | POA: Insufficient documentation

## 2022-11-30 DIAGNOSIS — N3000 Acute cystitis without hematuria: Secondary | ICD-10-CM | POA: Diagnosis present

## 2022-11-30 DIAGNOSIS — I4891 Unspecified atrial fibrillation: Secondary | ICD-10-CM | POA: Diagnosis not present

## 2022-11-30 DIAGNOSIS — Z79899 Other long term (current) drug therapy: Secondary | ICD-10-CM | POA: Diagnosis not present

## 2022-11-30 DIAGNOSIS — R6520 Severe sepsis without septic shock: Secondary | ICD-10-CM

## 2022-11-30 DIAGNOSIS — E876 Hypokalemia: Secondary | ICD-10-CM | POA: Diagnosis not present

## 2022-11-30 DIAGNOSIS — I517 Cardiomegaly: Secondary | ICD-10-CM | POA: Diagnosis not present

## 2022-11-30 DIAGNOSIS — I10 Essential (primary) hypertension: Secondary | ICD-10-CM | POA: Diagnosis not present

## 2022-11-30 DIAGNOSIS — E872 Acidosis, unspecified: Secondary | ICD-10-CM | POA: Diagnosis present

## 2022-11-30 DIAGNOSIS — Z87891 Personal history of nicotine dependence: Secondary | ICD-10-CM | POA: Diagnosis not present

## 2022-11-30 DIAGNOSIS — Z951 Presence of aortocoronary bypass graft: Secondary | ICD-10-CM | POA: Diagnosis not present

## 2022-11-30 DIAGNOSIS — A419 Sepsis, unspecified organism: Secondary | ICD-10-CM | POA: Diagnosis present

## 2022-11-30 DIAGNOSIS — R918 Other nonspecific abnormal finding of lung field: Secondary | ICD-10-CM | POA: Diagnosis not present

## 2022-11-30 DIAGNOSIS — I480 Paroxysmal atrial fibrillation: Secondary | ICD-10-CM | POA: Diagnosis present

## 2022-11-30 DIAGNOSIS — R0602 Shortness of breath: Secondary | ICD-10-CM | POA: Diagnosis not present

## 2022-11-30 DIAGNOSIS — N189 Chronic kidney disease, unspecified: Secondary | ICD-10-CM | POA: Diagnosis not present

## 2022-11-30 DIAGNOSIS — F0390 Unspecified dementia without behavioral disturbance: Secondary | ICD-10-CM | POA: Diagnosis present

## 2022-11-30 DIAGNOSIS — Z6832 Body mass index (BMI) 32.0-32.9, adult: Secondary | ICD-10-CM | POA: Diagnosis not present

## 2022-11-30 DIAGNOSIS — E7800 Pure hypercholesterolemia, unspecified: Secondary | ICD-10-CM | POA: Diagnosis not present

## 2022-11-30 DIAGNOSIS — Z1152 Encounter for screening for COVID-19: Secondary | ICD-10-CM | POA: Diagnosis not present

## 2022-11-30 DIAGNOSIS — I2583 Coronary atherosclerosis due to lipid rich plaque: Secondary | ICD-10-CM | POA: Diagnosis not present

## 2022-11-30 DIAGNOSIS — E785 Hyperlipidemia, unspecified: Secondary | ICD-10-CM | POA: Diagnosis present

## 2022-11-30 DIAGNOSIS — N1831 Chronic kidney disease, stage 3a: Secondary | ICD-10-CM | POA: Diagnosis present

## 2022-11-30 DIAGNOSIS — J189 Pneumonia, unspecified organism: Secondary | ICD-10-CM | POA: Diagnosis present

## 2022-11-30 DIAGNOSIS — R9389 Abnormal findings on diagnostic imaging of other specified body structures: Secondary | ICD-10-CM | POA: Diagnosis not present

## 2022-11-30 DIAGNOSIS — I509 Heart failure, unspecified: Secondary | ICD-10-CM | POA: Diagnosis not present

## 2022-11-30 DIAGNOSIS — N179 Acute kidney failure, unspecified: Secondary | ICD-10-CM | POA: Diagnosis present

## 2022-11-30 DIAGNOSIS — N390 Urinary tract infection, site not specified: Secondary | ICD-10-CM | POA: Diagnosis present

## 2022-11-30 DIAGNOSIS — J810 Acute pulmonary edema: Secondary | ICD-10-CM | POA: Diagnosis present

## 2022-11-30 DIAGNOSIS — I130 Hypertensive heart and chronic kidney disease with heart failure and stage 1 through stage 4 chronic kidney disease, or unspecified chronic kidney disease: Secondary | ICD-10-CM | POA: Diagnosis present

## 2022-11-30 DIAGNOSIS — I2489 Other forms of acute ischemic heart disease: Secondary | ICD-10-CM | POA: Diagnosis present

## 2022-11-30 DIAGNOSIS — I5031 Acute diastolic (congestive) heart failure: Secondary | ICD-10-CM | POA: Diagnosis not present

## 2022-11-30 HISTORY — DX: Acute respiratory failure with hypoxia: J96.01

## 2022-11-30 HISTORY — DX: Sepsis, unspecified organism: A41.9

## 2022-11-30 HISTORY — DX: Respiratory failure, unspecified with hypoxia: J96.91

## 2022-11-30 HISTORY — DX: Acute on chronic diastolic (congestive) heart failure: I50.33

## 2022-11-30 HISTORY — DX: Sepsis, unspecified organism: R65.20

## 2022-11-30 HISTORY — DX: Unspecified atrial fibrillation: I48.91

## 2022-11-30 HISTORY — DX: Non-ST elevation (NSTEMI) myocardial infarction: I21.4

## 2022-11-30 LAB — TROPONIN I (HIGH SENSITIVITY)
Troponin I (High Sensitivity): 2083 ng/L (ref <18)
Troponin I (High Sensitivity): 2564 ng/L (ref <18)
Troponin I (High Sensitivity): 2041 ng/L (ref <18)

## 2022-11-30 LAB — BASIC METABOLIC PANEL
Anion gap: 11 (ref 5–15)
BUN: 18 mg/dL (ref 8–23)
CO2: 20 mmol/L — ABNORMAL LOW (ref 22–32)
Calcium: 8.2 mg/dL — ABNORMAL LOW (ref 8.9–10.3)
Chloride: 105 mmol/L (ref 98–111)
Creatinine, Ser: 1.49 mg/dL — ABNORMAL HIGH (ref 0.61–1.24)
GFR, Estimated: 47 mL/min — ABNORMAL LOW (ref >60)
Glucose, Bld: 146 mg/dL — ABNORMAL HIGH (ref 70–99)
Potassium: 3.7 mmol/L (ref 3.5–5.1)
Sodium: 136 mmol/L (ref 135–145)

## 2022-11-30 LAB — CBC
HCT: 37.7 % — ABNORMAL LOW (ref 39.0–52.0)
Hemoglobin: 12.4 g/dL — ABNORMAL LOW (ref 13.0–17.0)
MCH: 29.1 pg (ref 26.0–34.0)
MCHC: 32.9 g/dL (ref 30.0–36.0)
MCV: 88.5 fL (ref 80.0–100.0)
Platelets: 145 10*3/uL — ABNORMAL LOW (ref 150–400)
RBC: 4.26 MIL/uL (ref 4.22–5.81)
RDW: 13.8 % (ref 11.5–15.5)
WBC: 10.1 10*3/uL (ref 4.0–10.5)
nRBC: 0 % (ref 0.0–0.2)

## 2022-11-30 LAB — LIPID PANEL
Cholesterol: 135 mg/dL (ref 0–200)
HDL: 38 mg/dL — ABNORMAL LOW (ref >40)
LDL Cholesterol: 84 mg/dL (ref 0–99)
Total CHOL/HDL Ratio: 3.6 {ratio}
Triglycerides: 65 mg/dL (ref <150)
VLDL: 13 mg/dL (ref 0–40)

## 2022-11-30 LAB — MAGNESIUM: Magnesium: 1.9 mg/dL (ref 1.7–2.4)

## 2022-11-30 LAB — PHOSPHORUS: Phosphorus: 2.7 mg/dL (ref 2.5–4.6)

## 2022-11-30 LAB — HEPARIN LEVEL (UNFRACTIONATED): Heparin Unfractionated: 0.18 [IU]/mL — ABNORMAL LOW (ref 0.30–0.70)

## 2022-11-30 LAB — SARS CORONAVIRUS 2 BY RT PCR: SARS Coronavirus 2 by RT PCR: NEGATIVE

## 2022-11-30 LAB — HEMOGLOBIN A1C
Hgb A1c MFr Bld: 5.6 % (ref 4.8–5.6)
Mean Plasma Glucose: 114.02 mg/dL

## 2022-11-30 LAB — ECHOCARDIOGRAM COMPLETE
Height: 69 in
S' Lateral: 3.7 cm
Weight: 3519.99 [oz_av]

## 2022-11-30 MED ORDER — SODIUM CHLORIDE 0.9 % IV SOLN
1.0000 g | INTRAVENOUS | Status: AC
  Administered 2022-11-30 – 2022-12-04 (×5): 1 g via INTRAVENOUS
  Filled 2022-11-30 (×5): qty 10

## 2022-11-30 MED ORDER — DIGOXIN 0.25 MG/ML IJ SOLN
0.5000 mg | Freq: Once | INTRAMUSCULAR | Status: AC
  Administered 2022-11-30: 0.5 mg via INTRAVENOUS
  Filled 2022-11-30: qty 2

## 2022-11-30 MED ORDER — ACETAMINOPHEN 500 MG PO TABS
1000.0000 mg | ORAL_TABLET | Freq: Four times a day (QID) | ORAL | Status: DC | PRN
  Administered 2022-11-30 – 2022-12-01 (×2): 1000 mg via ORAL
  Filled 2022-11-30 (×2): qty 2

## 2022-11-30 MED ORDER — ENOXAPARIN SODIUM 40 MG/0.4ML IJ SOSY: 40.0000 mg | PREFILLED_SYRINGE | INTRAMUSCULAR | Status: DC

## 2022-11-30 MED ORDER — SODIUM CHLORIDE 0.9% FLUSH
3.0000 mL | Freq: Two times a day (BID) | INTRAVENOUS | Status: DC
  Administered 2022-11-30 – 2022-12-05 (×11): 3 mL via INTRAVENOUS

## 2022-11-30 MED ORDER — PERFLUTREN LIPID MICROSPHERE
1.0000 mL | INTRAVENOUS | Status: AC | PRN
  Administered 2022-11-30: 6 mL via INTRAVENOUS

## 2022-11-30 MED ORDER — ROSUVASTATIN CALCIUM 20 MG PO TABS
40.0000 mg | ORAL_TABLET | Freq: Every day | ORAL | Status: DC
  Administered 2022-11-30 – 2022-12-05 (×6): 40 mg via ORAL
  Filled 2022-11-30 (×6): qty 2

## 2022-11-30 MED ORDER — HEPARIN BOLUS VIA INFUSION
2500.0000 [IU] | Freq: Once | INTRAVENOUS | Status: AC
  Administered 2022-11-30: 2500 [IU] via INTRAVENOUS
  Filled 2022-11-30: qty 2500

## 2022-11-30 MED ORDER — FUROSEMIDE 10 MG/ML IJ SOLN
40.0000 mg | Freq: Once | INTRAMUSCULAR | Status: AC
  Administered 2022-11-30: 40 mg via INTRAVENOUS
  Filled 2022-11-30: qty 4

## 2022-11-30 MED ORDER — FUROSEMIDE 10 MG/ML IJ SOLN
20.0000 mg | Freq: Once | INTRAMUSCULAR | Status: AC
  Administered 2022-11-30: 20 mg via INTRAVENOUS
  Filled 2022-11-30: qty 2

## 2022-11-30 MED ORDER — ACETAMINOPHEN 650 MG RE SUPP
650.0000 mg | RECTAL | Status: DC | PRN
  Administered 2022-11-30: 650 mg via RECTAL
  Filled 2022-11-30: qty 1

## 2022-11-30 MED ORDER — CARVEDILOL 6.25 MG PO TABS
6.2500 mg | ORAL_TABLET | Freq: Two times a day (BID) | ORAL | Status: DC
  Administered 2022-11-30 – 2022-12-03 (×7): 6.25 mg via ORAL
  Filled 2022-11-30 (×4): qty 1
  Filled 2022-11-30: qty 2
  Filled 2022-11-30 (×2): qty 1

## 2022-11-30 MED ORDER — VANCOMYCIN HCL 1250 MG/250ML IV SOLN: 1250.0000 mg | INTRAVENOUS | Status: DC

## 2022-11-30 MED ORDER — HEPARIN (PORCINE) 25000 UT/250ML-% IV SOLN
1250.0000 [IU]/h | INTRAVENOUS | Status: DC
  Administered 2022-11-30: 1000 [IU]/h via INTRAVENOUS
  Administered 2022-12-01 (×2): 1250 [IU]/h via INTRAVENOUS
  Filled 2022-11-30 (×3): qty 250

## 2022-11-30 MED ORDER — ASPIRIN 81 MG PO CHEW
324.0000 mg | CHEWABLE_TABLET | Freq: Once | ORAL | Status: AC
  Administered 2022-11-30: 324 mg via ORAL
  Filled 2022-11-30: qty 4

## 2022-11-30 MED ORDER — HEPARIN BOLUS VIA INFUSION
4000.0000 [IU] | Freq: Once | INTRAVENOUS | Status: AC
  Administered 2022-11-30: 4000 [IU] via INTRAVENOUS
  Filled 2022-11-30: qty 4000

## 2022-11-30 MED ORDER — ALBUTEROL SULFATE (2.5 MG/3ML) 0.083% IN NEBU: 2.5000 mg | INHALATION_SOLUTION | Freq: Four times a day (QID) | RESPIRATORY_TRACT | Status: DC | PRN

## 2022-11-30 MED ORDER — SODIUM CHLORIDE 0.9 % IV SOLN
2.0000 g | Freq: Two times a day (BID) | INTRAVENOUS | Status: DC
  Administered 2022-11-30: 2 g via INTRAVENOUS
  Filled 2022-11-30: qty 12.5

## 2022-11-30 MED ORDER — EZETIMIBE 10 MG PO TABS
10.0000 mg | ORAL_TABLET | Freq: Every day | ORAL | Status: DC
  Administered 2022-11-30 – 2022-12-05 (×6): 10 mg via ORAL
  Filled 2022-11-30 (×6): qty 1

## 2022-11-30 NOTE — Consult Note (Signed)
Cardiology Consultation:   Patient ID: Jeffery Barr MRN: [id number]; DOB: 08-03-1940  Admit date: 11/29/2022 Date of Consult: 11/30/2022  Primary Care Provider: [name], MD Primary Cardiologist: Armanda Magic, MD  Primary Electrophysiologist:  None    Patient Profile:   Jeffery Barr is a 82 y.o. male with a hx of coronary disease status post CABG in 2002, hypertension, dyslipidemia, obesity and paroxysmal atrial fibrillation with former tobacco abuse who is being seen today for the evaluation of non-ST elevation myocardial infarction at the request of ED.  While in the ED reportedly desaturated requiring BiPAP with a rapid arrhythmia and received diuresis with IV Lasix 20 mg.  Patient responded well to this and has been able to been transition down to nasal cannula.  He denies any significant chest discomfort.  And does not feel rapid heart rate.  History of Present Illness:   Jeffery Barr is a pleasant gentleman who presented to the hospital after significant weakness of his lower extremities and was transferred from outside hospital for evaluation  Epidural abscess.  Reportedly he could not get up from a seated position earlier today.  Past Medical History:  Diagnosis Date   Benign hypertensive heart disease without heart failure    Coronary atherosclerosis of native coronary artery    S/P CABG   Dyslipidemia    HTN (hypertension)    Obesity    Sinus bradycardia    a. baseline HR 50s.    Past Surgical History:  Procedure Laterality Date   CORONARY ARTERY BYPASS GRAFT     VASECTOMY       Home Medications:  Prior to Admission medications   Medication Sig Start Date End Date Taking? Authorizing Provider  amLODipine (NORVASC) 5 MG tablet Take 1 tablet (5 mg total) by mouth daily. 04/16/22  Yes Georgeanna Lea, MD  aspirin EC 81 MG tablet Take 81 mg by mouth daily. Swallow whole.   Yes [provider]  carvedilol (COREG) 6.25 MG tablet Take 1  tablet (6.25 mg total) by mouth 2 (two) times daily. Replacing the 3.125 mg 04/16/22  Yes Georgeanna Lea, MD  chlorthalidone (HYGROTON) 25 MG tablet Take 1 tablet (25 mg total) by mouth daily. 05/24/22  Yes Georgeanna Lea, MD  ezetimibe (ZETIA) 10 MG tablet Take 1 tablet (10 mg total) by mouth daily. 04/16/22  Yes Georgeanna Lea, MD  rosuvastatin (CRESTOR) 20 MG tablet Take 1 tablet (20 mg total) by mouth daily. 04/16/22  Yes Georgeanna Lea, MD    Inpatient Medications: Scheduled Meds:  carvedilol  6.25 mg Oral BID   enoxaparin (LOVENOX) injection  40 mg Subcutaneous Q24H   ezetimibe  10 mg Oral Daily   rosuvastatin  40 mg Oral Daily   sodium chloride flush  3 mL Intravenous Q12H   Continuous Infusions:  ceFEPime (MAXIPIME) IV Stopped (11/30/22 0503)   heparin 1,000 Units/hr (11/30/22 0624)   PRN Meds: acetaminophen, albuterol  Allergies:   No Known Allergies  Social History:   Social History   Socioeconomic History   Marital status: Married    Spouse name: Not on file   Number of children: Not on file   Years of education: Not on file   Highest education level: Not on file  Occupational History   Not on file  Tobacco Use   Smoking status: Former   Smokeless tobacco: Never  Vaping Use   Vaping status: Never Used  Substance and Sexual Activity   Alcohol use:  Yes    Alcohol/week: 3.0 - 5.0 standard drinks of alcohol    Types: 1 - 2 Glasses of wine, 1 - 2 Cans of beer, 1 Shots of liquor per week    Comment: 1-2 glasses of wine and beer weekly   Drug use: No   Sexual activity: Not on file  Other Topics Concern   Not on file  Social History Narrative   Right handed   Lives with wife,   One story home,Retired.   Social Determinants of Health   Financial Resource Strain: Not on file  Food Insecurity: Not on file  Transportation Needs: Not on file  Physical Activity: Not on file  Stress: Not on file  Social Connections: Not on file  Intimate Partner  Violence: Not on file    Family History:    Family History  Problem Relation Age of Onset   Heart failure Mother    CAD Mother    Heart disease Brother    CAD Sister    CAD Other    Heart attack Other       Review of Systems: [y] = yes, [ ]  = no   General: Weight gain [ ] ; Weight loss [ ] ; Anorexia [ ] ; Fatigue [ ] ; Fever [ ] ; Chills [ ] ; Weakness [ ]   Cardiac: Chest pain/pressure [ ] ; Resting SOB [place] ]; Exertional SOB [ ] ; Orthopnea [ ] ; Pedal Edema [ ] ; Palpitations [ ] ; Syncope [ ] ; Presyncope [ ] ; Paroxysmal nocturnal dyspnea[ ]   Pulmonary: Cough [ ] ; Wheezing[ ] ; Hemoptysis[ ] ; Sputum [ ] ; Snoring [ ]   GI: Vomiting[ ] ; Dysphagia[ ] ; Melena[ ] ; Hematochezia [ ] ; Heartburn[ ] ; Abdominal pain [ ] ; Constipation [ ] ; Diarrhea [ ] ; BRBPR [ ]   GU: Hematuria[ ] ; Dysuria [ ] ; Nocturia[ ]   Vascular: Pain in legs with walking [ ] ; Pain in feet with lying flat [ ] ; Non-healing sores [ ] ; Stroke [ ] ; TIA [ ] ; Slurred speech [ ] ;  Neuro: Headaches[ ] ; Vertigo[ ] ; Seizures[ ] ; Paresthesias[ ] ;Blurred vision [ ] ; Diplopia [ ] ; Vision changes [ ]   Ortho/Skin: Arthritis [ ] ; Joint pain [ ] ; Muscle pain [ ] ; Joint swelling [ ] ; Back Pain [ ] ; Rash [ ]   Psych: Depression[ ] ; Anxiety[ ]   Heme: Bleeding problems [ ] ; Clotting disorders [ ] ; Anemia [ ]   Endocrine: Diabetes [ ] ; Thyroid dysfunction[ ]   Physical Exam/Data:   Vitals:   11/30/22 0500 11/30/22 0530 11/30/22 0542 11/30/22 0545  BP: 111/75 101/72  118/73  Pulse: 86 91  (!) 105  Resp: (!) 27 (!) 26  (!) 27  Temp:   98.8 F (37.1 C)   TempSrc:   Oral   SpO2: 99% 99%  95%  Weight:      Height:        Intake/Output Summary (Last 24 hours) at 11/30/2022 0719 Last data filed at 11/30/2022 0327 Gross per 24 hour  Intake 1539.86 ml  Output --  Net 1539.86 ml   Filed Weights   11/29/22 1547  Weight: 99.8 kg   Body mass index is 32.49 kg/m.  General:  Well nourished, well developed, in no acute distress HEENT: normal Lymph: no  adenopathy Neck: no JVD Endocrine:  No thryomegaly Vascular: No carotid bruits; FA pulses 2+ bilaterally without bruits  Cardiac:  normal S1, S2; irregular irregular rate and rhythm; no murmur  Lungs:  clear to auscultation bilaterally, no wheezing, rhonchi or rales  Abd: soft, nontender, no hepatomegaly  Ext: no edema Musculoskeletal:  No deformities, BUE and BLE strength normal and equal Skin: warm and dry  Neuro:  CNs 2-12 intact, no focal abnormalities noted Psych:  Normal affect   EKG:  The EKG was personally reviewed and demonstrates: Atrial fibrillation Telemetry:  Telemetry was personally reviewed and demonstrates: Atrial fibrillation  Relevant CV Studies: Echo 05/2021 1. Left ventricular ejection fraction, by estimation, is 60 to 65% . The left ventricle has normal function. The left ventricle has no regional wall motion abnormalities. There is moderate left ventricular hypertrophy. Left ventricular diastolic parameters were normal. 2. Right ventricular systolic function is mildly reduced. The right ventricular size is normal. There is mildly elevated pulmonary artery systolic pressure. 3. The mitral valve is normal in structure. No evidence of mitral valve regurgitation. 4. The aortic valve is normal in structure. Aortic valve regurgitation is not visualized. No aortic stenosis is present.  Laboratory Data:  Chemistry Recent Labs  Lab 11/29/22 1605 11/30/22 0433  NA 135 136  K 3.7 3.7  CL 98 105  CO2 21* 20*  GLUCOSE 134* 146*  BUN 19 18  CREATININE 1.29* 1.49*  CALCIUM 8.9 8.2*  GFRNONAA 56* 47*  ANIONGAP 16* 11    Recent Labs  Lab 11/29/22 1605  PROT 7.3  ALBUMIN 3.5  AST 24  ALT 16  ALKPHOS 53  BILITOT 1.1   Hematology Recent Labs  Lab 11/29/22 1605 11/30/22 0433  WBC 9.8 10.1  RBC 4.90 4.26  HGB 14.2 12.4*  HCT 42.1 37.7*  MCV 85.9 88.5  MCH 29.0 29.1  MCHC 33.7 32.9  RDW 13.3 13.8  PLT 154 145*   Cardiac EnzymesNo results for input(s):  "TROPONINI" in the last 168 hours. No results for input(s): "TROPIPOC" in the last 168 hours.  BNP Recent Labs  Lab 11/29/22 1612  BNP 699.5*    DDimer No results for input(s): "DDIMER" in the last 168 hours.  Radiology/Studies:  DG Chest Port 1 View  Result Date: 11/30/2022 CLINICAL DATA:  Shortness of breath. EXAM: PORTABLE CHEST 1 VIEW COMPARISON:  November 29, 2022 FINDINGS: Multiple sternal wires are noted. The cardiac silhouette is borderline in size and stable in appearance. Low lung volumes are seen with chronic, moderate severity elevation of the right hemidiaphragm. Mild diffusely increased interstitial lung markings are noted with stable prominence of the pulmonary vasculature. Mild bibasilar atelectasis is seen, right greater than left. No pleural effusion or pneumothorax is identified. Multilevel degenerative changes are seen throughout the thoracic spine. IMPRESSION: 1. Stable pulmonary vascular congestion with a mild component of suspected interstitial edema. 2. Chronic elevation of the right hemidiaphragm with mild bibasilar atelectasis, right greater than left. Electronically Signed   By: Aram Candela M.D.   On: 11/30/2022 02:07   DG Skull 1-3 Views  Result Date: 11/29/2022 CLINICAL DATA:  Foreign body in auricle.  MRI clearance. EXAM: SKULL - 1-3 VIEW COMPARISON:  None Available. FINDINGS: There is no evidence of skull fracture or other focal bone lesions. No radiopaque foreign body is seen. IMPRESSION: Negative. Electronically Signed   By: Thornell Sartorius M.D.   On: 11/29/2022 23:48   MR Lumbar Spine W Wo Contrast  Result Date: 11/29/2022 CLINICAL DATA:  Initial evaluation for concern for spinal epidural abscess. EXAM: MRI CERVICAL, THORACIC AND LUMBAR SPINE WITHOUT AND WITH CONTRAST TECHNIQUE: Multiplanar and multiecho pulse sequences of the cervical spine, to include the craniocervical junction and cervicothoracic junction, and thoracic and lumbar spine, were obtained  without and with  intravenous contrast. CONTRAST:  10mL GADAVIST GADOBUTROL 1 MMOL/ML IV SOLN COMPARISON:  None Available. FINDINGS: MRI CERVICAL SPINE FINDINGS Alignment: Please note that this is a limited screening sagittal only MRI for screening of possible epidural abscess or other spinal infection. No axial images were performed. Additionally, the provided sequences are degraded by motion artifact. Straightening of the normal cervical lordosis.  No listhesis. Vertebrae: Vertebral body height maintained without acute or chronic fracture. Bone marrow signal intensity within normal limits. No worrisome osseous lesions. No visible evidence for osteomyelitis discitis or septic arthritis. Cord: Grossly normal signal and morphology. No visible epidural abscess or other collection. No definite abnormal enhancement, although the postcontrast sequence is markedly degraded by motion. Posterior Fossa, vertebral arteries, paraspinal tissues: No acute finding. Disc levels: No significant cervical spondylosis for age. No spinal stenosis. Foramina appear grossly patent. MRI THORACIC SPINE FINDINGS Alignment: Physiologic with preservation of the normal thoracic kyphosis. No listhesis. Vertebrae: Vertebral body height maintained without acute or chronic fracture. No worrisome osseous lesions. No MRI evidence for osteomyelitis discitis or septic arthritis. Cord: Grossly normal signal and morphology on this motion degraded exam. No visible epidural abscess or other collection. No abnormal enhancement, although the postcontrast sequences are markedly degraded by motion. Paraspinal and other soft tissues: No acute finding. Disc levels: No significant disc pathology within the thoracic spine for age. No spinal stenosis. Foramina appear grossly patent. MRI LUMBAR SPINE FINDINGS Segmentation: Standard. Lowest well-formed disc space labeled L5-S1. Alignment: Chronic bilateral pars defects at L5 with associated 6 mm spondylolisthesis.  Alignment otherwise normal with preservation of the normal lumbar lordosis. Vertebrae: Vertebral body height maintained without acute or chronic fracture. Bone marrow signal intensity within normal limits. No worrisome osseous lesions. Degenerative endplate changes with Schmorl's node deformities and mild enhancement noted at the inferior endplates of L1 and L2. No other evidence for osteomyelitis discitis or septic arthritis. Conus medullaris and cauda equina: Conus extends to the T12-L1 level. Conus and cauda equina appear normal. No visible epidural abscess or other collection. Paraspinal and other soft tissues: No acute finding. Disc levels: Mild spondylosis at L1-2 and L2-3 without stenosis. L5-S1: Chronic bilateral pars defects with 6 mm spondylolisthesis. Associated broad posterior pseudo disc bulge/uncovering. No spinal stenosis. Mild bilateral L5 foraminal narrowing. IMPRESSION: 1. Motion degraded exam. Additionally, Please note that this is a limited sagittal only MRI for screening of possible epidural abscess or other spinal infection. No axial images were performed. 2. No MRI evidence for acute infection within the cervical, thoracic, or lumbar spine. No epidural abscess. 3. Chronic bilateral pars defects at L5 with associated 6 mm spondylolisthesis, with resultant mild bilateral L5 foraminal stenosis. 4. Otherwise, no other significant disc pathology within the cervical, thoracic, or lumbar spine for age. No other stenosis or impingement. Electronically Signed   By: Rise Mu M.D.   On: 11/29/2022 23:45   MR THORACIC SPINE W WO CONTRAST  Result Date: 11/29/2022 CLINICAL DATA:  Initial evaluation for concern for spinal epidural abscess. EXAM: MRI CERVICAL, THORACIC AND LUMBAR SPINE WITHOUT AND WITH CONTRAST TECHNIQUE: Multiplanar and multiecho pulse sequences of the cervical spine, to include the craniocervical junction and cervicothoracic junction, and thoracic and lumbar spine, were  obtained without and with intravenous contrast. CONTRAST:  10mL GADAVIST GADOBUTROL 1 MMOL/ML IV SOLN COMPARISON:  None Available. FINDINGS: MRI CERVICAL SPINE FINDINGS Alignment: Please note that this is a limited screening sagittal only MRI for screening of possible epidural abscess or other spinal infection. No axial images were  performed. Additionally, the provided sequences are degraded by motion artifact. Straightening of the normal cervical lordosis.  No listhesis. Vertebrae: Vertebral body height maintained without acute or chronic fracture. Bone marrow signal intensity within normal limits. No worrisome osseous lesions. No visible evidence for osteomyelitis discitis or septic arthritis. Cord: Grossly normal signal and morphology. No visible epidural abscess or other collection. No definite abnormal enhancement, although the postcontrast sequence is markedly degraded by motion. Posterior Fossa, vertebral arteries, paraspinal tissues: No acute finding. Disc levels: No significant cervical spondylosis for age. No spinal stenosis. Foramina appear grossly patent. MRI THORACIC SPINE FINDINGS Alignment: Physiologic with preservation of the normal thoracic kyphosis. No listhesis. Vertebrae: Vertebral body height maintained without acute or chronic fracture. No worrisome osseous lesions. No MRI evidence for osteomyelitis discitis or septic arthritis. Cord: Grossly normal signal and morphology on this motion degraded exam. No visible epidural abscess or other collection. No abnormal enhancement, although the postcontrast sequences are markedly degraded by motion. Paraspinal and other soft tissues: No acute finding. Disc levels: No significant disc pathology within the thoracic spine for age. No spinal stenosis. Foramina appear grossly patent. MRI LUMBAR SPINE FINDINGS Segmentation: Standard. Lowest well-formed disc space labeled L5-S1. Alignment: Chronic bilateral pars defects at L5 with associated 6 mm  spondylolisthesis. Alignment otherwise normal with preservation of the normal lumbar lordosis. Vertebrae: Vertebral body height maintained without acute or chronic fracture. Bone marrow signal intensity within normal limits. No worrisome osseous lesions. Degenerative endplate changes with Schmorl's node deformities and mild enhancement noted at the inferior endplates of L1 and L2. No other evidence for osteomyelitis discitis or septic arthritis. Conus medullaris and cauda equina: Conus extends to the T12-L1 level. Conus and cauda equina appear normal. No visible epidural abscess or other collection. Paraspinal and other soft tissues: No acute finding. Disc levels: Mild spondylosis at L1-2 and L2-3 without stenosis. L5-S1: Chronic bilateral pars defects with 6 mm spondylolisthesis. Associated broad posterior pseudo disc bulge/uncovering. No spinal stenosis. Mild bilateral L5 foraminal narrowing. IMPRESSION: 1. Motion degraded exam. Additionally, Please note that this is a limited sagittal only MRI for screening of possible epidural abscess or other spinal infection. No axial images were performed. 2. No MRI evidence for acute infection within the cervical, thoracic, or lumbar spine. No epidural abscess. 3. Chronic bilateral pars defects at L5 with associated 6 mm spondylolisthesis, with resultant mild bilateral L5 foraminal stenosis. 4. Otherwise, no other significant disc pathology within the cervical, thoracic, or lumbar spine for age. No other stenosis or impingement. Electronically Signed   By: Rise Mu M.D.   On: 11/29/2022 23:45   MR Cervical Spine W and Wo Contrast  Result Date: 11/29/2022 CLINICAL DATA:  Initial evaluation for concern for spinal epidural abscess. EXAM: MRI CERVICAL, THORACIC AND LUMBAR SPINE WITHOUT AND WITH CONTRAST TECHNIQUE: Multiplanar and multiecho pulse sequences of the cervical spine, to include the craniocervical junction and cervicothoracic junction, and thoracic and  lumbar spine, were obtained without and with intravenous contrast. CONTRAST:  10mL GADAVIST GADOBUTROL 1 MMOL/ML IV SOLN COMPARISON:  None Available. FINDINGS: MRI CERVICAL SPINE FINDINGS Alignment: Please note that this is a limited screening sagittal only MRI for screening of possible epidural abscess or other spinal infection. No axial images were performed. Additionally, the provided sequences are degraded by motion artifact. Straightening of the normal cervical lordosis.  No listhesis. Vertebrae: Vertebral body height maintained without acute or chronic fracture. Bone marrow signal intensity within normal limits. No worrisome osseous lesions. No visible evidence for osteomyelitis  discitis or septic arthritis. Cord: Grossly normal signal and morphology. No visible epidural abscess or other collection. No definite abnormal enhancement, although the postcontrast sequence is markedly degraded by motion. Posterior Fossa, vertebral arteries, paraspinal tissues: No acute finding. Disc levels: No significant cervical spondylosis for age. No spinal stenosis. Foramina appear grossly patent. MRI THORACIC SPINE FINDINGS Alignment: Physiologic with preservation of the normal thoracic kyphosis. No listhesis. Vertebrae: Vertebral body height maintained without acute or chronic fracture. No worrisome osseous lesions. No MRI evidence for osteomyelitis discitis or septic arthritis. Cord: Grossly normal signal and morphology on this motion degraded exam. No visible epidural abscess or other collection. No abnormal enhancement, although the postcontrast sequences are markedly degraded by motion. Paraspinal and other soft tissues: No acute finding. Disc levels: No significant disc pathology within the thoracic spine for age. No spinal stenosis. Foramina appear grossly patent. MRI LUMBAR SPINE FINDINGS Segmentation: Standard. Lowest well-formed disc space labeled L5-S1. Alignment: Chronic bilateral pars defects at L5 with associated  6 mm spondylolisthesis. Alignment otherwise normal with preservation of the normal lumbar lordosis. Vertebrae: Vertebral body height maintained without acute or chronic fracture. Bone marrow signal intensity within normal limits. No worrisome osseous lesions. Degenerative endplate changes with Schmorl's node deformities and mild enhancement noted at the inferior endplates of L1 and L2. No other evidence for osteomyelitis discitis or septic arthritis. Conus medullaris and cauda equina: Conus extends to the T12-L1 level. Conus and cauda equina appear normal. No visible epidural abscess or other collection. Paraspinal and other soft tissues: No acute finding. Disc levels: Mild spondylosis at L1-2 and L2-3 without stenosis. L5-S1: Chronic bilateral pars defects with 6 mm spondylolisthesis. Associated broad posterior pseudo disc bulge/uncovering. No spinal stenosis. Mild bilateral L5 foraminal narrowing. IMPRESSION: 1. Motion degraded exam. Additionally, Please note that this is a limited sagittal only MRI for screening of possible epidural abscess or other spinal infection. No axial images were performed. 2. No MRI evidence for acute infection within the cervical, thoracic, or lumbar spine. No epidural abscess. 3. Chronic bilateral pars defects at L5 with associated 6 mm spondylolisthesis, with resultant mild bilateral L5 foraminal stenosis. 4. Otherwise, no other significant disc pathology within the cervical, thoracic, or lumbar spine for age. No other stenosis or impingement. Electronically Signed   By: Rise Mu M.D.   On: 11/29/2022 23:45   DG Lumbar Spine Complete  Result Date: 11/29/2022 CLINICAL DATA:  Back pain.  Multiple falls. EXAM: LUMBAR SPINE - COMPLETE 4+ VIEW COMPARISON:  CT of the abdomen and pelvis 07/21/2021 FINDINGS: Grade 1 anterolisthesis at L5-S1 associated with bilateral L5 pars defects is stable. The vertebral body heights and alignment are otherwise normal. Mild endplate changes  are present at L1-2, L2-3 and L3-4 with some loss of disc height at each of these levels. Atherosclerotic calcifications are again noted in the aorta without aneurysm. IMPRESSION: 1. No acute abnormality. 2. Stable grade 1 anterolisthesis at L5-S1 associated with bilateral L5 pars defects. 3. Multilevel degenerative disc disease. 4. Aortic atherosclerosis. Electronically Signed   By: Marin Roberts M.D.   On: 11/29/2022 19:45   DG Chest 2 View  Result Date: 11/29/2022 CLINICAL DATA:  Fever.  Back pain.  Multiple recent falls. EXAM: CHEST - 2 VIEW COMPARISON:  One-view chest x-ray 07/21/2021 FINDINGS: The heart is enlarged. Moderate pulmonary vascular congestion is present. Chronic elevation of the right hemidiaphragm is noted. Mild bibasilar atelectasis is present. No other focal airspace disease is present. The visualized soft tissues are unremarkable. CABG again  noted. IMPRESSION: 1. Cardiomegaly and moderate pulmonary vascular congestion without frank edema. 2. Chronic elevation of the right hemidiaphragm. Electronically Signed   By: Marin Roberts M.D.   On: 11/29/2022 19:44    Assessment and Plan:   Acute respiratory failure with hypoxia Hypoxic to 80s requiring BiPAP and now on high flow Severe sepsis Urosepsis Acute heart failure with unknown EF proBNP 699.5 up from baseline in the 150-190 range Non-ST elevation myocardial infarction Troponin of 2083 Inferolateral ST depressions noted Atrial fibrillation with RVR Paroxysmal atrial fibrillation Coronary disease status post CABG History of TIA Primary hypertension Dyslipidemia Mild cognitive dysfunction   Plan: -Will redose Lasix 40 mg x 1 IV and will continue 40 IV twice daily.  Can initiate SGLT2 inhibitor as well. -Continue heparin drip.  Continue ACS therapies. -Will give digoxin for rate control -Once infectious process and heart failure are stabilized we will consider left heart catheterization.    45 minutes  of critical care discussing case with ER, hospital medicine, patient and family member.  For questions or updates, please contact [facility] Please consult [email or web address] for contact info under     Signed, Macie Burows, MD  11/30/2022 7:19 AM

## 2022-11-30 NOTE — ED Notes (Signed)
Echo tech at bedside.

## 2022-11-30 NOTE — Progress Notes (Signed)
PHARMACY - ANTICOAGULATION CONSULT NOTE  Pharmacy Consult for IV heparin  Indication: chest pain/ACS  No Known Allergies  Patient Measurements: Height: 5\' 9"  (175.3 cm) Weight: 99.8 kg (220 lb) IBW/kg (Calculated) : 70.7 Heparin Dosing Weight: 91.8 kg  Vital Signs: Temp: 98.8 F (37.1 C) (10/12 0542) Temp Source: Oral (10/12 0542) BP: 118/73 (10/12 0545) Pulse Rate: 105 (10/12 0545)  Labs: Recent Labs    11/29/22 1605 11/30/22 0433  HGB 14.2 12.4*  HCT 42.1 37.7*  PLT 154 145*  CREATININE 1.29* 1.49*  TROPONINIHS  --  2,083*    Estimated Creatinine Clearance: 46 mL/min (A) (by C-G formula based on SCr of 1.49 mg/dL (H)).   Medical History: Past Medical History:  Diagnosis Date   Benign hypertensive heart disease without heart failure    Coronary atherosclerosis of native coronary artery    S/P CABG   Dyslipidemia    HTN (hypertension)    Obesity    Sinus bradycardia    a. baseline HR 50s.   Assessment: Jeffery Barr is a 82 y.o. year old male admitted on 11/29/2022 with UTI and now concern for ACS. Troponin 2083 in ED. No anticoagulation prior to admission. Pharmacy consulted to dose heparin.   Goal of Therapy:  Heparin level 0.3-0.7 units/ml Monitor platelets by anticoagulation protocol: Yes   Plan:  Heparin 4000 units x 1 as bolus followed by heparin infusion at 1000 units/hr 8 heparin level  Daily heparin level, CBC, and monitoring for bleeding F/u plans for anticoagulation and cath  Thank you for allowing pharmacy to participate in this patient's care.  Marja Kays, PharmD Emergency Medicine Clinical Pharmacist 11/30/2022,6:06 AM

## 2022-11-30 NOTE — H&P (Addendum)
History and Physical    Rafik Gilley Tones XLK:[id number] DOB: Jun 25, 1940 DOA: 11/29/2022  PCP: [name], MD   Patient coming from:  Transfer ED-ED from [facility]    Chief Complaint:  Chief Complaint  Patient presents with   Back Pain   Weakness    HPI:  Jeffery Barr is a 82 y.o. male with hx of CAD with history CABG, A-fib not on anticoagulation, TIA, hypertension, hyperlipidemia, who was transferred ED to ED from [facility] due to initial concern for possible epidural abscess and for MRI.  Patient had presented with few day history of midline lower back pain, and fevers.  Per his wife has also had generalized weakness, possible rigoring, difficulty getting around.  Today slid out of bed but did not fall or hit his head or sustain other injuries.  He has no numbness or weakness in his lower extremities.  There is no bowel or bladder incontinence.  Does acknowledge recent dysuria.    Denies any history of recent or active chest pain (after his episode of arrhythmia /resp failure) in the ED. does acknowledge recent history of dyspnea on exertion.    Review of Systems:  ROS complete and negative except as marked above   No Known Allergies  Prior to Admission medications   Medication Sig Start Date End Date Taking? Authorizing Provider  amLODipine (NORVASC) 5 MG tablet Take 1 tablet (5 mg total) by mouth daily. 04/16/22  Yes Georgeanna Lea, MD  aspirin EC 81 MG tablet Take 81 mg by mouth daily. Swallow whole.   Yes [provider]  carvedilol (COREG) 6.25 MG tablet Take 1 tablet (6.25 mg total) by mouth 2 (two) times daily. Replacing the 3.125 mg 04/16/22  Yes Georgeanna Lea, MD  chlorthalidone (HYGROTON) 25 MG tablet Take 1 tablet (25 mg total) by mouth daily. 05/24/22  Yes Georgeanna Lea, MD  ezetimibe (ZETIA) 10 MG tablet Take 1 tablet (10 mg total) by mouth daily. 04/16/22  Yes Georgeanna Lea, MD  rosuvastatin (CRESTOR) 20 MG tablet Take 1  tablet (20 mg total) by mouth daily. 04/16/22  Yes Georgeanna Lea, MD    Past Medical History:  Diagnosis Date   Benign hypertensive heart disease without heart failure    Coronary atherosclerosis of native coronary artery    S/P CABG   Dyslipidemia    HTN (hypertension)    Obesity    Sinus bradycardia    a. baseline HR 50s.    Past Surgical History:  Procedure Laterality Date   CORONARY ARTERY BYPASS GRAFT     VASECTOMY       reports that he has quit smoking. He has never used smokeless tobacco. He reports current alcohol use of about 3.0 - 5.0 standard drinks of alcohol per week. He reports that he does not use drugs.  Family History  Problem Relation Age of Onset   Heart failure Mother    CAD Mother    Heart disease Brother    CAD Sister    CAD Other    Heart attack Other      Physical Exam: Vitals:   11/30/22 0500 11/30/22 0530 11/30/22 0542 11/30/22 0545  BP: 111/75 101/72  118/73  Pulse: 86 91  (!) 105  Resp: (!) 27 (!) 26  (!) 27  Temp:   98.8 F (37.1 C)   TempSrc:   Oral   SpO2: 99% 99%  95%  Weight:  Height:        Gen: Awake, alert, chronically ill-appearing.  CV: tachycardic, irregular, normal S1, S2, 1/6 SEM Resp: Improved work of breathing on BiPAP, and stable after transition to nasal cannula.  Rales in the bases Abd: Round slightly distended, normoactive, nontender MSK: Symmetric, 1+ pitting edema below the knees Skin: No rashes or lesions to exposed skin  Neuro: Alert and interactive  Psych: euthymic, appropriate    Data review:   Labs reviewed, notable for:   Following episode of tachyarrhythmia, respiratory failure requiring BiPAP in the ED -> troponin 2083  BNP 699 Lactate 2, down to 1 CRP 22, ESR 85  UA with bacteria, pyuria, granular casts, + ketones   Micro:  Results for orders placed or performed during the hospital encounter of 11/29/22  Resp panel by RT-PCR (RSV, Flu A&B, Covid)     Status: None   Collection  Time: 11/29/22  4:26 PM   Specimen: Nasal Swab  Result Value Ref Range Status   SARS Coronavirus 2 by RT PCR NEGATIVE NEGATIVE Final    Comment: (NOTE) SARS-CoV-2 target nucleic acids are NOT DETECTED.  The SARS-CoV-2 RNA is generally detectable in upper respiratory specimens during the acute phase of infection. The lowest concentration of SARS-CoV-2 viral copies this assay can detect is 138 copies/mL. A negative result does not preclude SARS-Cov-2 infection and should not be used as the sole basis for treatment or other patient management decisions. A negative result may occur with  improper specimen collection/handling, submission of specimen other than nasopharyngeal swab, presence of viral mutation(s) within the areas targeted by this assay, and inadequate number of viral copies(<138 copies/mL). A negative result must be combined with clinical observations, patient history, and epidemiological information. The expected result is Negative.  Fact Sheet for Patients:  BloggerCourse.com  Fact Sheet for Healthcare Providers:  SeriousBroker.it  This test is no t yet approved or cleared by the [place] FDA and  has been authorized for detection and/or diagnosis of SARS-CoV-2 by FDA under an Emergency Use Authorization (EUA). This EUA will remain  in effect (meaning this test can be used) for the duration of the COVID-19 declaration under Section 564(b)(1) of the Act, 21 U.S.C.section 360bbb-3(b)(1), unless the authorization is terminated  or revoked sooner.       Influenza A by PCR NEGATIVE NEGATIVE Final   Influenza B by PCR NEGATIVE NEGATIVE Final    Comment: (NOTE) The Xpert Xpress SARS-CoV-2/FLU/RSV plus assay is intended as an aid in the diagnosis of influenza from Nasopharyngeal swab specimens and should not be used as a sole basis for treatment. Nasal washings and aspirates are unacceptable for Xpert Xpress  SARS-CoV-2/FLU/RSV testing.  Fact Sheet for Patients: BloggerCourse.com  Fact Sheet for Healthcare Providers: SeriousBroker.it  This test is not yet approved or cleared by the [place] FDA and has been authorized for detection and/or diagnosis of SARS-CoV-2 by FDA under an Emergency Use Authorization (EUA). This EUA will remain in effect (meaning this test can be used) for the duration of the COVID-19 declaration under Section 564(b)(1) of the Act, 21 U.S.C. section 360bbb-3(b)(1), unless the authorization is terminated or revoked.     Resp Syncytial Virus by PCR NEGATIVE NEGATIVE Final    Comment: (NOTE) Fact Sheet for Patients: BloggerCourse.com  Fact Sheet for Healthcare Providers: SeriousBroker.it  This test is not yet approved or cleared by the [place] FDA and has been authorized for detection and/or diagnosis of SARS-CoV-2 by FDA under an Emergency Use  Authorization (EUA). This EUA will remain in effect (meaning this test can be used) for the duration of the COVID-19 declaration under Section 564(b)(1) of the Act, 21 U.S.C. section 360bbb-3(b)(1), unless the authorization is terminated or revoked.  Performed at [facility], [address] [place], [place] [address]   SARS Coronavirus 2 by RT PCR (hospital order, performed in [facility] hospital lab) *cepheid single result test* Anterior Nasal Swab     Status: None   Collection Time: 11/30/22 12:03 AM   Specimen: Anterior Nasal Swab  Result Value Ref Range Status   SARS Coronavirus 2 by RT PCR NEGATIVE NEGATIVE Final    Comment: Performed at [facility], [address], [place], [place] [address]    Imaging reviewed:  [facility] Chest Port 1 View  Result Date: 11/30/2022 CLINICAL DATA:  Shortness of breath. EXAM: PORTABLE CHEST 1 VIEW COMPARISON:  November 29, 2022 FINDINGS: Multiple sternal  wires are noted. The cardiac silhouette is borderline in size and stable in appearance. Low lung volumes are seen with chronic, moderate severity elevation of the right hemidiaphragm. Mild diffusely increased interstitial lung markings are noted with stable prominence of the pulmonary vasculature. Mild bibasilar atelectasis is seen, right greater than left. No pleural effusion or pneumothorax is identified. Multilevel degenerative changes are seen throughout the thoracic spine. IMPRESSION: 1. Stable pulmonary vascular congestion with a mild component of suspected interstitial edema. 2. Chronic elevation of the right hemidiaphragm with mild bibasilar atelectasis, right greater than left. Electronically Signed   By: Aram Candela M.D.   On: 11/30/2022 02:07   DG Skull 1-3 Views  Result Date: 11/29/2022 CLINICAL DATA:  Foreign body in auricle.  MRI clearance. EXAM: SKULL - 1-3 VIEW COMPARISON:  None Available. FINDINGS: There is no evidence of skull fracture or other focal bone lesions. No radiopaque foreign body is seen. IMPRESSION: Negative. Electronically Signed   By: Thornell Sartorius M.D.   On: 11/29/2022 23:48   MR Lumbar Spine W Wo Contrast  Result Date: 11/29/2022 CLINICAL DATA:  Initial evaluation for concern for spinal epidural abscess. EXAM: MRI CERVICAL, THORACIC AND LUMBAR SPINE WITHOUT AND WITH CONTRAST TECHNIQUE: Multiplanar and multiecho pulse sequences of the cervical spine, to include the craniocervical junction and cervicothoracic junction, and thoracic and lumbar spine, were obtained without and with intravenous contrast. CONTRAST:  10mL GADAVIST GADOBUTROL 1 MMOL/ML IV SOLN COMPARISON:  None Available. FINDINGS: MRI CERVICAL SPINE FINDINGS Alignment: Please note that this is a limited screening sagittal only MRI for screening of possible epidural abscess or other spinal infection. No axial images were performed. Additionally, the provided sequences are degraded by motion artifact.  Straightening of the normal cervical lordosis.  No listhesis. Vertebrae: Vertebral body height maintained without acute or chronic fracture. Bone marrow signal intensity within normal limits. No worrisome osseous lesions. No visible evidence for osteomyelitis discitis or septic arthritis. Cord: Grossly normal signal and morphology. No visible epidural abscess or other collection. No definite abnormal enhancement, although the postcontrast sequence is markedly degraded by motion. Posterior Fossa, vertebral arteries, paraspinal tissues: No acute finding. Disc levels: No significant cervical spondylosis for age. No spinal stenosis. Foramina appear grossly patent. MRI THORACIC SPINE FINDINGS Alignment: Physiologic with preservation of the normal thoracic kyphosis. No listhesis. Vertebrae: Vertebral body height maintained without acute or chronic fracture. No worrisome osseous lesions. No MRI evidence for osteomyelitis discitis or septic arthritis. Cord: Grossly normal signal and morphology on this motion degraded exam. No visible epidural abscess  or other collection. No abnormal enhancement, although the postcontrast sequences are markedly degraded by motion. Paraspinal and other soft tissues: No acute finding. Disc levels: No significant disc pathology within the thoracic spine for age. No spinal stenosis. Foramina appear grossly patent. MRI LUMBAR SPINE FINDINGS Segmentation: Standard. Lowest well-formed disc space labeled L5-S1. Alignment: Chronic bilateral pars defects at L5 with associated 6 mm spondylolisthesis. Alignment otherwise normal with preservation of the normal lumbar lordosis. Vertebrae: Vertebral body height maintained without acute or chronic fracture. Bone marrow signal intensity within normal limits. No worrisome osseous lesions. Degenerative endplate changes with Schmorl's node deformities and mild enhancement noted at the inferior endplates of L1 and L2. No other evidence for osteomyelitis discitis  or septic arthritis. Conus medullaris and cauda equina: Conus extends to the T12-L1 level. Conus and cauda equina appear normal. No visible epidural abscess or other collection. Paraspinal and other soft tissues: No acute finding. Disc levels: Mild spondylosis at L1-2 and L2-3 without stenosis. L5-S1: Chronic bilateral pars defects with 6 mm spondylolisthesis. Associated broad posterior pseudo disc bulge/uncovering. No spinal stenosis. Mild bilateral L5 foraminal narrowing. IMPRESSION: 1. Motion degraded exam. Additionally, Please note that this is a limited sagittal only MRI for screening of possible epidural abscess or other spinal infection. No axial images were performed. 2. No MRI evidence for acute infection within the cervical, thoracic, or lumbar spine. No epidural abscess. 3. Chronic bilateral pars defects at L5 with associated 6 mm spondylolisthesis, with resultant mild bilateral L5 foraminal stenosis. 4. Otherwise, no other significant disc pathology within the cervical, thoracic, or lumbar spine for age. No other stenosis or impingement. Electronically Signed   By: Rise Mu M.D.   On: 11/29/2022 23:45   MR THORACIC SPINE W WO CONTRAST  Result Date: 11/29/2022 CLINICAL DATA:  Initial evaluation for concern for spinal epidural abscess. EXAM: MRI CERVICAL, THORACIC AND LUMBAR SPINE WITHOUT AND WITH CONTRAST TECHNIQUE: Multiplanar and multiecho pulse sequences of the cervical spine, to include the craniocervical junction and cervicothoracic junction, and thoracic and lumbar spine, were obtained without and with intravenous contrast. CONTRAST:  10mL GADAVIST GADOBUTROL 1 MMOL/ML IV SOLN COMPARISON:  None Available. FINDINGS: MRI CERVICAL SPINE FINDINGS Alignment: Please note that this is a limited screening sagittal only MRI for screening of possible epidural abscess or other spinal infection. No axial images were performed. Additionally, the provided sequences are degraded by motion  artifact. Straightening of the normal cervical lordosis.  No listhesis. Vertebrae: Vertebral body height maintained without acute or chronic fracture. Bone marrow signal intensity within normal limits. No worrisome osseous lesions. No visible evidence for osteomyelitis discitis or septic arthritis. Cord: Grossly normal signal and morphology. No visible epidural abscess or other collection. No definite abnormal enhancement, although the postcontrast sequence is markedly degraded by motion. Posterior Fossa, vertebral arteries, paraspinal tissues: No acute finding. Disc levels: No significant cervical spondylosis for age. No spinal stenosis. Foramina appear grossly patent. MRI THORACIC SPINE FINDINGS Alignment: Physiologic with preservation of the normal thoracic kyphosis. No listhesis. Vertebrae: Vertebral body height maintained without acute or chronic fracture. No worrisome osseous lesions. No MRI evidence for osteomyelitis discitis or septic arthritis. Cord: Grossly normal signal and morphology on this motion degraded exam. No visible epidural abscess or other collection. No abnormal enhancement, although the postcontrast sequences are markedly degraded by motion. Paraspinal and other soft tissues: No acute finding. Disc levels: No significant disc pathology within the thoracic spine for age. No spinal stenosis. Foramina appear grossly patent. MRI LUMBAR SPINE FINDINGS  Segmentation: Standard. Lowest well-formed disc space labeled L5-S1. Alignment: Chronic bilateral pars defects at L5 with associated 6 mm spondylolisthesis. Alignment otherwise normal with preservation of the normal lumbar lordosis. Vertebrae: Vertebral body height maintained without acute or chronic fracture. Bone marrow signal intensity within normal limits. No worrisome osseous lesions. Degenerative endplate changes with Schmorl's node deformities and mild enhancement noted at the inferior endplates of L1 and L2. No other evidence for  osteomyelitis discitis or septic arthritis. Conus medullaris and cauda equina: Conus extends to the T12-L1 level. Conus and cauda equina appear normal. No visible epidural abscess or other collection. Paraspinal and other soft tissues: No acute finding. Disc levels: Mild spondylosis at L1-2 and L2-3 without stenosis. L5-S1: Chronic bilateral pars defects with 6 mm spondylolisthesis. Associated broad posterior pseudo disc bulge/uncovering. No spinal stenosis. Mild bilateral L5 foraminal narrowing. IMPRESSION: 1. Motion degraded exam. Additionally, Please note that this is a limited sagittal only MRI for screening of possible epidural abscess or other spinal infection. No axial images were performed. 2. No MRI evidence for acute infection within the cervical, thoracic, or lumbar spine. No epidural abscess. 3. Chronic bilateral pars defects at L5 with associated 6 mm spondylolisthesis, with resultant mild bilateral L5 foraminal stenosis. 4. Otherwise, no other significant disc pathology within the cervical, thoracic, or lumbar spine for age. No other stenosis or impingement. Electronically Signed   By: Rise Mu M.D.   On: 11/29/2022 23:45   MR Cervical Spine W and Wo Contrast  Result Date: 11/29/2022 CLINICAL DATA:  Initial evaluation for concern for spinal epidural abscess. EXAM: MRI CERVICAL, THORACIC AND LUMBAR SPINE WITHOUT AND WITH CONTRAST TECHNIQUE: Multiplanar and multiecho pulse sequences of the cervical spine, to include the craniocervical junction and cervicothoracic junction, and thoracic and lumbar spine, were obtained without and with intravenous contrast. CONTRAST:  10mL GADAVIST GADOBUTROL 1 MMOL/ML IV SOLN COMPARISON:  None Available. FINDINGS: MRI CERVICAL SPINE FINDINGS Alignment: Please note that this is a limited screening sagittal only MRI for screening of possible epidural abscess or other spinal infection. No axial images were performed. Additionally, the provided sequences are  degraded by motion artifact. Straightening of the normal cervical lordosis.  No listhesis. Vertebrae: Vertebral body height maintained without acute or chronic fracture. Bone marrow signal intensity within normal limits. No worrisome osseous lesions. No visible evidence for osteomyelitis discitis or septic arthritis. Cord: Grossly normal signal and morphology. No visible epidural abscess or other collection. No definite abnormal enhancement, although the postcontrast sequence is markedly degraded by motion. Posterior Fossa, vertebral arteries, paraspinal tissues: No acute finding. Disc levels: No significant cervical spondylosis for age. No spinal stenosis. Foramina appear grossly patent. MRI THORACIC SPINE FINDINGS Alignment: Physiologic with preservation of the normal thoracic kyphosis. No listhesis. Vertebrae: Vertebral body height maintained without acute or chronic fracture. No worrisome osseous lesions. No MRI evidence for osteomyelitis discitis or septic arthritis. Cord: Grossly normal signal and morphology on this motion degraded exam. No visible epidural abscess or other collection. No abnormal enhancement, although the postcontrast sequences are markedly degraded by motion. Paraspinal and other soft tissues: No acute finding. Disc levels: No significant disc pathology within the thoracic spine for age. No spinal stenosis. Foramina appear grossly patent. MRI LUMBAR SPINE FINDINGS Segmentation: Standard. Lowest well-formed disc space labeled L5-S1. Alignment: Chronic bilateral pars defects at L5 with associated 6 mm spondylolisthesis. Alignment otherwise normal with preservation of the normal lumbar lordosis. Vertebrae: Vertebral body height maintained without acute or chronic fracture. Bone marrow signal intensity within  normal limits. No worrisome osseous lesions. Degenerative endplate changes with Schmorl's node deformities and mild enhancement noted at the inferior endplates of L1 and L2. No other  evidence for osteomyelitis discitis or septic arthritis. Conus medullaris and cauda equina: Conus extends to the T12-L1 level. Conus and cauda equina appear normal. No visible epidural abscess or other collection. Paraspinal and other soft tissues: No acute finding. Disc levels: Mild spondylosis at L1-2 and L2-3 without stenosis. L5-S1: Chronic bilateral pars defects with 6 mm spondylolisthesis. Associated broad posterior pseudo disc bulge/uncovering. No spinal stenosis. Mild bilateral L5 foraminal narrowing. IMPRESSION: 1. Motion degraded exam. Additionally, Please note that this is a limited sagittal only MRI for screening of possible epidural abscess or other spinal infection. No axial images were performed. 2. No MRI evidence for acute infection within the cervical, thoracic, or lumbar spine. No epidural abscess. 3. Chronic bilateral pars defects at L5 with associated 6 mm spondylolisthesis, with resultant mild bilateral L5 foraminal stenosis. 4. Otherwise, no other significant disc pathology within the cervical, thoracic, or lumbar spine for age. No other stenosis or impingement. Electronically Signed   By: Rise Mu M.D.   On: 11/29/2022 23:45   DG Lumbar Spine Complete  Result Date: 11/29/2022 CLINICAL DATA:  Back pain.  Multiple falls. EXAM: LUMBAR SPINE - COMPLETE 4+ VIEW COMPARISON:  CT of the abdomen and pelvis 07/21/2021 FINDINGS: Grade 1 anterolisthesis at L5-S1 associated with bilateral L5 pars defects is stable. The vertebral body heights and alignment are otherwise normal. Mild endplate changes are present at L1-2, L2-3 and L3-4 with some loss of disc height at each of these levels. Atherosclerotic calcifications are again noted in the aorta without aneurysm. IMPRESSION: 1. No acute abnormality. 2. Stable grade 1 anterolisthesis at L5-S1 associated with bilateral L5 pars defects. 3. Multilevel degenerative disc disease. 4. Aortic atherosclerosis. Electronically Signed   By:  Marin Roberts M.D.   On: 11/29/2022 19:45   DG Chest 2 View  Result Date: 11/29/2022 CLINICAL DATA:  Fever.  Back pain.  Multiple recent falls. EXAM: CHEST - 2 VIEW COMPARISON:  One-view chest x-ray 07/21/2021 FINDINGS: The heart is enlarged. Moderate pulmonary vascular congestion is present. Chronic elevation of the right hemidiaphragm is noted. Mild bibasilar atelectasis is present. No other focal airspace disease is present. The visualized soft tissues are unremarkable. CABG again noted. IMPRESSION: 1. Cardiomegaly and moderate pulmonary vascular congestion without frank edema. 2. Chronic elevation of the right hemidiaphragm. Electronically Signed   By: Marin Roberts M.D.   On: 11/29/2022 19:44    EKG:  Rhythm possible MAT versus A-fib, rate 125, diffuse ST depressions  ED Course:  [facility] treated with metoprolol to 5 mg IV x 1 for suspected A-fib RVR, vancomycin, cefepime, Flagyl.  He had initially stabilized here but then had an episode of tachyarrhythmia up to the 140s, and acute respiratory failure was on 4 L but placed on BiPAP due to work of breathing.  Treated with Lasix 20 mg IV x 1, Tylenol suppository.  Stabilized ultimately transition to nasal cannula.  Following this episode troponin found to be markedly elevated, cardiology consulted  Assessment/Plan:  82 y.o. male with hx CAD with history CABG, A-fib not on anticoagulation, TIA, hypertension, hyperlipidemia, who was transferred ED to ED from [place] [facility] due to initial concern for possible epidural abscess and for MRI, and continued treatment for urosepsis. MRI negative for epidural abscess. ED course c/b tachyarrhythmia and respiratory failure requiring rescue BiPAP, and  found to have NSTEMI.   Non-STEMI Acute heart failure, unknown type  Hx of CAD with CABG  EKG with MAT versus A-fib with ischemic changes, diffuse ST depressions.  Troponin markedly elevated to 2068; drawn after episode of  respiratory failure requiring BiPAP and tachyarrhythmia. Unknown if type 1 v 2 event. Associated has acute pulm edema / resp failure per below, likely acute heart failure. per cards note, no prior history of heart failure; Has changes of HTN with LVH, but LVEF previously normal 60-65% and normal diastolic function.  -Loaded with aspirin 324 mg x 1  -Started on heparin drip -Cardiology consulted, will see the patient overnight, agree with heparin drip, aspirin -Repeat troponin at 3-hour -Check TTE -Increase rosuvastatin to 40 mg, continue Zetia -Check lipids, A1c  Complicated urinary tract infection, suspected pyelonephritis Sepsis secondary to above, improving History of recent dysuria, UA appears with infection. Question if back pain may be referred pain from pyelonephritis, although had no CVA tenderness.  -Follow-up blood cultures, urine culture -Continue vancomycin pharmacy to dose, cefepime 2 g IV every 8 hour -If persistently febrile or failure to improve, obtain CT abdomen pelvis  Acute hypoxic respiratory failure  Acute respiratory distress associated with tachyarrhythmia in ED. acutely desaturated to mid 80's%, rales on exam chest x-ray with worsening interstitial edema.  Was placed on rescue BiPAP and diuresis with Lasix 20 mg IV.  Subsequently stabilized and transition back to nasal cannula with no further hypoxic episode. Suspect acute pulm edema in setting of volume resuscitation + tachyarrhythmia / ACS / acute HF contributing  -Transitioned to nasal cannula, keep sat > 94% with ACS  - s/p lasix 20 mg IV, redose 40 mg IV x 1  -albuterol neb prn, IS,  Tachyarrhythmia - ? MAT v Afib  Previous hx of paroxysmal Afib during episode of COVID, previously on [facility] but could not afford. On EKG has tachyarrhythmia here, but appears may have organize P wave activity with varying morphology, possible MAT v Afib.  - Currently anticoagulated for ACS above  - Continued home carvedilol for rate  control   Midline back pain, initial concern for epidural abscess ruled out by MRI CTL spine; marrow signal was within normal limits no lesions, no evidence of osteomyelitis or discitis, no evidence of epidural abscess.  Note that postcontrast sequence was degraded by motion.  -Symptomatic management, Tylenol prn   Hypertension: Hold home amlodipine in setting of sepsis.  Continuing carvedilol per above  HLD: see non-STEMI above  Body mass index is 32.49 kg/m.  Obesity class I affecting medical care per above  DVT prophylaxis:  IV heparin gtts Code Status:  Full Code Diet:  Diet Orders (From admission, onward)     Start     Ordered   11/30/22 0353  Diet NPO time specified Except for: Sips with Meds  Diet effective now       Question:  Except for  Answer:  Clearance Coots with Meds   11/30/22 0355           Family Communication: Yes discussed with his wife at bedside Consults: Cardiology Admission status:   Inpatient, Step Down Unit  Severity of Illness: The appropriate patient status for this patient is INPATIENT. Inpatient status is judged to be reasonable and necessary in order to provide the required intensity of service to ensure the patient's safety. The patient's presenting symptoms, physical exam findings, and initial radiographic and laboratory data in the context of their chronic comorbidities is felt to place them  at high risk for further clinical deterioration. Furthermore, it is not anticipated that the patient will be medically stable for discharge from the hospital within 2 midnights of admission.   * I certify that at the point of admission it is my clinical judgment that the patient will require inpatient hospital care spanning beyond 2 midnights from the point of admission due to high intensity of service, high risk for further deterioration and high frequency of surveillance required.*   Dolly Rias, MD [facility]  How to contact the [facility] Attending or  Consulting provider 7A - 7P or covering provider during after hours 7P -7A, for this patient.  Check the care team in [facility] and look for a) attending/consulting [facility] provider listed and b) the [facility] team listed Log into [email or web address] and use [place]'s universal password to access. If you do not have the password, please contact the hospital operator. Locate the [facility] provider you are looking for under [facility] and page to a number that you can be directly reached. If you still have difficulty reaching the provider, please page the [facility] (Director on Call) for the Hospitalists listed on amion for assistance.  11/30/2022, 6:41 AM

## 2022-11-30 NOTE — Progress Notes (Addendum)
Pharmacy Antibiotic Note  Jeffery Barr is a 82 y.o. male admitted on 11/29/2022 with sepsis 2/2 possible urinary source. Initial concern for epidural abscess and [facility]. MRI 10/11 is negative for abscess. WBC wnl, LA wnl, Tmax 102.5. Pharmacy has been consulted for vancomycin dosing.  Plan: Vancomycin 1250mg  q24h (eAUC 467, Scr 1.49) F/u renal function, micro data for ability to narrow and vancomycin levels as needed  Height: 5\' 9"  (175.3 cm) Weight: 99.8 kg (220 lb) IBW/kg (Calculated) : 70.7  Temp (24hrs), Avg:100.2 F (37.9 C), Min:98.8 F (37.1 C), Max:102.5 F (39.2 C)  Recent Labs  Lab 11/29/22 1605 11/29/22 1805 11/30/22 0433  WBC 9.8  --  10.1  CREATININE 1.29*  --  1.49*  LATICACIDVEN 2.0* 1.0  --     Estimated Creatinine Clearance: 46 mL/min (A) (by C-G formula based on SCr of 1.49 mg/dL (H)).    No Known Allergies  Antimicrobials this admission: Vancomycin 10/11 > Flagyl 10/11 > Cefepime 10/11 >  Microbiology results: 10/11 covid: neg 10/11 ucx: pending 10/11 bcx: pending 10/11 resp panel: negative  Thank you for allowing pharmacy to be a part of this patient's care.  Marja Kays 11/30/2022 6:58 AM ------------------  Addendum: Vancomycin discontinued.   Thank you for allowing pharmacy to participate in this patient's care.  Marja Kays, PharmD Emergency Medicine Clinical Pharmacist 11/30/2022,9:01 AM

## 2022-11-30 NOTE — Progress Notes (Signed)
ANTICOAGULATION CONSULT NOTE - Follow-up Note  Pharmacy Consult for Heparin Indication: chest pain/ACS  No Known Allergies  Patient Measurements: Height: 5\' 9"  (175.3 cm) Weight: 99.8 kg (220 lb) IBW/kg (Calculated) : 70.7 Heparin Dosing Weight: 91.8 kg  Vital Signs: Temp: 98.8 F (37.1 C) (10/12 1357) Temp Source: Oral (10/12 1357) BP: 169/71 (10/12 1500) Pulse Rate: 82 (10/12 1500)  Labs: Recent Labs    11/29/22 1605 11/30/22 0433 11/30/22 0844 11/30/22 1040 11/30/22 1700  HGB 14.2 12.4*  --   --   --   HCT 42.1 37.7*  --   --   --   PLT 154 145*  --   --   --   HEPARINUNFRC  --   --   --   --  0.18*  CREATININE 1.29* 1.49*  --   --   --   TROPONINIHS  --  2,083* 2,564* 2,041*  --     Estimated Creatinine Clearance: 46 mL/min (A) (by C-G formula based on SCr of 1.49 mg/dL (H)).   Medical History: Past Medical History:  Diagnosis Date   Benign hypertensive heart disease without heart failure    Coronary atherosclerosis of native coronary artery    S/P CABG   Dyslipidemia    HTN (hypertension)    Obesity    Sinus bradycardia    a. baseline HR 50s.    Medications:  (Not in a hospital admission)  Scheduled:   carvedilol  6.25 mg Oral BID   ezetimibe  10 mg Oral Daily   rosuvastatin  40 mg Oral Daily   sodium chloride flush  3 mL Intravenous Q12H   Infusions:   cefTRIAXone (ROCEPHIN)  IV 1 g (11/30/22 1701)   heparin 1,000 Units/hr (11/30/22 1519)   PRN: acetaminophen, albuterol  Assessment: 82 yom with a history of CAD w/ hx of CABG, AF, TIA, HLD. Heparin per pharmacy consult placed for chest pain/ACS.  Patient was not on anticoagulation prior to arrival. Started on 1000 units/hr IV heparin following 4000 unit IV heparin bolus. Resulting heparin level is 0.18 which is sub-therapeutic.  No issues with infusion or bleeding per RN.  Hgb 12.4; plt 145  Goal of Therapy:  Heparin level 0.3-0.7 units/ml Monitor platelets by anticoagulation  protocol: Yes   Plan:  Give IV heparin 2500 units bolus x 1 Increase heparin infusion to 1250 units/hr Check anti-Xa level at 0200 and daily while on heparin Continue to monitor H&H and platelets  Delmar Landau, PharmD, BCPS 11/30/2022 5:43 PM ED Clinical Pharmacist -  [phone]

## 2022-11-30 NOTE — ED Notes (Signed)
Pt fall risk precaution is in place

## 2022-11-30 NOTE — Progress Notes (Signed)
PHARMACY NOTE:  ANTIMICROBIAL RENAL DOSAGE ADJUSTMENT  Current antimicrobial regimen includes a mismatch between antimicrobial dosage and estimated renal function.  As per policy approved by the Pharmacy & Therapeutics and Medical Executive Committees, the antimicrobial dosage will be adjusted accordingly.  Current antimicrobial dosage:  Cefepime 2g IV Q8H  Indication: UTI/sepsis  Renal Function:  Estimated Creatinine Clearance: 53.2 mL/min (A) (by C-G formula based on SCr of 1.29 mg/dL (H)). []      On intermittent HD, scheduled: []      On CRRT    Antimicrobial dosage has been changed to:  Cefepime 2g IV Q12H   Thank you for allowing pharmacy to be a part of this patient's care.  Vernard Gambles, PharmD, BCPS  11/30/2022 4:09 AM

## 2022-11-30 NOTE — Progress Notes (Signed)
Rounding Note    Patient Name: Jeffery Barr Date of Encounter: 11/30/2022  [facility] Cardiologist: Armanda Magic, MD    Subjective   Pt appears very stable .  No cardiac concerns    Remains in atrial fib with controlled. V response   Inpatient Medications    Scheduled Meds:  carvedilol  6.25 mg Oral BID   ezetimibe  10 mg Oral Daily   rosuvastatin  40 mg Oral Daily   sodium chloride flush  3 mL Intravenous Q12H   Continuous Infusions:  cefTRIAXone (ROCEPHIN)  IV     heparin 1,000 Units/hr (11/30/22 0721)   PRN Meds: acetaminophen, albuterol, perflutren lipid microspheres (DEFINITY) IV suspension   Vital Signs    Vitals:   11/30/22 1030 11/30/22 1100 11/30/22 1300 11/30/22 1315  BP:  (!) 163/75 (!) 155/84   Pulse:  76 89 72  Resp:   (!) 25   Temp: 99.3 F (37.4 C)     TempSrc: Oral     SpO2:  96%  95%  Weight:      Height:        Intake/Output Summary (Last 24 hours) at 11/30/2022 1322 Last data filed at 11/30/2022 0327 Gross per 24 hour  Intake 1539.86 ml  Output --  Net 1539.86 ml      11/29/2022    3:47 PM 11/26/2022   10:09 PM 06/20/2022    1:29 PM  Last 3 Weights  Weight (lbs) 220 lb 220 lb 220 lb 6.4 oz  Weight (kg) 99.791 kg 99.791 kg 99.973 kg      Telemetry    Atrial fib , controlled V response - Personally Reviewed  ECG     - Personally Reviewed  Physical Exam   GEN: No acute distress.   Neck: No JVD Cardiac: irreg. Irreg.   Respiratory: Clear to auscultation bilaterally. GI: Soft, nontender, non-distended  MS: No edema; No deformity. Neuro:  Nonfocal  Psych: Normal affect   Labs    High Sensitivity Troponin:   Recent Labs  Lab 11/30/22 0433 11/30/22 0844 11/30/22 1040  TROPONINIHS 2,083* 2,564* 2,041*     Chemistry Recent Labs  Lab 11/29/22 1605 11/30/22 0433  NA 135 136  K 3.7 3.7  CL 98 105  CO2 21* 20*  GLUCOSE 134* 146*  BUN 19 18  CREATININE 1.29* 1.49*  CALCIUM 8.9 8.2*  MG  --   1.9  PROT 7.3  --   ALBUMIN 3.5  --   AST 24  --   ALT 16  --   ALKPHOS 53  --   BILITOT 1.1  --   GFRNONAA 56* 47*  ANIONGAP 16* 11    Lipids  Recent Labs  Lab 11/30/22 0844  CHOL 135  TRIG 65  HDL 38*  LDLCALC 84  CHOLHDL 3.6    Hematology Recent Labs  Lab 11/29/22 1605 11/30/22 0433  WBC 9.8 10.1  RBC 4.90 4.26  HGB 14.2 12.4*  HCT 42.1 37.7*  MCV 85.9 88.5  MCH 29.0 29.1  MCHC 33.7 32.9  RDW 13.3 13.8  PLT 154 145*   Thyroid No results for input(s): "TSH", "FREET4" in the last 168 hours.  BNP Recent Labs  Lab 11/29/22 1612  BNP 699.5*    DDimer No results for input(s): "DDIMER" in the last 168 hours.   Radiology    DG Chest Port 1 View  Result Date: 11/30/2022 CLINICAL DATA:  Shortness of breath. EXAM: PORTABLE CHEST 1 VIEW  COMPARISON:  November 29, 2022 FINDINGS: Multiple sternal wires are noted. The cardiac silhouette is borderline in size and stable in appearance. Low lung volumes are seen with chronic, moderate severity elevation of the right hemidiaphragm. Mild diffusely increased interstitial lung markings are noted with stable prominence of the pulmonary vasculature. Mild bibasilar atelectasis is seen, right greater than left. No pleural effusion or pneumothorax is identified. Multilevel degenerative changes are seen throughout the thoracic spine. IMPRESSION: 1. Stable pulmonary vascular congestion with a mild component of suspected interstitial edema. 2. Chronic elevation of the right hemidiaphragm with mild bibasilar atelectasis, right greater than left. Electronically Signed   By: Aram Candela M.D.   On: 11/30/2022 02:07   DG Skull 1-3 Views  Result Date: 11/29/2022 CLINICAL DATA:  Foreign body in auricle.  MRI clearance. EXAM: SKULL - 1-3 VIEW COMPARISON:  None Available. FINDINGS: There is no evidence of skull fracture or other focal bone lesions. No radiopaque foreign body is seen. IMPRESSION: Negative. Electronically Signed   By: Thornell Sartorius M.D.   On: 11/29/2022 23:48   MR Lumbar Spine W Wo Contrast  Result Date: 11/29/2022 CLINICAL DATA:  Initial evaluation for concern for spinal epidural abscess. EXAM: MRI CERVICAL, THORACIC AND LUMBAR SPINE WITHOUT AND WITH CONTRAST TECHNIQUE: Multiplanar and multiecho pulse sequences of the cervical spine, to include the craniocervical junction and cervicothoracic junction, and thoracic and lumbar spine, were obtained without and with intravenous contrast. CONTRAST:  10mL GADAVIST GADOBUTROL 1 MMOL/ML IV SOLN COMPARISON:  None Available. FINDINGS: MRI CERVICAL SPINE FINDINGS Alignment: Please note that this is a limited screening sagittal only MRI for screening of possible epidural abscess or other spinal infection. No axial images were performed. Additionally, the provided sequences are degraded by motion artifact. Straightening of the normal cervical lordosis.  No listhesis. Vertebrae: Vertebral body height maintained without acute or chronic fracture. Bone marrow signal intensity within normal limits. No worrisome osseous lesions. No visible evidence for osteomyelitis discitis or septic arthritis. Cord: Grossly normal signal and morphology. No visible epidural abscess or other collection. No definite abnormal enhancement, although the postcontrast sequence is markedly degraded by motion. Posterior Fossa, vertebral arteries, paraspinal tissues: No acute finding. Disc levels: No significant cervical spondylosis for age. No spinal stenosis. Foramina appear grossly patent. MRI THORACIC SPINE FINDINGS Alignment: Physiologic with preservation of the normal thoracic kyphosis. No listhesis. Vertebrae: Vertebral body height maintained without acute or chronic fracture. No worrisome osseous lesions. No MRI evidence for osteomyelitis discitis or septic arthritis. Cord: Grossly normal signal and morphology on this motion degraded exam. No visible epidural abscess or other collection. No abnormal enhancement,  although the postcontrast sequences are markedly degraded by motion. Paraspinal and other soft tissues: No acute finding. Disc levels: No significant disc pathology within the thoracic spine for age. No spinal stenosis. Foramina appear grossly patent. MRI LUMBAR SPINE FINDINGS Segmentation: Standard. Lowest well-formed disc space labeled L5-S1. Alignment: Chronic bilateral pars defects at L5 with associated 6 mm spondylolisthesis. Alignment otherwise normal with preservation of the normal lumbar lordosis. Vertebrae: Vertebral body height maintained without acute or chronic fracture. Bone marrow signal intensity within normal limits. No worrisome osseous lesions. Degenerative endplate changes with Schmorl's node deformities and mild enhancement noted at the inferior endplates of L1 and L2. No other evidence for osteomyelitis discitis or septic arthritis. Conus medullaris and cauda equina: Conus extends to the T12-L1 level. Conus and cauda equina appear normal. No visible epidural abscess or other collection. Paraspinal and other soft tissues: No  acute finding. Disc levels: Mild spondylosis at L1-2 and L2-3 without stenosis. L5-S1: Chronic bilateral pars defects with 6 mm spondylolisthesis. Associated broad posterior pseudo disc bulge/uncovering. No spinal stenosis. Mild bilateral L5 foraminal narrowing. IMPRESSION: 1. Motion degraded exam. Additionally, Please note that this is a limited sagittal only MRI for screening of possible epidural abscess or other spinal infection. No axial images were performed. 2. No MRI evidence for acute infection within the cervical, thoracic, or lumbar spine. No epidural abscess. 3. Chronic bilateral pars defects at L5 with associated 6 mm spondylolisthesis, with resultant mild bilateral L5 foraminal stenosis. 4. Otherwise, no other significant disc pathology within the cervical, thoracic, or lumbar spine for age. No other stenosis or impingement. Electronically Signed   By: Rise Mu M.D.   On: 11/29/2022 23:45   MR THORACIC SPINE W WO CONTRAST  Result Date: 11/29/2022 CLINICAL DATA:  Initial evaluation for concern for spinal epidural abscess. EXAM: MRI CERVICAL, THORACIC AND LUMBAR SPINE WITHOUT AND WITH CONTRAST TECHNIQUE: Multiplanar and multiecho pulse sequences of the cervical spine, to include the craniocervical junction and cervicothoracic junction, and thoracic and lumbar spine, were obtained without and with intravenous contrast. CONTRAST:  10mL GADAVIST GADOBUTROL 1 MMOL/ML IV SOLN COMPARISON:  None Available. FINDINGS: MRI CERVICAL SPINE FINDINGS Alignment: Please note that this is a limited screening sagittal only MRI for screening of possible epidural abscess or other spinal infection. No axial images were performed. Additionally, the provided sequences are degraded by motion artifact. Straightening of the normal cervical lordosis.  No listhesis. Vertebrae: Vertebral body height maintained without acute or chronic fracture. Bone marrow signal intensity within normal limits. No worrisome osseous lesions. No visible evidence for osteomyelitis discitis or septic arthritis. Cord: Grossly normal signal and morphology. No visible epidural abscess or other collection. No definite abnormal enhancement, although the postcontrast sequence is markedly degraded by motion. Posterior Fossa, vertebral arteries, paraspinal tissues: No acute finding. Disc levels: No significant cervical spondylosis for age. No spinal stenosis. Foramina appear grossly patent. MRI THORACIC SPINE FINDINGS Alignment: Physiologic with preservation of the normal thoracic kyphosis. No listhesis. Vertebrae: Vertebral body height maintained without acute or chronic fracture. No worrisome osseous lesions. No MRI evidence for osteomyelitis discitis or septic arthritis. Cord: Grossly normal signal and morphology on this motion degraded exam. No visible epidural abscess or other collection. No abnormal  enhancement, although the postcontrast sequences are markedly degraded by motion. Paraspinal and other soft tissues: No acute finding. Disc levels: No significant disc pathology within the thoracic spine for age. No spinal stenosis. Foramina appear grossly patent. MRI LUMBAR SPINE FINDINGS Segmentation: Standard. Lowest well-formed disc space labeled L5-S1. Alignment: Chronic bilateral pars defects at L5 with associated 6 mm spondylolisthesis. Alignment otherwise normal with preservation of the normal lumbar lordosis. Vertebrae: Vertebral body height maintained without acute or chronic fracture. Bone marrow signal intensity within normal limits. No worrisome osseous lesions. Degenerative endplate changes with Schmorl's node deformities and mild enhancement noted at the inferior endplates of L1 and L2. No other evidence for osteomyelitis discitis or septic arthritis. Conus medullaris and cauda equina: Conus extends to the T12-L1 level. Conus and cauda equina appear normal. No visible epidural abscess or other collection. Paraspinal and other soft tissues: No acute finding. Disc levels: Mild spondylosis at L1-2 and L2-3 without stenosis. L5-S1: Chronic bilateral pars defects with 6 mm spondylolisthesis. Associated broad posterior pseudo disc bulge/uncovering. No spinal stenosis. Mild bilateral L5 foraminal narrowing. IMPRESSION: 1. Motion degraded exam. Additionally, Please note that this is  a limited sagittal only MRI for screening of possible epidural abscess or other spinal infection. No axial images were performed. 2. No MRI evidence for acute infection within the cervical, thoracic, or lumbar spine. No epidural abscess. 3. Chronic bilateral pars defects at L5 with associated 6 mm spondylolisthesis, with resultant mild bilateral L5 foraminal stenosis. 4. Otherwise, no other significant disc pathology within the cervical, thoracic, or lumbar spine for age. No other stenosis or impingement. Electronically Signed    By: Rise Mu M.D.   On: 11/29/2022 23:45   MR Cervical Spine W and Wo Contrast  Result Date: 11/29/2022 CLINICAL DATA:  Initial evaluation for concern for spinal epidural abscess. EXAM: MRI CERVICAL, THORACIC AND LUMBAR SPINE WITHOUT AND WITH CONTRAST TECHNIQUE: Multiplanar and multiecho pulse sequences of the cervical spine, to include the craniocervical junction and cervicothoracic junction, and thoracic and lumbar spine, were obtained without and with intravenous contrast. CONTRAST:  10mL GADAVIST GADOBUTROL 1 MMOL/ML IV SOLN COMPARISON:  None Available. FINDINGS: MRI CERVICAL SPINE FINDINGS Alignment: Please note that this is a limited screening sagittal only MRI for screening of possible epidural abscess or other spinal infection. No axial images were performed. Additionally, the provided sequences are degraded by motion artifact. Straightening of the normal cervical lordosis.  No listhesis. Vertebrae: Vertebral body height maintained without acute or chronic fracture. Bone marrow signal intensity within normal limits. No worrisome osseous lesions. No visible evidence for osteomyelitis discitis or septic arthritis. Cord: Grossly normal signal and morphology. No visible epidural abscess or other collection. No definite abnormal enhancement, although the postcontrast sequence is markedly degraded by motion. Posterior Fossa, vertebral arteries, paraspinal tissues: No acute finding. Disc levels: No significant cervical spondylosis for age. No spinal stenosis. Foramina appear grossly patent. MRI THORACIC SPINE FINDINGS Alignment: Physiologic with preservation of the normal thoracic kyphosis. No listhesis. Vertebrae: Vertebral body height maintained without acute or chronic fracture. No worrisome osseous lesions. No MRI evidence for osteomyelitis discitis or septic arthritis. Cord: Grossly normal signal and morphology on this motion degraded exam. No visible epidural abscess or other collection. No  abnormal enhancement, although the postcontrast sequences are markedly degraded by motion. Paraspinal and other soft tissues: No acute finding. Disc levels: No significant disc pathology within the thoracic spine for age. No spinal stenosis. Foramina appear grossly patent. MRI LUMBAR SPINE FINDINGS Segmentation: Standard. Lowest well-formed disc space labeled L5-S1. Alignment: Chronic bilateral pars defects at L5 with associated 6 mm spondylolisthesis. Alignment otherwise normal with preservation of the normal lumbar lordosis. Vertebrae: Vertebral body height maintained without acute or chronic fracture. Bone marrow signal intensity within normal limits. No worrisome osseous lesions. Degenerative endplate changes with Schmorl's node deformities and mild enhancement noted at the inferior endplates of L1 and L2. No other evidence for osteomyelitis discitis or septic arthritis. Conus medullaris and cauda equina: Conus extends to the T12-L1 level. Conus and cauda equina appear normal. No visible epidural abscess or other collection. Paraspinal and other soft tissues: No acute finding. Disc levels: Mild spondylosis at L1-2 and L2-3 without stenosis. L5-S1: Chronic bilateral pars defects with 6 mm spondylolisthesis. Associated broad posterior pseudo disc bulge/uncovering. No spinal stenosis. Mild bilateral L5 foraminal narrowing. IMPRESSION: 1. Motion degraded exam. Additionally, Please note that this is a limited sagittal only MRI for screening of possible epidural abscess or other spinal infection. No axial images were performed. 2. No MRI evidence for acute infection within the cervical, thoracic, or lumbar spine. No epidural abscess. 3. Chronic bilateral pars defects at L5 with  associated 6 mm spondylolisthesis, with resultant mild bilateral L5 foraminal stenosis. 4. Otherwise, no other significant disc pathology within the cervical, thoracic, or lumbar spine for age. No other stenosis or impingement. Electronically  Signed   By: Rise Mu M.D.   On: 11/29/2022 23:45   DG Lumbar Spine Complete  Result Date: 11/29/2022 CLINICAL DATA:  Back pain.  Multiple falls. EXAM: LUMBAR SPINE - COMPLETE 4+ VIEW COMPARISON:  CT of the abdomen and pelvis 07/21/2021 FINDINGS: Grade 1 anterolisthesis at L5-S1 associated with bilateral L5 pars defects is stable. The vertebral body heights and alignment are otherwise normal. Mild endplate changes are present at L1-2, L2-3 and L3-4 with some loss of disc height at each of these levels. Atherosclerotic calcifications are again noted in the aorta without aneurysm. IMPRESSION: 1. No acute abnormality. 2. Stable grade 1 anterolisthesis at L5-S1 associated with bilateral L5 pars defects. 3. Multilevel degenerative disc disease. 4. Aortic atherosclerosis. Electronically Signed   By: Marin Roberts M.D.   On: 11/29/2022 19:45   DG Chest 2 View  Result Date: 11/29/2022 CLINICAL DATA:  Fever.  Back pain.  Multiple recent falls. EXAM: CHEST - 2 VIEW COMPARISON:  One-view chest x-ray 07/21/2021 FINDINGS: The heart is enlarged. Moderate pulmonary vascular congestion is present. Chronic elevation of the right hemidiaphragm is noted. Mild bibasilar atelectasis is present. No other focal airspace disease is present. The visualized soft tissues are unremarkable. CABG again noted. IMPRESSION: 1. Cardiomegaly and moderate pulmonary vascular congestion without frank edema. 2. Chronic elevation of the right hemidiaphragm. Electronically Signed   By: Marin Roberts M.D.   On: 11/29/2022 19:44    Cardiac Studies    Patient Profile     82 y.o. male    Assessment & Plan      UTI:  pt presents with UTI , profound weakness , no CP  Troponins were incidentally found to be elevated   He will need adequate treatment of his UTI prior to cardiac work up    2.  CAD :  has know cad, CABG .   Troponins are elevated with a flat trend.  Not c/w ACS .  Echo results pending   3.   Possible epidural abscess :  plans per primary team           For questions or updates, please contact [facility] Please consult [email or web address] for contact info under        Signed, Kristeen Miss, MD  11/30/2022, 1:22 PM

## 2022-11-30 NOTE — Progress Notes (Signed)
PROGRESS NOTE    Jeffery Barr  ZOX:[id number] DOB: 05/08/1940 DOA: 11/29/2022 PCP: [name], MD   Brief Narrative:  HPI:  Jeffery Barr is a 83 y.o. male with hx of CAD with history CABG, A-fib not on anticoagulation, TIA, hypertension, hyperlipidemia, who was transferred ED to ED from [facility] due to initial concern for possible epidural abscess and for MRI.  Patient had presented with few day history of midline lower back pain, and fevers.  Per his wife has also had generalized weakness, possible rigoring, difficulty getting around.  Today slid out of bed but did not fall or hit his head or sustain other injuries.  He has no numbness or weakness in his lower extremities.  There is no bowel or bladder incontinence.  Does acknowledge recent dysuria.     Denies any history of recent or active chest pain (after his episode of arrhythmia /resp failure) in the ED. does acknowledge recent history of dyspnea on exertion.   Assessment & Plan:   Principal Problem:   Urinary tract infection Active Problems:   NSTEMI (non-ST elevated myocardial infarction) (HCC)   Severe sepsis (HCC)   Hypoxic respiratory failure (HCC)   Acute on chronic diastolic heart failure (HCC)   Atrial fibrillation with RVR (HCC)   Acute hypoxic respiratory failure (HCC)  Severe sepsis secondary to UTI, POA: Patient meets criteria for severe sepsis based on fever, tachycardia, tachypnea and endorgan dysfunction such as AKI.  Has been started on vancomycin and cefepime.  No other source of infection other than UTI and thus I will narrow antibiotics to Rocephin only.  Follow urine and blood culture.  Acute hypoxic respiratory failure secondary to acute congestive heart failure of unknown type: Patient required BiPAP for a brief period of time in the ED.  Currently weaned down to 4 L of oxygen.  Chest x-ray with pulmonary edema with elevated BNP.  Echo done about a year ago showed normal ejection  fraction.  Now with possible NSTEMI, could have systolic congestive heart failure, echo pending.  Cardiology on board.  Patient received couple of doses of Lasix.  Per cardiology note, they are going to start him on Lasix twice daily but I do not see the orders.  Will defer to cardiology for that.  NSTEMI: Significant elevated troponin around 2000, repeat troponin pending.  Patient received loading dose of aspirin and has been started on heparin.  Management per cardiology.  Tachyarrhythmia, MAT vs atrial fibrillation: Previous history of paroxysmal atrial fibrillation during episode of COVID, reportedly was on [facility] previously but could not afford.  Currently on heparin.  Has been started on Coreg and digoxin.  Management per cardiology.  Midline back pain: Initial concern was possible epidural abscess.  However MRI ruled out any signs of infection.  Continue current pain management.  PT OT.  History of hypertension: Blood pressure was elevated earlier but now low normal.  Will hold antihypertensives in the setting of of sepsis.  Hyperlipidemia: Lipid panel pending.  Continue statin.  DVT prophylaxis: Heparin   Code Status: Full Code  Family Communication: wife present at bedside.  Plan of care discussed with patient in length and he/she verbalized understanding and agreed with it.  Status is: Inpatient Remains inpatient appropriate because: Patient with NSTEMI and severe sepsis, needs heparin and perhaps cardiac cath down the road.   Estimated body mass index is 32.49 kg/m as calculated from the following:   Height as of this encounter: 5\' 9"  (  1.753 m).   Weight as of this encounter: 99.8 kg.    Nutritional Assessment: Body mass index is 32.49 kg/m.Marland Kitchen Seen by dietician.  I agree with the assessment and plan as outlined below: Nutrition Status:        . Skin Assessment: I have examined the patient's skin and I agree with the wound assessment as performed by the wound care RN as  outlined below:    Consultants:  Cardiology  Procedures:  As above  Antimicrobials:  Anti-infectives (From admission, onward)    Start     Dose/Rate Route Frequency Ordered Stop   11/30/22 1700  vancomycin (VANCOREADY) IVPB 1250 mg/250 mL        1,250 mg 166.7 mL/hr over 90 Minutes Intravenous Every 24 hours 11/30/22 0721     11/30/22 0415  ceFEPIme (MAXIPIME) 2 g in sodium chloride 0.9 % 100 mL IVPB        2 g 200 mL/hr over 30 Minutes Intravenous Every 12 hours 11/30/22 0358     11/29/22 1630  vancomycin (VANCOCIN) IVPB 1000 mg/200 mL premix       Placed in "And" Linked Group   1,000 mg 200 mL/hr over 60 Minutes Intravenous  Once 11/29/22 1616 11/30/22 0327   11/29/22 1615  ceFEPIme (MAXIPIME) 2 g in sodium chloride 0.9 % 100 mL IVPB        2 g 200 mL/hr over 30 Minutes Intravenous  Once 11/29/22 1612 11/29/22 1748   11/29/22 1615  metroNIDAZOLE (FLAGYL) IVPB 500 mg        500 mg 100 mL/hr over 60 Minutes Intravenous  Once 11/29/22 1612 11/29/22 1857   11/29/22 1615  vancomycin (VANCOCIN) IVPB 1000 mg/200 mL premix  Status:  Discontinued        1,000 mg 200 mL/hr over 60 Minutes Intravenous  Once 11/29/22 1612 11/29/22 1613   11/29/22 1615  vancomycin (VANCOCIN) IVPB 1000 mg/200 mL premix       Placed in "And" Linked Group   1,000 mg 200 mL/hr over 60 Minutes Intravenous  Once 11/29/22 1616 11/29/22 1754         Subjective: Patient seen and examined in the ED.  Wife at the bedside.  He feels better.  He denied any complaint such as chest pain or shortness of breath or having back pain today.  Objective: Vitals:   11/30/22 0500 11/30/22 0530 11/30/22 0542 11/30/22 0545  BP: 111/75 101/72  118/73  Pulse: 86 91  (!) 105  Resp: (!) 27 (!) 26  (!) 27  Temp:   98.8 F (37.1 C)   TempSrc:   Oral   SpO2: 99% 99%  95%  Weight:      Height:        Intake/Output Summary (Last 24 hours) at 11/30/2022 0850 Last data filed at 11/30/2022 0327 Gross per 24 hour  Intake  1539.86 ml  Output --  Net 1539.86 ml   Filed Weights   11/29/22 1547  Weight: 99.8 kg    Examination:  General exam: Appears calm and comfortable  Respiratory system: Clear to auscultation. Respiratory effort normal. Cardiovascular system: S1 & S2 heard, RRR. No JVD, murmurs, rubs, gallops or clicks.  Trace pitting edema bilateral lower extremity. Gastrointestinal system: Abdomen is nondistended, soft and nontender. No organomegaly or masses felt. Normal bowel sounds heard. Central nervous system: Alert and oriented. No focal neurological deficits. Extremities: Symmetric 5 x 5 power. Skin: No rashes, lesions or ulcers Psychiatry: Judgement and insight appear  normal. Mood & affect appropriate.    Data Reviewed: I have personally reviewed following labs and imaging studies  CBC: Recent Labs  Lab 11/29/22 1605 11/30/22 0433  WBC 9.8 10.1  HGB 14.2 12.4*  HCT 42.1 37.7*  MCV 85.9 88.5  PLT 154 145*   Basic Metabolic Panel: Recent Labs  Lab 11/29/22 1605 11/30/22 0433  NA 135 136  K 3.7 3.7  CL 98 105  CO2 21* 20*  GLUCOSE 134* 146*  BUN 19 18  CREATININE 1.29* 1.49*  CALCIUM 8.9 8.2*  MG  --  1.9  PHOS  --  2.7   GFR: Estimated Creatinine Clearance: 46 mL/min (A) (by C-G formula based on SCr of 1.49 mg/dL (H)). Liver Function Tests: Recent Labs  Lab 11/29/22 1605  AST 24  ALT 16  ALKPHOS 53  BILITOT 1.1  PROT 7.3  ALBUMIN 3.5   No results for input(s): "LIPASE", "AMYLASE" in the last 168 hours. No results for input(s): "AMMONIA" in the last 168 hours. Coagulation Profile: No results for input(s): "INR", "PROTIME" in the last 168 hours. Cardiac Enzymes: No results for input(s): "CKTOTAL", "CKMB", "CKMBINDEX", "TROPONINI" in the last 168 hours. BNP (last 3 results) No results for input(s): "PROBNP" in the last 8760 hours. HbA1C: No results for input(s): "HGBA1C" in the last 72 hours. CBG: Recent Labs  Lab 11/29/22 1556  GLUCAP 143*   Lipid  Profile: No results for input(s): "CHOL", "HDL", "LDLCALC", "TRIG", "CHOLHDL", "LDLDIRECT" in the last 72 hours. Thyroid Function Tests: No results for input(s): "TSH", "T4TOTAL", "FREET4", "T3FREE", "THYROIDAB" in the last 72 hours. Anemia Panel: No results for input(s): "VITAMINB12", "FOLATE", "FERRITIN", "TIBC", "IRON", "RETICCTPCT" in the last 72 hours. Sepsis Labs: Recent Labs  Lab 11/29/22 1605 11/29/22 1805  LATICACIDVEN 2.0* 1.0    Recent Results (from the past 240 hour(s))  Resp panel by RT-PCR (RSV, Flu A&B, Covid)     Status: None   Collection Time: 11/29/22  4:26 PM   Specimen: Nasal Swab  Result Value Ref Range Status   SARS Coronavirus 2 by RT PCR NEGATIVE NEGATIVE Final    Comment: (NOTE) SARS-CoV-2 target nucleic acids are NOT DETECTED.  The SARS-CoV-2 RNA is generally detectable in upper respiratory specimens during the acute phase of infection. The lowest concentration of SARS-CoV-2 viral copies this assay can detect is 138 copies/mL. A negative result does not preclude SARS-Cov-2 infection and should not be used as the sole basis for treatment or other patient management decisions. A negative result may occur with  improper specimen collection/handling, submission of specimen other than nasopharyngeal swab, presence of viral mutation(s) within the areas targeted by this assay, and inadequate number of viral copies(<138 copies/mL). A negative result must be combined with clinical observations, patient history, and epidemiological information. The expected result is Negative.  Fact Sheet for Patients:  BloggerCourse.com  Fact Sheet for Healthcare Providers:  SeriousBroker.it  This test is no t yet approved or cleared by the [place] FDA and  has been authorized for detection and/or diagnosis of SARS-CoV-2 by FDA under an Emergency Use Authorization (EUA). This EUA will remain  in effect (meaning this  test can be used) for the duration of the COVID-19 declaration under Section 564(b)(1) of the Act, 21 U.S.C.section 360bbb-3(b)(1), unless the authorization is terminated  or revoked sooner.       Influenza A by PCR NEGATIVE NEGATIVE Final   Influenza B by PCR NEGATIVE NEGATIVE Final    Comment: (NOTE) The Xpert Xpress  SARS-CoV-2/FLU/RSV plus assay is intended as an aid in the diagnosis of influenza from Nasopharyngeal swab specimens and should not be used as a sole basis for treatment. Nasal washings and aspirates are unacceptable for Xpert Xpress SARS-CoV-2/FLU/RSV testing.  Fact Sheet for Patients: BloggerCourse.com  Fact Sheet for Healthcare Providers: SeriousBroker.it  This test is not yet approved or cleared by the [place] FDA and has been authorized for detection and/or diagnosis of SARS-CoV-2 by FDA under an Emergency Use Authorization (EUA). This EUA will remain in effect (meaning this test can be used) for the duration of the COVID-19 declaration under Section 564(b)(1) of the Act, 21 U.S.C. section 360bbb-3(b)(1), unless the authorization is terminated or revoked.     Resp Syncytial Virus by PCR NEGATIVE NEGATIVE Final    Comment: (NOTE) Fact Sheet for Patients: BloggerCourse.com  Fact Sheet for Healthcare Providers: SeriousBroker.it  This test is not yet approved or cleared by the [place] FDA and has been authorized for detection and/or diagnosis of SARS-CoV-2 by FDA under an Emergency Use Authorization (EUA). This EUA will remain in effect (meaning this test can be used) for the duration of the COVID-19 declaration under Section 564(b)(1) of the Act, 21 U.S.C. section 360bbb-3(b)(1), unless the authorization is terminated or revoked.  Performed at [facility], [address] [place], [place] [address]   SARS Coronavirus 2 by RT PCR  (hospital order, performed in [facility] hospital lab) *cepheid single result test* Anterior Nasal Swab     Status: None   Collection Time: 11/30/22 12:03 AM   Specimen: Anterior Nasal Swab  Result Value Ref Range Status   SARS Coronavirus 2 by RT PCR NEGATIVE NEGATIVE Final    Comment: Performed at [facility], [address] [place], [place] [address]     Radiology Studies: DG Chest Port 1 View  Result Date: 11/30/2022 CLINICAL DATA:  Shortness of breath. EXAM: PORTABLE CHEST 1 VIEW COMPARISON:  November 29, 2022 FINDINGS: Multiple sternal wires are noted. The cardiac silhouette is borderline in size and stable in appearance. Low lung volumes are seen with chronic, moderate severity elevation of the right hemidiaphragm. Mild diffusely increased interstitial lung markings are noted with stable prominence of the pulmonary vasculature. Mild bibasilar atelectasis is seen, right greater than left. No pleural effusion or pneumothorax is identified. Multilevel degenerative changes are seen throughout the thoracic spine. IMPRESSION: 1. Stable pulmonary vascular congestion with a mild component of suspected interstitial edema. 2. Chronic elevation of the right hemidiaphragm with mild bibasilar atelectasis, right greater than left. Electronically Signed   By: Aram Candela M.D.   On: 11/30/2022 02:07   DG Skull 1-3 Views  Result Date: 11/29/2022 CLINICAL DATA:  Foreign body in auricle.  MRI clearance. EXAM: SKULL - 1-3 VIEW COMPARISON:  None Available. FINDINGS: There is no evidence of skull fracture or other focal bone lesions. No radiopaque foreign body is seen. IMPRESSION: Negative. Electronically Signed   By: Thornell Sartorius M.D.   On: 11/29/2022 23:48   MR Lumbar Spine W Wo Contrast  Result Date: 11/29/2022 CLINICAL DATA:  Initial evaluation for concern for spinal epidural abscess. EXAM: MRI CERVICAL, THORACIC AND LUMBAR SPINE WITHOUT AND WITH CONTRAST TECHNIQUE: Multiplanar and  multiecho pulse sequences of the cervical spine, to include the craniocervical junction and cervicothoracic junction, and thoracic and lumbar spine, were obtained without and with intravenous contrast. CONTRAST:  10mL GADAVIST GADOBUTROL 1 MMOL/ML IV SOLN COMPARISON:  None Available. FINDINGS: MRI CERVICAL SPINE  FINDINGS Alignment: Please note that this is a limited screening sagittal only MRI for screening of possible epidural abscess or other spinal infection. No axial images were performed. Additionally, the provided sequences are degraded by motion artifact. Straightening of the normal cervical lordosis.  No listhesis. Vertebrae: Vertebral body height maintained without acute or chronic fracture. Bone marrow signal intensity within normal limits. No worrisome osseous lesions. No visible evidence for osteomyelitis discitis or septic arthritis. Cord: Grossly normal signal and morphology. No visible epidural abscess or other collection. No definite abnormal enhancement, although the postcontrast sequence is markedly degraded by motion. Posterior Fossa, vertebral arteries, paraspinal tissues: No acute finding. Disc levels: No significant cervical spondylosis for age. No spinal stenosis. Foramina appear grossly patent. MRI THORACIC SPINE FINDINGS Alignment: Physiologic with preservation of the normal thoracic kyphosis. No listhesis. Vertebrae: Vertebral body height maintained without acute or chronic fracture. No worrisome osseous lesions. No MRI evidence for osteomyelitis discitis or septic arthritis. Cord: Grossly normal signal and morphology on this motion degraded exam. No visible epidural abscess or other collection. No abnormal enhancement, although the postcontrast sequences are markedly degraded by motion. Paraspinal and other soft tissues: No acute finding. Disc levels: No significant disc pathology within the thoracic spine for age. No spinal stenosis. Foramina appear grossly patent. MRI LUMBAR SPINE  FINDINGS Segmentation: Standard. Lowest well-formed disc space labeled L5-S1. Alignment: Chronic bilateral pars defects at L5 with associated 6 mm spondylolisthesis. Alignment otherwise normal with preservation of the normal lumbar lordosis. Vertebrae: Vertebral body height maintained without acute or chronic fracture. Bone marrow signal intensity within normal limits. No worrisome osseous lesions. Degenerative endplate changes with Schmorl's node deformities and mild enhancement noted at the inferior endplates of L1 and L2. No other evidence for osteomyelitis discitis or septic arthritis. Conus medullaris and cauda equina: Conus extends to the T12-L1 level. Conus and cauda equina appear normal. No visible epidural abscess or other collection. Paraspinal and other soft tissues: No acute finding. Disc levels: Mild spondylosis at L1-2 and L2-3 without stenosis. L5-S1: Chronic bilateral pars defects with 6 mm spondylolisthesis. Associated broad posterior pseudo disc bulge/uncovering. No spinal stenosis. Mild bilateral L5 foraminal narrowing. IMPRESSION: 1. Motion degraded exam. Additionally, Please note that this is a limited sagittal only MRI for screening of possible epidural abscess or other spinal infection. No axial images were performed. 2. No MRI evidence for acute infection within the cervical, thoracic, or lumbar spine. No epidural abscess. 3. Chronic bilateral pars defects at L5 with associated 6 mm spondylolisthesis, with resultant mild bilateral L5 foraminal stenosis. 4. Otherwise, no other significant disc pathology within the cervical, thoracic, or lumbar spine for age. No other stenosis or impingement. Electronically Signed   By: Rise Mu M.D.   On: 11/29/2022 23:45   MR THORACIC SPINE W WO CONTRAST  Result Date: 11/29/2022 CLINICAL DATA:  Initial evaluation for concern for spinal epidural abscess. EXAM: MRI CERVICAL, THORACIC AND LUMBAR SPINE WITHOUT AND WITH CONTRAST TECHNIQUE:  Multiplanar and multiecho pulse sequences of the cervical spine, to include the craniocervical junction and cervicothoracic junction, and thoracic and lumbar spine, were obtained without and with intravenous contrast. CONTRAST:  10mL GADAVIST GADOBUTROL 1 MMOL/ML IV SOLN COMPARISON:  None Available. FINDINGS: MRI CERVICAL SPINE FINDINGS Alignment: Please note that this is a limited screening sagittal only MRI for screening of possible epidural abscess or other spinal infection. No axial images were performed. Additionally, the provided sequences are degraded by motion artifact. Straightening of the normal cervical lordosis.  No listhesis.  Vertebrae: Vertebral body height maintained without acute or chronic fracture. Bone marrow signal intensity within normal limits. No worrisome osseous lesions. No visible evidence for osteomyelitis discitis or septic arthritis. Cord: Grossly normal signal and morphology. No visible epidural abscess or other collection. No definite abnormal enhancement, although the postcontrast sequence is markedly degraded by motion. Posterior Fossa, vertebral arteries, paraspinal tissues: No acute finding. Disc levels: No significant cervical spondylosis for age. No spinal stenosis. Foramina appear grossly patent. MRI THORACIC SPINE FINDINGS Alignment: Physiologic with preservation of the normal thoracic kyphosis. No listhesis. Vertebrae: Vertebral body height maintained without acute or chronic fracture. No worrisome osseous lesions. No MRI evidence for osteomyelitis discitis or septic arthritis. Cord: Grossly normal signal and morphology on this motion degraded exam. No visible epidural abscess or other collection. No abnormal enhancement, although the postcontrast sequences are markedly degraded by motion. Paraspinal and other soft tissues: No acute finding. Disc levels: No significant disc pathology within the thoracic spine for age. No spinal stenosis. Foramina appear grossly patent. MRI  LUMBAR SPINE FINDINGS Segmentation: Standard. Lowest well-formed disc space labeled L5-S1. Alignment: Chronic bilateral pars defects at L5 with associated 6 mm spondylolisthesis. Alignment otherwise normal with preservation of the normal lumbar lordosis. Vertebrae: Vertebral body height maintained without acute or chronic fracture. Bone marrow signal intensity within normal limits. No worrisome osseous lesions. Degenerative endplate changes with Schmorl's node deformities and mild enhancement noted at the inferior endplates of L1 and L2. No other evidence for osteomyelitis discitis or septic arthritis. Conus medullaris and cauda equina: Conus extends to the T12-L1 level. Conus and cauda equina appear normal. No visible epidural abscess or other collection. Paraspinal and other soft tissues: No acute finding. Disc levels: Mild spondylosis at L1-2 and L2-3 without stenosis. L5-S1: Chronic bilateral pars defects with 6 mm spondylolisthesis. Associated broad posterior pseudo disc bulge/uncovering. No spinal stenosis. Mild bilateral L5 foraminal narrowing. IMPRESSION: 1. Motion degraded exam. Additionally, Please note that this is a limited sagittal only MRI for screening of possible epidural abscess or other spinal infection. No axial images were performed. 2. No MRI evidence for acute infection within the cervical, thoracic, or lumbar spine. No epidural abscess. 3. Chronic bilateral pars defects at L5 with associated 6 mm spondylolisthesis, with resultant mild bilateral L5 foraminal stenosis. 4. Otherwise, no other significant disc pathology within the cervical, thoracic, or lumbar spine for age. No other stenosis or impingement. Electronically Signed   By: Rise Mu M.D.   On: 11/29/2022 23:45   MR Cervical Spine W and Wo Contrast  Result Date: 11/29/2022 CLINICAL DATA:  Initial evaluation for concern for spinal epidural abscess. EXAM: MRI CERVICAL, THORACIC AND LUMBAR SPINE WITHOUT AND WITH CONTRAST  TECHNIQUE: Multiplanar and multiecho pulse sequences of the cervical spine, to include the craniocervical junction and cervicothoracic junction, and thoracic and lumbar spine, were obtained without and with intravenous contrast. CONTRAST:  10mL GADAVIST GADOBUTROL 1 MMOL/ML IV SOLN COMPARISON:  None Available. FINDINGS: MRI CERVICAL SPINE FINDINGS Alignment: Please note that this is a limited screening sagittal only MRI for screening of possible epidural abscess or other spinal infection. No axial images were performed. Additionally, the provided sequences are degraded by motion artifact. Straightening of the normal cervical lordosis.  No listhesis. Vertebrae: Vertebral body height maintained without acute or chronic fracture. Bone marrow signal intensity within normal limits. No worrisome osseous lesions. No visible evidence for osteomyelitis discitis or septic arthritis. Cord: Grossly normal signal and morphology. No visible epidural abscess or other collection. No definite  abnormal enhancement, although the postcontrast sequence is markedly degraded by motion. Posterior Fossa, vertebral arteries, paraspinal tissues: No acute finding. Disc levels: No significant cervical spondylosis for age. No spinal stenosis. Foramina appear grossly patent. MRI THORACIC SPINE FINDINGS Alignment: Physiologic with preservation of the normal thoracic kyphosis. No listhesis. Vertebrae: Vertebral body height maintained without acute or chronic fracture. No worrisome osseous lesions. No MRI evidence for osteomyelitis discitis or septic arthritis. Cord: Grossly normal signal and morphology on this motion degraded exam. No visible epidural abscess or other collection. No abnormal enhancement, although the postcontrast sequences are markedly degraded by motion. Paraspinal and other soft tissues: No acute finding. Disc levels: No significant disc pathology within the thoracic spine for age. No spinal stenosis. Foramina appear grossly  patent. MRI LUMBAR SPINE FINDINGS Segmentation: Standard. Lowest well-formed disc space labeled L5-S1. Alignment: Chronic bilateral pars defects at L5 with associated 6 mm spondylolisthesis. Alignment otherwise normal with preservation of the normal lumbar lordosis. Vertebrae: Vertebral body height maintained without acute or chronic fracture. Bone marrow signal intensity within normal limits. No worrisome osseous lesions. Degenerative endplate changes with Schmorl's node deformities and mild enhancement noted at the inferior endplates of L1 and L2. No other evidence for osteomyelitis discitis or septic arthritis. Conus medullaris and cauda equina: Conus extends to the T12-L1 level. Conus and cauda equina appear normal. No visible epidural abscess or other collection. Paraspinal and other soft tissues: No acute finding. Disc levels: Mild spondylosis at L1-2 and L2-3 without stenosis. L5-S1: Chronic bilateral pars defects with 6 mm spondylolisthesis. Associated broad posterior pseudo disc bulge/uncovering. No spinal stenosis. Mild bilateral L5 foraminal narrowing. IMPRESSION: 1. Motion degraded exam. Additionally, Please note that this is a limited sagittal only MRI for screening of possible epidural abscess or other spinal infection. No axial images were performed. 2. No MRI evidence for acute infection within the cervical, thoracic, or lumbar spine. No epidural abscess. 3. Chronic bilateral pars defects at L5 with associated 6 mm spondylolisthesis, with resultant mild bilateral L5 foraminal stenosis. 4. Otherwise, no other significant disc pathology within the cervical, thoracic, or lumbar spine for age. No other stenosis or impingement. Electronically Signed   By: Rise Mu M.D.   On: 11/29/2022 23:45   DG Lumbar Spine Complete  Result Date: 11/29/2022 CLINICAL DATA:  Back pain.  Multiple falls. EXAM: LUMBAR SPINE - COMPLETE 4+ VIEW COMPARISON:  CT of the abdomen and pelvis 07/21/2021 FINDINGS:  Grade 1 anterolisthesis at L5-S1 associated with bilateral L5 pars defects is stable. The vertebral body heights and alignment are otherwise normal. Mild endplate changes are present at L1-2, L2-3 and L3-4 with some loss of disc height at each of these levels. Atherosclerotic calcifications are again noted in the aorta without aneurysm. IMPRESSION: 1. No acute abnormality. 2. Stable grade 1 anterolisthesis at L5-S1 associated with bilateral L5 pars defects. 3. Multilevel degenerative disc disease. 4. Aortic atherosclerosis. Electronically Signed   By: Marin Roberts M.D.   On: 11/29/2022 19:45   DG Chest 2 View  Result Date: 11/29/2022 CLINICAL DATA:  Fever.  Back pain.  Multiple recent falls. EXAM: CHEST - 2 VIEW COMPARISON:  One-view chest x-ray 07/21/2021 FINDINGS: The heart is enlarged. Moderate pulmonary vascular congestion is present. Chronic elevation of the right hemidiaphragm is noted. Mild bibasilar atelectasis is present. No other focal airspace disease is present. The visualized soft tissues are unremarkable. CABG again noted. IMPRESSION: 1. Cardiomegaly and moderate pulmonary vascular congestion without frank edema. 2. Chronic elevation of the right hemidiaphragm.  Electronically Signed   By: Marin Roberts M.D.   On: 11/29/2022 19:44    Scheduled Meds:  carvedilol  6.25 mg Oral BID   digoxin  0.5 mg Intravenous Once   ezetimibe  10 mg Oral Daily   rosuvastatin  40 mg Oral Daily   sodium chloride flush  3 mL Intravenous Q12H   Continuous Infusions:  ceFEPime (MAXIPIME) IV Stopped (11/30/22 0503)   heparin 1,000 Units/hr (11/30/22 0721)   vancomycin       LOS: 0 days   Total time spent: 38 minutes  Hughie Closs, MD [facility]  11/30/2022, 8:50 AM   *Please note that this is a verbal dictation therefore any spelling or grammatical errors are due to the "[employer] One" system interpretation.  Please page via Amion and do not message via secure chat for  urgent patient care matters. Secure chat can be used for non urgent patient care matters.  How to contact the [facility] Attending or Consulting provider 7A - 7P or covering provider during after hours 7P -7A, for this patient?  Check the care team in [facility] and look for a) attending/consulting [facility] provider listed and b) the [facility] team listed. Page or secure chat 7A-7P. Log into [email or web address] and use [facility] universal password to access. If you do not have the password, please contact the hospital operator. Locate the [facility] provider you are looking for under [facility] and page to a number that you can be directly reached. If you still have difficulty reaching the provider, please page the [facility] (Director on Call) for the Hospitalists listed on amion for assistance.

## 2022-11-30 NOTE — ED Notes (Signed)
ED TO INPATIENT HANDOFF REPORT  ED Nurse Name and Phone #: Topher A RN [phone]  S Name/Age/Gender Jeffery Barr 82 y.o. male Room/Bed: 002C/002C  Code Status   Code Status: Full Code  Home/SNF/Other Home Patient oriented to: self, place, and time Is this baseline? No   Triage Complete: Triage complete  Chief Complaint Urinary tract infection [N39.0]  Triage Note C/O back pain and bilateral leg weakness. States he has fallen multiple times today.    Allergies No Known Allergies  Level of Care/Admitting Diagnosis ED Disposition     ED Disposition  Admit   Condition  --   Comment  Hospital Area: [facility] [[id number]]  Level of Care: Progressive [102]  Admit to Progressive based on following criteria: RESPIRATORY PROBLEMS hypoxemic/hypercapnic respiratory failure that is responsive to NIPPV (BiPAP) or High Flow Nasal Cannula (6-80 lpm). Frequent assessment/intervention, no > Q2 hrs < Q4 hrs, to maintain oxygenation and pulmonary hygiene.  Admit to Progressive based on following criteria: CARDIOVASCULAR & THORACIC of moderate stability with acute coronary syndrome symptoms/low risk myocardial infarction/hypertensive urgency/arrhythmias/heart failure potentially compromising stability and stable post cardiovascular intervention patients.  May admit patient to [facility] if equivalent level of care is available:: No  Covid Evaluation: Asymptomatic - no recent exposure (last 10 days) testing not required  Diagnosis: Urinary tract infection [161096]  Admitting Physician: Dolly Rias [[id number]]  Attending Physician: Dolly Rias [[id number]]  Certification:: I certify this patient will need inpatient services for at least 2 midnights  Expected Medical Readiness: 12/02/2022          B Medical/Surgery History Past Medical History:  Diagnosis Date   Benign hypertensive heart disease without heart failure    Coronary atherosclerosis of  native coronary artery    S/P CABG   Dyslipidemia    HTN (hypertension)    Obesity    Sinus bradycardia    a. baseline HR 50s.   Past Surgical History:  Procedure Laterality Date   CORONARY ARTERY BYPASS GRAFT     VASECTOMY       A IV Location/Drains/Wounds Patient Lines/Drains/Airways Status     Active Line/Drains/Airways     Name Placement date Placement time Site Days   Peripheral IV 11/29/22 20 G Anterior;Distal;Right Forearm 11/29/22  1607  Forearm  1   Peripheral IV 11/29/22 20 G Anterior;Distal;Left Forearm 11/29/22  2009  Forearm  1            Intake/Output Last 24 hours  Intake/Output Summary (Last 24 hours) at 11/30/2022 1743 Last data filed at 11/30/2022 0327 Gross per 24 hour  Intake 1539.86 ml  Output --  Net 1539.86 ml    Labs/Imaging Results for orders placed or performed during the hospital encounter of 11/29/22 (from the past 48 hour(s))  CBG monitoring, ED     Status: Abnormal   Collection Time: 11/29/22  3:56 PM  Result Value Ref Range   Glucose-Capillary 143 (H) 70 - 99 mg/dL    Comment: Glucose reference range applies only to samples taken after fasting for at least 8 hours.   Comment 1 Notify RN   Basic metabolic panel     Status: Abnormal   Collection Time: 11/29/22  4:05 PM  Result Value Ref Range   Sodium 135 135 - 145 mmol/L   Potassium 3.7 3.5 - 5.1 mmol/L   Chloride 98 98 - 111 mmol/L   CO2 21 (L) 22 - 32 mmol/L   Glucose, Bld 134 (  H) 70 - 99 mg/dL    Comment: Glucose reference range applies only to samples taken after fasting for at least 8 hours.   BUN 19 8 - 23 mg/dL   Creatinine, Ser 6.96 (H) 0.61 - 1.24 mg/dL   Calcium 8.9 8.9 - 29.5 mg/dL   GFR, Estimated 56 (L) >60 mL/min    Comment: (NOTE) Calculated using the CKD-EPI Creatinine Equation (2021)    Anion gap 16 (H) 5 - 15    Comment: Performed at [facility], [address] [place], [place] [address]  CBC     Status: None   Collection Time: 11/29/22   4:05 PM  Result Value Ref Range   WBC 9.8 4.0 - 10.5 K/uL   RBC 4.90 4.22 - 5.81 MIL/uL   Hemoglobin 14.2 13.0 - 17.0 g/dL   HCT 24.4 01.0 - 27.2 %   MCV 85.9 80.0 - 100.0 fL   MCH 29.0 26.0 - 34.0 pg   MCHC 33.7 30.0 - 36.0 g/dL   RDW 53.6 64.4 - 03.4 %   Platelets 154 150 - 400 K/uL   nRBC 0.0 0.0 - 0.2 %    Comment: Performed at [facility], [address] [place], [place] [address]  Sedimentation rate     Status: Abnormal   Collection Time: 11/29/22  4:05 PM  Result Value Ref Range   Sed Rate 85 (H) 0 - 16 mm/hr    Comment: Performed at [facility], [address] [place], [place] [address]  C-reactive protein     Status: Abnormal   Collection Time: 11/29/22  4:05 PM  Result Value Ref Range   CRP 22.2 (H) <1.0 mg/dL    Comment: Performed at [facility], [address] [place], [place] [address]  Hepatic function panel     Status: None   Collection Time: 11/29/22  4:05 PM  Result Value Ref Range   Total Protein 7.3 6.5 - 8.1 g/dL   Albumin 3.5 3.5 - 5.0 g/dL   AST 24 15 - 41 U/L   ALT 16 0 - 44 U/L   Alkaline Phosphatase 53 38 - 126 U/L   Total Bilirubin 1.1 0.3 - 1.2 mg/dL   Bilirubin, Direct 0.2 0.0 - 0.2 mg/dL   Indirect Bilirubin 0.9 0.3 - 0.9 mg/dL    Comment: Performed at [facility], [address] [place], [place] [address]  Lactic acid, plasma     Status: Abnormal   Collection Time: 11/29/22  4:05 PM  Result Value Ref Range   Lactic Acid, Venous 2.0 (HH) 0.5 - 1.9 mmol/L    Comment: CRITICAL RESULT CALLED TO, READ BACK BY AND VERIFIED WITH Ila Mcgill RN [phone] [facility] Performed at [facility], [address] [place], [place] [address]   Blood Culture (routine x 2)     Status: None (Preliminary result)   Collection Time: 11/29/22  4:05 PM   Specimen: BLOOD  Result Value Ref Range   Specimen Description      BLOOD RIGHT ANTECUBITAL Performed at [facility], [address] [place], [place] [address]    Special Requests      BOTTLES DRAWN AEROBIC AND ANAEROBIC Blood Culture adequate volume Performed at [facility], [address] [place], [place] [address]    Culture      NO GROWTH < 24 HOURS Performed at  [facility], [address] [place], [place] [address]    Report Status PENDING   Urinalysis, w/ Reflex to Culture (Infection Suspected) -Urine, Clean Catch     Status: Abnormal   Collection Time: 11/29/22  4:05 PM  Result Value Ref Range   Specimen Source URINE, CLEAN CATCH    Color, Urine YELLOW YELLOW   APPearance CLOUDY (A) CLEAR   Specific Gravity, Urine >=1.030 1.005 - 1.030   pH 5.5 5.0 - 8.0   Glucose, UA NEGATIVE NEGATIVE mg/dL   Hgb urine dipstick LARGE (A) NEGATIVE   Bilirubin Urine SMALL (A) NEGATIVE   Ketones, ur 40 (A) NEGATIVE mg/dL   Protein, ur >=295 (A) NEGATIVE mg/dL   Nitrite NEGATIVE NEGATIVE   Leukocytes,Ua NEGATIVE NEGATIVE   Squamous Epithelial / HPF 0-5 0 - 5 /HPF   WBC, UA 11-20 0 - 5 WBC/hpf    Comment: Reflex urine culture not performed if WBC <=10, OR if Squamous epithelial cells >5. If Squamous epithelial cells >5, suggest recollection.   RBC / HPF 0-5 0 - 5 RBC/hpf   Bacteria, UA MANY (A) NONE SEEN   Granular Casts, UA PRESENT    Amorphous Crystal PRESENT     Comment: Performed at [facility], [address] [place], [place] [address]  Urine Culture     Status: None (Preliminary result)   Collection Time: 11/29/22  4:05 PM   Specimen: Urine, Random  Result Value Ref Range   Specimen Description      URINE, RANDOM Performed at [facility], [address] [place], [place] [address]    Special Requests      NONE Reflexed from [id number] Performed at [facility], [address] [place], [place] [address]    Culture      NO GROWTH Performed at [facility], [address], [place], [place] [address]    Report Status PENDING   Brain natriuretic  peptide     Status: Abnormal   Collection Time: 11/29/22  4:12 PM  Result Value Ref Range   B Natriuretic Peptide 699.5 (H) 0.0 - 100.0 pg/mL    Comment: Performed at [facility], [address] [place], [place] [address]  Resp panel by RT-PCR (RSV, Flu A&B, Covid)     Status: None   Collection Time: 11/29/22  4:26 PM   Specimen: Nasal Swab  Result Value Ref Range   SARS Coronavirus 2 by RT PCR NEGATIVE NEGATIVE    Comment: (NOTE) SARS-CoV-2 target nucleic acids are NOT DETECTED.  The SARS-CoV-2 RNA is generally detectable in upper respiratory specimens during the acute phase of infection. The lowest concentration of SARS-CoV-2 viral copies this assay can detect is 138 copies/mL. A negative result does not preclude SARS-Cov-2 infection and should not be used as the sole basis for treatment or other patient management decisions. A negative result may occur with  improper specimen collection/handling, submission of specimen other than nasopharyngeal swab, presence of viral mutation(s) within the areas targeted by this assay, and inadequate number of viral copies(<138 copies/mL). A negative result must be combined with clinical observations, patient history, and epidemiological information. The expected result is Negative.  Fact Sheet for Patients:  BloggerCourse.com  Fact Sheet for Healthcare Providers:  SeriousBroker.it  This test is no t yet approved or cleared by the [place] FDA and  has been authorized for detection and/or diagnosis of SARS-CoV-2 by FDA under  an Emergency Use Authorization (EUA). This EUA will remain  in effect (meaning this test can be used) for the duration of the COVID-19 declaration under Section 564(b)(1) of the Act, 21 U.S.C.section 360bbb-3(b)(1), unless the authorization is terminated  or revoked sooner.       Influenza A by PCR NEGATIVE NEGATIVE   Influenza B by PCR NEGATIVE  NEGATIVE    Comment: (NOTE) The Xpert Xpress SARS-CoV-2/FLU/RSV plus assay is intended as an aid in the diagnosis of influenza from Nasopharyngeal swab specimens and should not be used as a sole basis for treatment. Nasal washings and aspirates are unacceptable for Xpert Xpress SARS-CoV-2/FLU/RSV testing.  Fact Sheet for Patients: BloggerCourse.com  Fact Sheet for Healthcare Providers: SeriousBroker.it  This test is not yet approved or cleared by the [place] FDA and has been authorized for detection and/or diagnosis of SARS-CoV-2 by FDA under an Emergency Use Authorization (EUA). This EUA will remain in effect (meaning this test can be used) for the duration of the COVID-19 declaration under Section 564(b)(1) of the Act, 21 U.S.C. section 360bbb-3(b)(1), unless the authorization is terminated or revoked.     Resp Syncytial Virus by PCR NEGATIVE NEGATIVE    Comment: (NOTE) Fact Sheet for Patients: BloggerCourse.com  Fact Sheet for Healthcare Providers: SeriousBroker.it  This test is not yet approved or cleared by the [place] FDA and has been authorized for detection and/or diagnosis of SARS-CoV-2 by FDA under an Emergency Use Authorization (EUA). This EUA will remain in effect (meaning this test can be used) for the duration of the COVID-19 declaration under Section 564(b)(1) of the Act, 21 U.S.C. section 360bbb-3(b)(1), unless the authorization is terminated or revoked.  Performed at [facility], [address] [place], [place] [address]   Blood Culture (routine x 2)     Status: None (Preliminary result)   Collection Time: 11/29/22  4:26 PM   Specimen: BLOOD  Result Value Ref Range   Specimen Description      BLOOD LEFT ANTECUBITAL Performed at [facility], [address] [place], [place] [address]    Special Requests      BOTTLES  DRAWN AEROBIC AND ANAEROBIC Blood Culture results may not be optimal due to an inadequate volume of blood received in culture bottles Performed at [facility], [address] [place], [place] [address]    Culture      NO GROWTH < 24 HOURS Performed at [facility], [address] [place], [place] [address]    Report Status PENDING   Lactic acid, plasma     Status: None   Collection Time: 11/29/22  6:05 PM  Result Value Ref Range   Lactic Acid, Venous 1.0 0.5 - 1.9 mmol/L    Comment: Performed at [facility], [address] [place], [place] [address]  SARS Coronavirus 2 by RT PCR (hospital order, performed in [facility] hospital lab) *cepheid single result test* Anterior Nasal Swab     Status: None   Collection Time: 11/30/22 12:03 AM   Specimen: Anterior Nasal Swab  Result Value Ref Range   SARS Coronavirus 2 by RT PCR NEGATIVE NEGATIVE    Comment: Performed at [facility], [address] [place], [place] [address]  Basic metabolic panel     Status: Abnormal   Collection Time: 11/30/22  4:33 AM  Result Value Ref Range   Sodium 136 135 - 145 mmol/L  Potassium 3.7 3.5 - 5.1 mmol/L   Chloride 105 98 - 111 mmol/L   CO2 20 (L) 22 - 32 mmol/L   Glucose, Bld 146 (H) 70 - 99 mg/dL    Comment: Glucose reference range applies only to samples taken after fasting for at least 8 hours.   BUN 18 8 - 23 mg/dL   Creatinine, Ser 1.30 (H) 0.61 - 1.24 mg/dL   Calcium 8.2 (L) 8.9 - 10.3 mg/dL   GFR, Estimated 47 (L) >60 mL/min    Comment: (NOTE) Calculated using the CKD-EPI Creatinine Equation (2021)    Anion gap 11 5 - 15    Comment: Performed at [facility], [address], [place], [place] [address]  CBC     Status: Abnormal   Collection Time: 11/30/22  4:33 AM  Result Value Ref Range   WBC 10.1 4.0 - 10.5 K/uL   RBC 4.26 4.22 - 5.81 MIL/uL   Hemoglobin 12.4 (L) 13.0 - 17.0 g/dL   HCT 46.9 (L) 62.9 - 52.8 %   MCV 88.5 80.0 - 100.0 fL   MCH  29.1 26.0 - 34.0 pg   MCHC 32.9 30.0 - 36.0 g/dL   RDW 41.3 24.4 - 01.0 %   Platelets 145 (L) 150 - 400 K/uL   nRBC 0.0 0.0 - 0.2 %    Comment: Performed at [facility], [address] [place], [place] [address]  Magnesium     Status: None   Collection Time: 11/30/22  4:33 AM  Result Value Ref Range   Magnesium 1.9 1.7 - 2.4 mg/dL    Comment: Performed at [facility], [address], [place], [place] [address]  Phosphorus     Status: None   Collection Time: 11/30/22  4:33 AM  Result Value Ref Range   Phosphorus 2.7 2.5 - 4.6 mg/dL    Comment: Performed at [facility], [address] [place], [place] [address]  Troponin I (High Sensitivity)     Status: Abnormal   Collection Time: 11/30/22  4:33 AM  Result Value Ref Range   Troponin I (High Sensitivity) 2,083 (HH) <18 ng/L    Comment: CRITICAL RESULT CALLED TO, READ BACK BY AND VERIFIED WITH A. COBB RN 11/30/22 @0533  BY J. WHITE (NOTE) Elevated high sensitivity troponin I (hsTnI) values and significant  changes across serial measurements may suggest ACS but many other  chronic and acute conditions are known to elevate hsTnI results.  Refer to the "Links" section for chest pain algorithms and additional  guidance. Performed at [facility], [address] [place], [place] [address]   Lipid panel     Status: Abnormal   Collection Time: 11/30/22  8:44 AM  Result Value Ref Range   Cholesterol 135 0 - 200 mg/dL   Triglycerides 65 <875 mg/dL   HDL 38 (L) >64 mg/dL   Total CHOL/HDL Ratio 3.6 RATIO   VLDL 13 0 - 40 mg/dL   LDL Cholesterol 84 0 - 99 mg/dL    Comment:        Total Cholesterol/HDL:CHD Risk Coronary Heart Disease Risk Table                     Men   Women  1/2 Average Risk   3.4   3.3  Average Risk       5.0   4.4  2 X Average Risk   9.6   7.1  3 X Average Risk  23.4   11.0        Use the calculated Patient Ratio above and the CHD Risk Table to determine the patient's CHD Risk.         ATP III CLASSIFICATION (LDL):  <100     mg/dL   Optimal  387-564  mg/dL   Near or Above                    Optimal  130-159  mg/dL   Borderline  332-951  mg/dL   High  >884     mg/dL   Very High Performed at [facility], [address], [place], [place] [address]   Hemoglobin A1c     Status: None   Collection Time: 11/30/22  8:44 AM  Result Value Ref Range   Hgb A1c MFr Bld 5.6 4.8 - 5.6 %    Comment: (NOTE) Pre diabetes:          5.7%-6.4%  Diabetes:              >6.4%  Glycemic control for   <7.0% adults with diabetes    Mean Plasma Glucose 114.02 mg/dL    Comment: Performed at [facility], [address], [place], [place] [address]  Troponin I (High Sensitivity)     Status: Abnormal   Collection Time: 11/30/22  8:44 AM  Result Value Ref Range   Troponin I (High Sensitivity) 2,564 (HH) <18 ng/L    Comment: CRITICAL VALUE NOTED. VALUE IS CONSISTENT WITH PREVIOUSLY REPORTED/CALLED VALUE (NOTE) Elevated high sensitivity troponin I (hsTnI) values and significant  changes across serial measurements may suggest ACS but many other  chronic and acute conditions are known to elevate hsTnI results.  Refer to the "Links" section for chest pain algorithms and additional  guidance. Performed at [facility], [address] [place], [place] [address]   Troponin I (High Sensitivity)     Status: Abnormal   Collection Time: 11/30/22 10:40 AM  Result Value Ref Range   Troponin I (High Sensitivity) 2,041 (HH) <18 ng/L    Comment: CRITICAL VALUE NOTED. VALUE IS CONSISTENT WITH PREVIOUSLY REPORTED/CALLED VALUE DELTA CHECK NOTED (NOTE) Elevated high sensitivity troponin I (hsTnI) values and significant  changes across serial measurements may suggest ACS but many other  chronic and acute conditions are known to elevate hsTnI results.  Refer to the "Links" section for chest pain algorithms and additional  guidance. Performed at [facility], [address], [place], [place] [address]   Heparin level (unfractionated)     Status: Abnormal   Collection Time: 11/30/22  5:00 PM  Result Value Ref Range   Heparin Unfractionated 0.18 (L) 0.30 - 0.70 IU/mL    Comment: (NOTE) The clinical reportable range upper limit is being lowered to >1.10 to align with the FDA approved guidance for the current laboratory assay.  If heparin results are below expected values, and patient dosage has  been confirmed, suggest follow up testing of antithrombin III levels. Performed at [facility], [address], [place], [place] [address]    ECHOCARDIOGRAM COMPLETE  Result Date: 11/30/2022    ECHOCARDIOGRAM REPORT   Patient Name:   Talmadge T Wire Date of Exam: 11/30/2022 Medical Rec #:  [id number]        Height:       69.0 in Accession #:    [id number]  Weight:       220.0 lb Date of Birth:  Jan 20, 1941         BSA:          2.151 m Patient Age:    82 years         BP:           163/75 mmHg Patient Gender: M                HR:           84 bpm. Exam Location:  Inpatient Procedure: 2D Echo, Color Doppler, Cardiac Doppler and Intracardiac            Opacification Agent Indications:    I50.9* Heart failure (unspecified)  History:        Patient has prior history of Echocardiogram examinations, most                 recent 06/01/2021. CAD, Arrythmias:Atrial Fibrillation; Risk                 Factors:Hypertension and Dyslipidemia.  Sonographer:    Irving Burton Senior RDCS Referring Phys: [id number] Dolly Rias  Sonographer Comments: Technically difficult due to poor echo windows. IMPRESSIONS  1. Left ventricular ejection fraction, by estimation, is 55 to 60%. The left ventricle has normal function. Left ventricular endocardial border not optimally defined to evaluate regional wall motion. There is mild concentric left ventricular hypertrophy. Left ventricular diastolic function could not be evaluated.  2. Right ventricular systolic function is mildly reduced. The right  ventricular size is not well visualized. Tricuspid regurgitation signal is inadequate for assessing PA pressure.  3. The mitral valve is normal in structure. Trivial mitral valve regurgitation.  4. The aortic valve has an indeterminant number of cusps. Aortic valve regurgitation is not visualized.  5. The inferior vena cava is normal in size with <50% respiratory variability, suggesting right atrial pressure of 8 mmHg. FINDINGS  Left Ventricle: Left ventricular ejection fraction, by estimation, is 55 to 60%. The left ventricle has normal function. Left ventricular endocardial border not optimally defined to evaluate regional wall motion. Definity contrast agent was given IV to delineate the left ventricular endocardial borders. The left ventricular internal cavity size was normal in size. There is mild concentric left ventricular hypertrophy. Left ventricular diastolic function could not be evaluated due to atrial fibrillation. Left ventricular diastolic function could not be evaluated. Right Ventricle: The right ventricular size is not well visualized. Right vetricular wall thickness was not well visualized. Right ventricular systolic function is mildly reduced. Tricuspid regurgitation signal is inadequate for assessing PA pressure. Left Atrium: Left atrial size was normal in size. Right Atrium: Right atrial size was normal in size. Pericardium: There is no evidence of pericardial effusion. Presence of epicardial fat layer. Mitral Valve: The mitral valve is normal in structure. Trivial mitral valve regurgitation. Tricuspid Valve: The tricuspid valve is normal in structure. Tricuspid valve regurgitation is trivial. Aortic Valve: The aortic valve has an indeterminant number of cusps. Aortic valve regurgitation is not visualized. Pulmonic Valve: The pulmonic valve was normal in structure. Pulmonic valve regurgitation is mild. Aorta: The aortic root and ascending aorta are structurally normal, with no evidence of  dilitation. Venous: The inferior vena cava is normal in size with less than 50% respiratory variability, suggesting right atrial pressure of 8 mmHg. IAS/Shunts: No atrial level shunt detected by color flow Doppler.  LEFT VENTRICLE PLAX 2D LVIDd:         5.10  cm LVIDs:         3.70 cm LV PW:         1.10 cm LV IVS:        1.10 cm LVOT diam:     1.90 cm LV SV:         45 LV SV Index:   21 LVOT Area:     2.84 cm  RIGHT VENTRICLE RV S prime:     9.46 cm/s TAPSE (M-mode): 1.4 cm LEFT ATRIUM           Index        RIGHT ATRIUM           Index LA diam:      4.40 cm 2.05 cm/m   RA Area:     12.80 cm LA Vol (A4C): 39.6 ml 18.41 ml/m  RA Volume:   24.70 ml  11.48 ml/m  AORTIC VALVE LVOT Vmax:   86.80 cm/s LVOT Vmean:  60.500 cm/s LVOT VTI:    0.157 m  AORTA Ao Root diam: 3.30 cm Ao Asc diam:  3.00 cm  SHUNTS Systemic VTI:  0.16 m Systemic Diam: 1.90 cm Carolan Clines Electronically signed by Carolan Clines Signature Date/Time: 11/30/2022/1:48:04 PM    Final    DG Chest Port 1 View  Result Date: 11/30/2022 CLINICAL DATA:  Shortness of breath. EXAM: PORTABLE CHEST 1 VIEW COMPARISON:  November 29, 2022 FINDINGS: Multiple sternal wires are noted. The cardiac silhouette is borderline in size and stable in appearance. Low lung volumes are seen with chronic, moderate severity elevation of the right hemidiaphragm. Mild diffusely increased interstitial lung markings are noted with stable prominence of the pulmonary vasculature. Mild bibasilar atelectasis is seen, right greater than left. No pleural effusion or pneumothorax is identified. Multilevel degenerative changes are seen throughout the thoracic spine. IMPRESSION: 1. Stable pulmonary vascular congestion with a mild component of suspected interstitial edema. 2. Chronic elevation of the right hemidiaphragm with mild bibasilar atelectasis, right greater than left. Electronically Signed   By: Aram Candela M.D.   On: 11/30/2022 02:07   DG Skull 1-3 Views  Result Date:  11/29/2022 CLINICAL DATA:  Foreign body in auricle.  MRI clearance. EXAM: SKULL - 1-3 VIEW COMPARISON:  None Available. FINDINGS: There is no evidence of skull fracture or other focal bone lesions. No radiopaque foreign body is seen. IMPRESSION: Negative. Electronically Signed   By: Thornell Sartorius M.D.   On: 11/29/2022 23:48   MR Lumbar Spine W Wo Contrast  Result Date: 11/29/2022 CLINICAL DATA:  Initial evaluation for concern for spinal epidural abscess. EXAM: MRI CERVICAL, THORACIC AND LUMBAR SPINE WITHOUT AND WITH CONTRAST TECHNIQUE: Multiplanar and multiecho pulse sequences of the cervical spine, to include the craniocervical junction and cervicothoracic junction, and thoracic and lumbar spine, were obtained without and with intravenous contrast. CONTRAST:  10mL GADAVIST GADOBUTROL 1 MMOL/ML IV SOLN COMPARISON:  None Available. FINDINGS: MRI CERVICAL SPINE FINDINGS Alignment: Please note that this is a limited screening sagittal only MRI for screening of possible epidural abscess or other spinal infection. No axial images were performed. Additionally, the provided sequences are degraded by motion artifact. Straightening of the normal cervical lordosis.  No listhesis. Vertebrae: Vertebral body height maintained without acute or chronic fracture. Bone marrow signal intensity within normal limits. No worrisome osseous lesions. No visible evidence for osteomyelitis discitis or septic arthritis. Cord: Grossly normal signal and morphology. No visible epidural abscess or other collection. No definite abnormal enhancement, although the postcontrast sequence  is markedly degraded by motion. Posterior Fossa, vertebral arteries, paraspinal tissues: No acute finding. Disc levels: No significant cervical spondylosis for age. No spinal stenosis. Foramina appear grossly patent. MRI THORACIC SPINE FINDINGS Alignment: Physiologic with preservation of the normal thoracic kyphosis. No listhesis. Vertebrae: Vertebral body  height maintained without acute or chronic fracture. No worrisome osseous lesions. No MRI evidence for osteomyelitis discitis or septic arthritis. Cord: Grossly normal signal and morphology on this motion degraded exam. No visible epidural abscess or other collection. No abnormal enhancement, although the postcontrast sequences are markedly degraded by motion. Paraspinal and other soft tissues: No acute finding. Disc levels: No significant disc pathology within the thoracic spine for age. No spinal stenosis. Foramina appear grossly patent. MRI LUMBAR SPINE FINDINGS Segmentation: Standard. Lowest well-formed disc space labeled L5-S1. Alignment: Chronic bilateral pars defects at L5 with associated 6 mm spondylolisthesis. Alignment otherwise normal with preservation of the normal lumbar lordosis. Vertebrae: Vertebral body height maintained without acute or chronic fracture. Bone marrow signal intensity within normal limits. No worrisome osseous lesions. Degenerative endplate changes with Schmorl's node deformities and mild enhancement noted at the inferior endplates of L1 and L2. No other evidence for osteomyelitis discitis or septic arthritis. Conus medullaris and cauda equina: Conus extends to the T12-L1 level. Conus and cauda equina appear normal. No visible epidural abscess or other collection. Paraspinal and other soft tissues: No acute finding. Disc levels: Mild spondylosis at L1-2 and L2-3 without stenosis. L5-S1: Chronic bilateral pars defects with 6 mm spondylolisthesis. Associated broad posterior pseudo disc bulge/uncovering. No spinal stenosis. Mild bilateral L5 foraminal narrowing. IMPRESSION: 1. Motion degraded exam. Additionally, Please note that this is a limited sagittal only MRI for screening of possible epidural abscess or other spinal infection. No axial images were performed. 2. No MRI evidence for acute infection within the cervical, thoracic, or lumbar spine. No epidural abscess. 3. Chronic  bilateral pars defects at L5 with associated 6 mm spondylolisthesis, with resultant mild bilateral L5 foraminal stenosis. 4. Otherwise, no other significant disc pathology within the cervical, thoracic, or lumbar spine for age. No other stenosis or impingement. Electronically Signed   By: Rise Mu M.D.   On: 11/29/2022 23:45   MR THORACIC SPINE W WO CONTRAST  Result Date: 11/29/2022 CLINICAL DATA:  Initial evaluation for concern for spinal epidural abscess. EXAM: MRI CERVICAL, THORACIC AND LUMBAR SPINE WITHOUT AND WITH CONTRAST TECHNIQUE: Multiplanar and multiecho pulse sequences of the cervical spine, to include the craniocervical junction and cervicothoracic junction, and thoracic and lumbar spine, were obtained without and with intravenous contrast. CONTRAST:  10mL GADAVIST GADOBUTROL 1 MMOL/ML IV SOLN COMPARISON:  None Available. FINDINGS: MRI CERVICAL SPINE FINDINGS Alignment: Please note that this is a limited screening sagittal only MRI for screening of possible epidural abscess or other spinal infection. No axial images were performed. Additionally, the provided sequences are degraded by motion artifact. Straightening of the normal cervical lordosis.  No listhesis. Vertebrae: Vertebral body height maintained without acute or chronic fracture. Bone marrow signal intensity within normal limits. No worrisome osseous lesions. No visible evidence for osteomyelitis discitis or septic arthritis. Cord: Grossly normal signal and morphology. No visible epidural abscess or other collection. No definite abnormal enhancement, although the postcontrast sequence is markedly degraded by motion. Posterior Fossa, vertebral arteries, paraspinal tissues: No acute finding. Disc levels: No significant cervical spondylosis for age. No spinal stenosis. Foramina appear grossly patent. MRI THORACIC SPINE FINDINGS Alignment: Physiologic with preservation of the normal thoracic kyphosis. No listhesis. Vertebrae:  Vertebral body height maintained without acute or chronic fracture. No worrisome osseous lesions. No MRI evidence for osteomyelitis discitis or septic arthritis. Cord: Grossly normal signal and morphology on this motion degraded exam. No visible epidural abscess or other collection. No abnormal enhancement, although the postcontrast sequences are markedly degraded by motion. Paraspinal and other soft tissues: No acute finding. Disc levels: No significant disc pathology within the thoracic spine for age. No spinal stenosis. Foramina appear grossly patent. MRI LUMBAR SPINE FINDINGS Segmentation: Standard. Lowest well-formed disc space labeled L5-S1. Alignment: Chronic bilateral pars defects at L5 with associated 6 mm spondylolisthesis. Alignment otherwise normal with preservation of the normal lumbar lordosis. Vertebrae: Vertebral body height maintained without acute or chronic fracture. Bone marrow signal intensity within normal limits. No worrisome osseous lesions. Degenerative endplate changes with Schmorl's node deformities and mild enhancement noted at the inferior endplates of L1 and L2. No other evidence for osteomyelitis discitis or septic arthritis. Conus medullaris and cauda equina: Conus extends to the T12-L1 level. Conus and cauda equina appear normal. No visible epidural abscess or other collection. Paraspinal and other soft tissues: No acute finding. Disc levels: Mild spondylosis at L1-2 and L2-3 without stenosis. L5-S1: Chronic bilateral pars defects with 6 mm spondylolisthesis. Associated broad posterior pseudo disc bulge/uncovering. No spinal stenosis. Mild bilateral L5 foraminal narrowing. IMPRESSION: 1. Motion degraded exam. Additionally, Please note that this is a limited sagittal only MRI for screening of possible epidural abscess or other spinal infection. No axial images were performed. 2. No MRI evidence for acute infection within the cervical, thoracic, or lumbar spine. No epidural abscess. 3.  Chronic bilateral pars defects at L5 with associated 6 mm spondylolisthesis, with resultant mild bilateral L5 foraminal stenosis. 4. Otherwise, no other significant disc pathology within the cervical, thoracic, or lumbar spine for age. No other stenosis or impingement. Electronically Signed   By: Rise Mu M.D.   On: 11/29/2022 23:45   MR Cervical Spine W and Wo Contrast  Result Date: 11/29/2022 CLINICAL DATA:  Initial evaluation for concern for spinal epidural abscess. EXAM: MRI CERVICAL, THORACIC AND LUMBAR SPINE WITHOUT AND WITH CONTRAST TECHNIQUE: Multiplanar and multiecho pulse sequences of the cervical spine, to include the craniocervical junction and cervicothoracic junction, and thoracic and lumbar spine, were obtained without and with intravenous contrast. CONTRAST:  10mL GADAVIST GADOBUTROL 1 MMOL/ML IV SOLN COMPARISON:  None Available. FINDINGS: MRI CERVICAL SPINE FINDINGS Alignment: Please note that this is a limited screening sagittal only MRI for screening of possible epidural abscess or other spinal infection. No axial images were performed. Additionally, the provided sequences are degraded by motion artifact. Straightening of the normal cervical lordosis.  No listhesis. Vertebrae: Vertebral body height maintained without acute or chronic fracture. Bone marrow signal intensity within normal limits. No worrisome osseous lesions. No visible evidence for osteomyelitis discitis or septic arthritis. Cord: Grossly normal signal and morphology. No visible epidural abscess or other collection. No definite abnormal enhancement, although the postcontrast sequence is markedly degraded by motion. Posterior Fossa, vertebral arteries, paraspinal tissues: No acute finding. Disc levels: No significant cervical spondylosis for age. No spinal stenosis. Foramina appear grossly patent. MRI THORACIC SPINE FINDINGS Alignment: Physiologic with preservation of the normal thoracic kyphosis. No listhesis.  Vertebrae: Vertebral body height maintained without acute or chronic fracture. No worrisome osseous lesions. No MRI evidence for osteomyelitis discitis or septic arthritis. Cord: Grossly normal signal and morphology on this motion degraded exam. No visible epidural abscess or other collection. No abnormal enhancement, although the  postcontrast sequences are markedly degraded by motion. Paraspinal and other soft tissues: No acute finding. Disc levels: No significant disc pathology within the thoracic spine for age. No spinal stenosis. Foramina appear grossly patent. MRI LUMBAR SPINE FINDINGS Segmentation: Standard. Lowest well-formed disc space labeled L5-S1. Alignment: Chronic bilateral pars defects at L5 with associated 6 mm spondylolisthesis. Alignment otherwise normal with preservation of the normal lumbar lordosis. Vertebrae: Vertebral body height maintained without acute or chronic fracture. Bone marrow signal intensity within normal limits. No worrisome osseous lesions. Degenerative endplate changes with Schmorl's node deformities and mild enhancement noted at the inferior endplates of L1 and L2. No other evidence for osteomyelitis discitis or septic arthritis. Conus medullaris and cauda equina: Conus extends to the T12-L1 level. Conus and cauda equina appear normal. No visible epidural abscess or other collection. Paraspinal and other soft tissues: No acute finding. Disc levels: Mild spondylosis at L1-2 and L2-3 without stenosis. L5-S1: Chronic bilateral pars defects with 6 mm spondylolisthesis. Associated broad posterior pseudo disc bulge/uncovering. No spinal stenosis. Mild bilateral L5 foraminal narrowing. IMPRESSION: 1. Motion degraded exam. Additionally, Please note that this is a limited sagittal only MRI for screening of possible epidural abscess or other spinal infection. No axial images were performed. 2. No MRI evidence for acute infection within the cervical, thoracic, or lumbar spine. No epidural  abscess. 3. Chronic bilateral pars defects at L5 with associated 6 mm spondylolisthesis, with resultant mild bilateral L5 foraminal stenosis. 4. Otherwise, no other significant disc pathology within the cervical, thoracic, or lumbar spine for age. No other stenosis or impingement. Electronically Signed   By: Rise Mu M.D.   On: 11/29/2022 23:45   DG Lumbar Spine Complete  Result Date: 11/29/2022 CLINICAL DATA:  Back pain.  Multiple falls. EXAM: LUMBAR SPINE - COMPLETE 4+ VIEW COMPARISON:  CT of the abdomen and pelvis 07/21/2021 FINDINGS: Grade 1 anterolisthesis at L5-S1 associated with bilateral L5 pars defects is stable. The vertebral body heights and alignment are otherwise normal. Mild endplate changes are present at L1-2, L2-3 and L3-4 with some loss of disc height at each of these levels. Atherosclerotic calcifications are again noted in the aorta without aneurysm. IMPRESSION: 1. No acute abnormality. 2. Stable grade 1 anterolisthesis at L5-S1 associated with bilateral L5 pars defects. 3. Multilevel degenerative disc disease. 4. Aortic atherosclerosis. Electronically Signed   By: Marin Roberts M.D.   On: 11/29/2022 19:45   DG Chest 2 View  Result Date: 11/29/2022 CLINICAL DATA:  Fever.  Back pain.  Multiple recent falls. EXAM: CHEST - 2 VIEW COMPARISON:  One-view chest x-ray 07/21/2021 FINDINGS: The heart is enlarged. Moderate pulmonary vascular congestion is present. Chronic elevation of the right hemidiaphragm is noted. Mild bibasilar atelectasis is present. No other focal airspace disease is present. The visualized soft tissues are unremarkable. CABG again noted. IMPRESSION: 1. Cardiomegaly and moderate pulmonary vascular congestion without frank edema. 2. Chronic elevation of the right hemidiaphragm. Electronically Signed   By: Marin Roberts M.D.   On: 11/29/2022 19:44    Pending Labs Unresulted Labs (From admission, onward)     Start     Ordered   12/01/22 0500   Heparin level (unfractionated)  Daily,   R      11/30/22 0608   12/01/22 0500  CBC  Daily,   R      11/30/22 0608   12/01/22 0500  Basic metabolic panel  Tomorrow morning,   R        11/30/22 1045  Vitals/Pain Today's Vitals   11/30/22 1430 11/30/22 1455 11/30/22 1500 11/30/22 1523  BP: (!) 153/83  (!) 169/71   Pulse: 86  82   Resp: (!) 31  (!) 37   Temp:      TempSrc:      SpO2: 96%  97%   Weight:      Height:      PainSc:  0-No pain  5     Isolation Precautions Airborne and Contact precautions  Medications Medications  sodium chloride flush (NS) 0.9 % injection 3 mL (3 mLs Intravenous Given 11/30/22 1032)  acetaminophen (TYLENOL) tablet 1,000 mg (has no administration in time range)  albuterol (PROVENTIL) (2.5 MG/3ML) 0.083% nebulizer solution 2.5 mg (has no administration in time range)  heparin ADULT infusion 100 units/mL (25000 units/246mL) (1,000 Units/hr Intravenous Rate/Dose Verify 11/30/22 1519)  carvedilol (COREG) tablet 6.25 mg (6.25 mg Oral Given 11/30/22 1031)  ezetimibe (ZETIA) tablet 10 mg (10 mg Oral Given 11/30/22 1031)  rosuvastatin (CRESTOR) tablet 40 mg (40 mg Oral Given 11/30/22 0851)  cefTRIAXone (ROCEPHIN) 1 g in sodium chloride 0.9 % 100 mL IVPB (1 g Intravenous New Bag/Given 11/30/22 1701)  perflutren lipid microspheres (DEFINITY) IV suspension (6 mLs Intravenous Given 11/30/22 1229)  sodium chloride 0.9 % bolus 1,000 mL (0 mLs Intravenous Stopped 11/29/22 1748)  ceFEPIme (MAXIPIME) 2 g in sodium chloride 0.9 % 100 mL IVPB (0 g Intravenous Stopped 11/29/22 1748)  metroNIDAZOLE (FLAGYL) IVPB 500 mg (0 mg Intravenous Stopped 11/29/22 1857)  acetaminophen (TYLENOL) tablet 1,000 mg (1,000 mg Oral Given 11/29/22 1620)  vancomycin (VANCOCIN) IVPB 1000 mg/200 mL premix (0 mg Intravenous Stopped 11/30/22 0327)    And  vancomycin (VANCOCIN) IVPB 1000 mg/200 mL premix (0 mg Intravenous Stopped 11/29/22 1754)  metoprolol tartrate (LOPRESSOR)  injection 2.5 mg (2.5 mg Intravenous Given 11/29/22 1857)  gadobutrol (GADAVIST) 1 MMOL/ML injection 10 mL (10 mLs Intravenous Contrast Given 11/29/22 2230)  sodium chloride 0.9 % bolus 1,000 mL (0 mLs Intravenous Stopped 11/30/22 0127)  furosemide (LASIX) injection 20 mg (20 mg Intravenous Given 11/30/22 0146)  aspirin chewable tablet 324 mg (324 mg Oral Given 11/30/22 0543)  furosemide (LASIX) injection 40 mg (40 mg Intravenous Given 11/30/22 0618)  heparin bolus via infusion 4,000 Units (4,000 Units Intravenous Bolus from Bag 11/30/22 0624)  digoxin (LANOXIN) 0.25 MG/ML injection 0.5 mg (0.5 mg Intravenous Given 11/30/22 0851)    Mobility No ambulation since arrived to ED     Focused Assessments Sepsis  R Recommendations: See Admitting Provider Note  Report given to: [id number]

## 2022-11-30 NOTE — Progress Notes (Signed)
Echocardiogram 2D Echocardiogram has been performed.  Warren Lacy Shondell Poulson RDCS 11/30/2022, 12:30 PM

## 2022-12-01 DIAGNOSIS — N10 Acute tubulo-interstitial nephritis: Secondary | ICD-10-CM | POA: Diagnosis not present

## 2022-12-01 LAB — BASIC METABOLIC PANEL
Anion gap: 11 (ref 5–15)
BUN: 31 mg/dL — ABNORMAL HIGH (ref 8–23)
CO2: 21 mmol/L — ABNORMAL LOW (ref 22–32)
Calcium: 8 mg/dL — ABNORMAL LOW (ref 8.9–10.3)
Chloride: 103 mmol/L (ref 98–111)
Creatinine, Ser: 1.47 mg/dL — ABNORMAL HIGH (ref 0.61–1.24)
GFR, Estimated: 48 mL/min — ABNORMAL LOW (ref >60)
Glucose, Bld: 130 mg/dL — ABNORMAL HIGH (ref 70–99)
Potassium: 3.5 mmol/L (ref 3.5–5.1)
Sodium: 135 mmol/L (ref 135–145)

## 2022-12-01 LAB — HEPARIN LEVEL (UNFRACTIONATED)
Heparin Unfractionated: 0.4 [IU]/mL (ref 0.30–0.70)
Heparin Unfractionated: 0.34 [IU]/mL (ref 0.30–0.70)
Heparin Unfractionated: 0.34 [IU]/mL (ref 0.30–0.70)

## 2022-12-01 LAB — URINE CULTURE: Culture: NO GROWTH

## 2022-12-01 LAB — CBC
HCT: 36.8 % — ABNORMAL LOW (ref 39.0–52.0)
Hemoglobin: 12.3 g/dL — ABNORMAL LOW (ref 13.0–17.0)
MCH: 28.3 pg (ref 26.0–34.0)
MCHC: 33.4 g/dL (ref 30.0–36.0)
MCV: 84.8 fL (ref 80.0–100.0)
Platelets: 150 10*3/uL (ref 150–400)
RBC: 4.34 MIL/uL (ref 4.22–5.81)
RDW: 13.6 % (ref 11.5–15.5)
WBC: 9.3 10*3/uL (ref 4.0–10.5)
nRBC: 0 % (ref 0.0–0.2)

## 2022-12-01 MED ORDER — ASPIRIN 81 MG PO TBEC
81.0000 mg | DELAYED_RELEASE_TABLET | Freq: Every day | ORAL | Status: DC
  Administered 2022-12-01 – 2022-12-05 (×5): 81 mg via ORAL
  Filled 2022-12-01 (×5): qty 1

## 2022-12-01 MED ORDER — AMLODIPINE BESYLATE 5 MG PO TABS
5.0000 mg | ORAL_TABLET | Freq: Every day | ORAL | Status: DC
  Administered 2022-12-01 – 2022-12-05 (×5): 5 mg via ORAL
  Filled 2022-12-01 (×5): qty 1

## 2022-12-01 MED ORDER — FUROSEMIDE 10 MG/ML IJ SOLN
40.0000 mg | Freq: Once | INTRAMUSCULAR | Status: AC
  Administered 2022-12-01: 40 mg via INTRAVENOUS
  Filled 2022-12-01: qty 4

## 2022-12-01 NOTE — Progress Notes (Signed)
Rounding Note    Patient Name: Jeffery Barr Date of Encounter: 12/01/2022  [facility] Cardiologist: Armanda Magic, MD    Subjective   Pt appears very stable .  No cardiac concerns    Remains in atrial fib with controlled. V response  His I/O are +    Troponins are +  [phone]  He denies any chest pain ( although he appears to have at lease mild - moderate amount of dementia )   He received IV Lasix today but did not get any significant diuresis at the least as measured by the nursing staff.      Inpatient Medications    Scheduled Meds:  amLODipine  5 mg Oral Daily   aspirin EC  81 mg Oral Daily   carvedilol  6.25 mg Oral BID   ezetimibe  10 mg Oral Daily   rosuvastatin  40 mg Oral Daily   sodium chloride flush  3 mL Intravenous Q12H   Continuous Infusions:  cefTRIAXone (ROCEPHIN)  IV Stopped (11/30/22 1812)   heparin 1,250 Units/hr (12/01/22 0642)   PRN Meds: acetaminophen, albuterol   Vital Signs    Vitals:   12/01/22 0513 12/01/22 0718 12/01/22 1152 12/01/22 1603  BP: 122/75 (!) 158/91 (!) 160/89 136/79  Pulse: 85 84 82 86  Resp: (!) 22 (!) 25 (!) 28 20  Temp: 98.8 F (37.1 C) 99.3 F (37.4 C) 99.6 F (37.6 C) 100.2 F (37.9 C)  TempSrc: Oral Oral Oral Oral  SpO2: 97% 95% 94% 95%  Weight: 98.4 kg     Height:        Intake/Output Summary (Last 24 hours) at 12/01/2022 1643 Last data filed at 12/01/2022 0933 Gross per 24 hour  Intake 572.27 ml  Output 150 ml  Net 422.27 ml      12/01/2022    5:13 AM 11/29/2022    3:47 PM 11/26/2022   10:09 PM  Last 3 Weights  Weight (lbs) 216 lb 14.9 oz 220 lb 220 lb  Weight (kg) 98.4 kg 99.791 kg 99.791 kg      Telemetry    Atrial fib ,   ECG     - Personally Reviewed  Physical Exam   Physical Exam: Blood pressure 136/79, pulse 86, temperature 100.2 F (37.9 C), temperature source Oral, resp. rate 20, height 5\' 9"  (1.753 m), weight 98.4 kg, SpO2 95%.       GEN:   elderly , generally weak man, in no acute distress HEENT: Normal NECK: No JVD; No carotid bruits LYMPHATICS: No lymphadenopathy CARDIAC: irreg. Irreg.   RESPIRATORY:  Clear to auscultation without rales, wheezing or rhonchi  ABDOMEN: Soft, non-tender, non-distended MUSCULOSKELETAL:  No edema; No deformity  SKIN: Warm and dry NEUROLOGIC: at least mild dementia,  generally weak    Labs    High Sensitivity Troponin:   Recent Labs  Lab 11/30/22 0433 11/30/22 0844 11/30/22 1040  TROPONINIHS 2,083* 2,564* 2,041*     Chemistry Recent Labs  Lab 11/29/22 1605 11/30/22 0433 12/01/22 0321  NA 135 136 135  K 3.7 3.7 3.5  CL 98 105 103  CO2 21* 20* 21*  GLUCOSE 134* 146* 130*  BUN 19 18 31*  CREATININE 1.29* 1.49* 1.47*  CALCIUM 8.9 8.2* 8.0*  MG  --  1.9  --   PROT 7.3  --   --   ALBUMIN 3.5  --   --   AST 24  --   --   ALT 16  --   --  ALKPHOS 53  --   --   BILITOT 1.1  --   --   GFRNONAA 56* 47* 48*  ANIONGAP 16* 11 11    Lipids  Recent Labs  Lab 11/30/22 0844  CHOL 135  TRIG 65  HDL 38*  LDLCALC 84  CHOLHDL 3.6    Hematology Recent Labs  Lab 11/29/22 1605 11/30/22 0433 12/01/22 0321  WBC 9.8 10.1 9.3  RBC 4.90 4.26 4.34  HGB 14.2 12.4* 12.3*  HCT 42.1 37.7* 36.8*  MCV 85.9 88.5 84.8  MCH 29.0 29.1 28.3  MCHC 33.7 32.9 33.4  RDW 13.3 13.8 13.6  PLT 154 145* 150   Thyroid No results for input(s): "TSH", "FREET4" in the last 168 hours.  BNP Recent Labs  Lab 11/29/22 1612  BNP 699.5*    DDimer No results for input(s): "DDIMER" in the last 168 hours.   Radiology    ECHOCARDIOGRAM COMPLETE  Result Date: 11/30/2022    ECHOCARDIOGRAM REPORT   Patient Name:   Jeffery Barr Date of Exam: 11/30/2022 Medical Rec #:  [id number]        Height:       69.0 in Accession #:    [id number]       Weight:       220.0 lb Date of Birth:  1940-10-09         BSA:          2.151 m Patient Age:    82 years         BP:           163/75 mmHg Patient Gender: M                 HR:           84 bpm. Exam Location:  Inpatient Procedure: 2D Echo, Color Doppler, Cardiac Doppler and Intracardiac            Opacification Agent Indications:    I50.9* Heart failure (unspecified)  History:        Patient has prior history of Echocardiogram examinations, most                 recent 06/01/2021. CAD, Arrythmias:Atrial Fibrillation; Risk                 Factors:Hypertension and Dyslipidemia.  Sonographer:    Irving Burton Senior RDCS Referring Phys: [id number] Dolly Rias  Sonographer Comments: Technically difficult due to poor echo windows. IMPRESSIONS  1. Left ventricular ejection fraction, by estimation, is 55 to 60%. The left ventricle has normal function. Left ventricular endocardial border not optimally defined to evaluate regional wall motion. There is mild concentric left ventricular hypertrophy. Left ventricular diastolic function could not be evaluated.  2. Right ventricular systolic function is mildly reduced. The right ventricular size is not well visualized. Tricuspid regurgitation signal is inadequate for assessing PA pressure.  3. The mitral valve is normal in structure. Trivial mitral valve regurgitation.  4. The aortic valve has an indeterminant number of cusps. Aortic valve regurgitation is not visualized.  5. The inferior vena cava is normal in size with <50% respiratory variability, suggesting right atrial pressure of 8 mmHg. FINDINGS  Left Ventricle: Left ventricular ejection fraction, by estimation, is 55 to 60%. The left ventricle has normal function. Left ventricular endocardial border not optimally defined to evaluate regional wall motion. Definity contrast agent was given IV to delineate the left ventricular endocardial borders. The left ventricular internal cavity size was normal in  size. There is mild concentric left ventricular hypertrophy. Left ventricular diastolic function could not be evaluated due to atrial fibrillation. Left ventricular diastolic function could not be  evaluated. Right Ventricle: The right ventricular size is not well visualized. Right vetricular wall thickness was not well visualized. Right ventricular systolic function is mildly reduced. Tricuspid regurgitation signal is inadequate for assessing PA pressure. Left Atrium: Left atrial size was normal in size. Right Atrium: Right atrial size was normal in size. Pericardium: There is no evidence of pericardial effusion. Presence of epicardial fat layer. Mitral Valve: The mitral valve is normal in structure. Trivial mitral valve regurgitation. Tricuspid Valve: The tricuspid valve is normal in structure. Tricuspid valve regurgitation is trivial. Aortic Valve: The aortic valve has an indeterminant number of cusps. Aortic valve regurgitation is not visualized. Pulmonic Valve: The pulmonic valve was normal in structure. Pulmonic valve regurgitation is mild. Aorta: The aortic root and ascending aorta are structurally normal, with no evidence of dilitation. Venous: The inferior vena cava is normal in size with less than 50% respiratory variability, suggesting right atrial pressure of 8 mmHg. IAS/Shunts: No atrial level shunt detected by color flow Doppler.  LEFT VENTRICLE PLAX 2D LVIDd:         5.10 cm LVIDs:         3.70 cm LV PW:         1.10 cm LV IVS:        1.10 cm LVOT diam:     1.90 cm LV SV:         45 LV SV Index:   21 LVOT Area:     2.84 cm  RIGHT VENTRICLE RV S prime:     9.46 cm/s TAPSE (M-mode): 1.4 cm LEFT ATRIUM           Index        RIGHT ATRIUM           Index LA diam:      4.40 cm 2.05 cm/m   RA Area:     12.80 cm LA Vol (A4C): 39.6 ml 18.41 ml/m  RA Volume:   24.70 ml  11.48 ml/m  AORTIC VALVE LVOT Vmax:   86.80 cm/s LVOT Vmean:  60.500 cm/s LVOT VTI:    0.157 m  AORTA Ao Root diam: 3.30 cm Ao Asc diam:  3.00 cm  SHUNTS Systemic VTI:  0.16 m Systemic Diam: 1.90 cm Carolan Clines Electronically signed by Carolan Clines Signature Date/Time: 11/30/2022/1:48:04 PM    Final    DG Chest Port 1 View  Result  Date: 11/30/2022 CLINICAL DATA:  Shortness of breath. EXAM: PORTABLE CHEST 1 VIEW COMPARISON:  November 29, 2022 FINDINGS: Multiple sternal wires are noted. The cardiac silhouette is borderline in size and stable in appearance. Low lung volumes are seen with chronic, moderate severity elevation of the right hemidiaphragm. Mild diffusely increased interstitial lung markings are noted with stable prominence of the pulmonary vasculature. Mild bibasilar atelectasis is seen, right greater than left. No pleural effusion or pneumothorax is identified. Multilevel degenerative changes are seen throughout the thoracic spine. IMPRESSION: 1. Stable pulmonary vascular congestion with a mild component of suspected interstitial edema. 2. Chronic elevation of the right hemidiaphragm with mild bibasilar atelectasis, right greater than left. Electronically Signed   By: Aram Candela M.D.   On: 11/30/2022 02:07   DG Skull 1-3 Views  Result Date: 11/29/2022 CLINICAL DATA:  Foreign body in auricle.  MRI clearance. EXAM: SKULL - 1-3 VIEW COMPARISON:  None  Available. FINDINGS: There is no evidence of skull fracture or other focal bone lesions. No radiopaque foreign body is seen. IMPRESSION: Negative. Electronically Signed   By: Thornell Sartorius M.D.   On: 11/29/2022 23:48   MR Lumbar Spine W Wo Contrast  Result Date: 11/29/2022 CLINICAL DATA:  Initial evaluation for concern for spinal epidural abscess. EXAM: MRI CERVICAL, THORACIC AND LUMBAR SPINE WITHOUT AND WITH CONTRAST TECHNIQUE: Multiplanar and multiecho pulse sequences of the cervical spine, to include the craniocervical junction and cervicothoracic junction, and thoracic and lumbar spine, were obtained without and with intravenous contrast. CONTRAST:  10mL GADAVIST GADOBUTROL 1 MMOL/ML IV SOLN COMPARISON:  None Available. FINDINGS: MRI CERVICAL SPINE FINDINGS Alignment: Please note that this is a limited screening sagittal only MRI for screening of possible epidural  abscess or other spinal infection. No axial images were performed. Additionally, the provided sequences are degraded by motion artifact. Straightening of the normal cervical lordosis.  No listhesis. Vertebrae: Vertebral body height maintained without acute or chronic fracture. Bone marrow signal intensity within normal limits. No worrisome osseous lesions. No visible evidence for osteomyelitis discitis or septic arthritis. Cord: Grossly normal signal and morphology. No visible epidural abscess or other collection. No definite abnormal enhancement, although the postcontrast sequence is markedly degraded by motion. Posterior Fossa, vertebral arteries, paraspinal tissues: No acute finding. Disc levels: No significant cervical spondylosis for age. No spinal stenosis. Foramina appear grossly patent. MRI THORACIC SPINE FINDINGS Alignment: Physiologic with preservation of the normal thoracic kyphosis. No listhesis. Vertebrae: Vertebral body height maintained without acute or chronic fracture. No worrisome osseous lesions. No MRI evidence for osteomyelitis discitis or septic arthritis. Cord: Grossly normal signal and morphology on this motion degraded exam. No visible epidural abscess or other collection. No abnormal enhancement, although the postcontrast sequences are markedly degraded by motion. Paraspinal and other soft tissues: No acute finding. Disc levels: No significant disc pathology within the thoracic spine for age. No spinal stenosis. Foramina appear grossly patent. MRI LUMBAR SPINE FINDINGS Segmentation: Standard. Lowest well-formed disc space labeled L5-S1. Alignment: Chronic bilateral pars defects at L5 with associated 6 mm spondylolisthesis. Alignment otherwise normal with preservation of the normal lumbar lordosis. Vertebrae: Vertebral body height maintained without acute or chronic fracture. Bone marrow signal intensity within normal limits. No worrisome osseous lesions. Degenerative endplate changes with  Schmorl's node deformities and mild enhancement noted at the inferior endplates of L1 and L2. No other evidence for osteomyelitis discitis or septic arthritis. Conus medullaris and cauda equina: Conus extends to the T12-L1 level. Conus and cauda equina appear normal. No visible epidural abscess or other collection. Paraspinal and other soft tissues: No acute finding. Disc levels: Mild spondylosis at L1-2 and L2-3 without stenosis. L5-S1: Chronic bilateral pars defects with 6 mm spondylolisthesis. Associated broad posterior pseudo disc bulge/uncovering. No spinal stenosis. Mild bilateral L5 foraminal narrowing. IMPRESSION: 1. Motion degraded exam. Additionally, Please note that this is a limited sagittal only MRI for screening of possible epidural abscess or other spinal infection. No axial images were performed. 2. No MRI evidence for acute infection within the cervical, thoracic, or lumbar spine. No epidural abscess. 3. Chronic bilateral pars defects at L5 with associated 6 mm spondylolisthesis, with resultant mild bilateral L5 foraminal stenosis. 4. Otherwise, no other significant disc pathology within the cervical, thoracic, or lumbar spine for age. No other stenosis or impingement. Electronically Signed   By: Rise Mu M.D.   On: 11/29/2022 23:45   MR THORACIC SPINE W WO CONTRAST  Result  Date: 11/29/2022 CLINICAL DATA:  Initial evaluation for concern for spinal epidural abscess. EXAM: MRI CERVICAL, THORACIC AND LUMBAR SPINE WITHOUT AND WITH CONTRAST TECHNIQUE: Multiplanar and multiecho pulse sequences of the cervical spine, to include the craniocervical junction and cervicothoracic junction, and thoracic and lumbar spine, were obtained without and with intravenous contrast. CONTRAST:  10mL GADAVIST GADOBUTROL 1 MMOL/ML IV SOLN COMPARISON:  None Available. FINDINGS: MRI CERVICAL SPINE FINDINGS Alignment: Please note that this is a limited screening sagittal only MRI for screening of possible  epidural abscess or other spinal infection. No axial images were performed. Additionally, the provided sequences are degraded by motion artifact. Straightening of the normal cervical lordosis.  No listhesis. Vertebrae: Vertebral body height maintained without acute or chronic fracture. Bone marrow signal intensity within normal limits. No worrisome osseous lesions. No visible evidence for osteomyelitis discitis or septic arthritis. Cord: Grossly normal signal and morphology. No visible epidural abscess or other collection. No definite abnormal enhancement, although the postcontrast sequence is markedly degraded by motion. Posterior Fossa, vertebral arteries, paraspinal tissues: No acute finding. Disc levels: No significant cervical spondylosis for age. No spinal stenosis. Foramina appear grossly patent. MRI THORACIC SPINE FINDINGS Alignment: Physiologic with preservation of the normal thoracic kyphosis. No listhesis. Vertebrae: Vertebral body height maintained without acute or chronic fracture. No worrisome osseous lesions. No MRI evidence for osteomyelitis discitis or septic arthritis. Cord: Grossly normal signal and morphology on this motion degraded exam. No visible epidural abscess or other collection. No abnormal enhancement, although the postcontrast sequences are markedly degraded by motion. Paraspinal and other soft tissues: No acute finding. Disc levels: No significant disc pathology within the thoracic spine for age. No spinal stenosis. Foramina appear grossly patent. MRI LUMBAR SPINE FINDINGS Segmentation: Standard. Lowest well-formed disc space labeled L5-S1. Alignment: Chronic bilateral pars defects at L5 with associated 6 mm spondylolisthesis. Alignment otherwise normal with preservation of the normal lumbar lordosis. Vertebrae: Vertebral body height maintained without acute or chronic fracture. Bone marrow signal intensity within normal limits. No worrisome osseous lesions. Degenerative endplate  changes with Schmorl's node deformities and mild enhancement noted at the inferior endplates of L1 and L2. No other evidence for osteomyelitis discitis or septic arthritis. Conus medullaris and cauda equina: Conus extends to the T12-L1 level. Conus and cauda equina appear normal. No visible epidural abscess or other collection. Paraspinal and other soft tissues: No acute finding. Disc levels: Mild spondylosis at L1-2 and L2-3 without stenosis. L5-S1: Chronic bilateral pars defects with 6 mm spondylolisthesis. Associated broad posterior pseudo disc bulge/uncovering. No spinal stenosis. Mild bilateral L5 foraminal narrowing. IMPRESSION: 1. Motion degraded exam. Additionally, Please note that this is a limited sagittal only MRI for screening of possible epidural abscess or other spinal infection. No axial images were performed. 2. No MRI evidence for acute infection within the cervical, thoracic, or lumbar spine. No epidural abscess. 3. Chronic bilateral pars defects at L5 with associated 6 mm spondylolisthesis, with resultant mild bilateral L5 foraminal stenosis. 4. Otherwise, no other significant disc pathology within the cervical, thoracic, or lumbar spine for age. No other stenosis or impingement. Electronically Signed   By: Rise Mu M.D.   On: 11/29/2022 23:45   MR Cervical Spine W and Wo Contrast  Result Date: 11/29/2022 CLINICAL DATA:  Initial evaluation for concern for spinal epidural abscess. EXAM: MRI CERVICAL, THORACIC AND LUMBAR SPINE WITHOUT AND WITH CONTRAST TECHNIQUE: Multiplanar and multiecho pulse sequences of the cervical spine, to include the craniocervical junction and cervicothoracic junction, and thoracic and  lumbar spine, were obtained without and with intravenous contrast. CONTRAST:  10mL GADAVIST GADOBUTROL 1 MMOL/ML IV SOLN COMPARISON:  None Available. FINDINGS: MRI CERVICAL SPINE FINDINGS Alignment: Please note that this is a limited screening sagittal only MRI for screening  of possible epidural abscess or other spinal infection. No axial images were performed. Additionally, the provided sequences are degraded by motion artifact. Straightening of the normal cervical lordosis.  No listhesis. Vertebrae: Vertebral body height maintained without acute or chronic fracture. Bone marrow signal intensity within normal limits. No worrisome osseous lesions. No visible evidence for osteomyelitis discitis or septic arthritis. Cord: Grossly normal signal and morphology. No visible epidural abscess or other collection. No definite abnormal enhancement, although the postcontrast sequence is markedly degraded by motion. Posterior Fossa, vertebral arteries, paraspinal tissues: No acute finding. Disc levels: No significant cervical spondylosis for age. No spinal stenosis. Foramina appear grossly patent. MRI THORACIC SPINE FINDINGS Alignment: Physiologic with preservation of the normal thoracic kyphosis. No listhesis. Vertebrae: Vertebral body height maintained without acute or chronic fracture. No worrisome osseous lesions. No MRI evidence for osteomyelitis discitis or septic arthritis. Cord: Grossly normal signal and morphology on this motion degraded exam. No visible epidural abscess or other collection. No abnormal enhancement, although the postcontrast sequences are markedly degraded by motion. Paraspinal and other soft tissues: No acute finding. Disc levels: No significant disc pathology within the thoracic spine for age. No spinal stenosis. Foramina appear grossly patent. MRI LUMBAR SPINE FINDINGS Segmentation: Standard. Lowest well-formed disc space labeled L5-S1. Alignment: Chronic bilateral pars defects at L5 with associated 6 mm spondylolisthesis. Alignment otherwise normal with preservation of the normal lumbar lordosis. Vertebrae: Vertebral body height maintained without acute or chronic fracture. Bone marrow signal intensity within normal limits. No worrisome osseous lesions. Degenerative  endplate changes with Schmorl's node deformities and mild enhancement noted at the inferior endplates of L1 and L2. No other evidence for osteomyelitis discitis or septic arthritis. Conus medullaris and cauda equina: Conus extends to the T12-L1 level. Conus and cauda equina appear normal. No visible epidural abscess or other collection. Paraspinal and other soft tissues: No acute finding. Disc levels: Mild spondylosis at L1-2 and L2-3 without stenosis. L5-S1: Chronic bilateral pars defects with 6 mm spondylolisthesis. Associated broad posterior pseudo disc bulge/uncovering. No spinal stenosis. Mild bilateral L5 foraminal narrowing. IMPRESSION: 1. Motion degraded exam. Additionally, Please note that this is a limited sagittal only MRI for screening of possible epidural abscess or other spinal infection. No axial images were performed. 2. No MRI evidence for acute infection within the cervical, thoracic, or lumbar spine. No epidural abscess. 3. Chronic bilateral pars defects at L5 with associated 6 mm spondylolisthesis, with resultant mild bilateral L5 foraminal stenosis. 4. Otherwise, no other significant disc pathology within the cervical, thoracic, or lumbar spine for age. No other stenosis or impingement. Electronically Signed   By: Rise Mu M.D.   On: 11/29/2022 23:45   DG Lumbar Spine Complete  Result Date: 11/29/2022 CLINICAL DATA:  Back pain.  Multiple falls. EXAM: LUMBAR SPINE - COMPLETE 4+ VIEW COMPARISON:  CT of the abdomen and pelvis 07/21/2021 FINDINGS: Grade 1 anterolisthesis at L5-S1 associated with bilateral L5 pars defects is stable. The vertebral body heights and alignment are otherwise normal. Mild endplate changes are present at L1-2, L2-3 and L3-4 with some loss of disc height at each of these levels. Atherosclerotic calcifications are again noted in the aorta without aneurysm. IMPRESSION: 1. No acute abnormality. 2. Stable grade 1 anterolisthesis at L5-S1 associated  with  bilateral L5 pars defects. 3. Multilevel degenerative disc disease. 4. Aortic atherosclerosis. Electronically Signed   By: Marin Roberts M.D.   On: 11/29/2022 19:45   DG Chest 2 View  Result Date: 11/29/2022 CLINICAL DATA:  Fever.  Back pain.  Multiple recent falls. EXAM: CHEST - 2 VIEW COMPARISON:  One-view chest x-ray 07/21/2021 FINDINGS: The heart is enlarged. Moderate pulmonary vascular congestion is present. Chronic elevation of the right hemidiaphragm is noted. Mild bibasilar atelectasis is present. No other focal airspace disease is present. The visualized soft tissues are unremarkable. CABG again noted. IMPRESSION: 1. Cardiomegaly and moderate pulmonary vascular congestion without frank edema. 2. Chronic elevation of the right hemidiaphragm. Electronically Signed   By: Marin Roberts M.D.   On: 11/29/2022 19:44    Cardiac Studies    Patient Profile     82 y.o. male    Assessment & Plan      UTI:  pt presents with UTI , profound weakness , no CP  Troponins were incidentally found to be elevated   2.  CAD :  has know cad, CABG .   Troponins are elevated with a flat trend.  Not c/w ACS . This troponin elevation concerning.  He denies any chest pain  His echo from OCt. 12 shows normal LV function At this point, I would be inclined to continue with medical therapy and not pursue an ischemic evaluation unless he has cardiac symptoms       3.  Possible epidural abscess :  plans per primary team           For questions or updates, please contact [facility] Please consult [email or web address] for contact info under        Signed, Kristeen Miss, MD  12/01/2022, 4:43 PM

## 2022-12-01 NOTE — Progress Notes (Addendum)
PROGRESS NOTE    Jeffery Barr  YNW:[id number] DOB: 1940-03-12 DOA: 11/29/2022 PCP: [name], MD   Brief Narrative:  HPI:  Jeffery Barr is a 82 y.o. male with hx of CAD with history CABG, A-fib not on anticoagulation, TIA, hypertension, hyperlipidemia, who was transferred ED to ED from [facility] due to initial concern for possible epidural abscess and for MRI.  Patient had presented with few day history of midline lower back pain, and fevers.  Per his wife has also had generalized weakness, possible rigoring, difficulty getting around.  Today slid out of bed but did not fall or hit his head or sustain other injuries.  He has no numbness or weakness in his lower extremities.  There is no bowel or bladder incontinence.  Does acknowledge recent dysuria.     Denies any history of recent or active chest pain (after his episode of arrhythmia /resp failure) in the ED. does acknowledge recent history of dyspnea on exertion.   Assessment & Plan:   Principal Problem:   Urinary tract infection Active Problems:   NSTEMI (non-ST elevated myocardial infarction) (HCC)   Severe sepsis (HCC)   Hypoxic respiratory failure (HCC)   Acute on chronic diastolic heart failure (HCC)   Atrial fibrillation with RVR (HCC)   Acute hypoxic respiratory failure (HCC)  Severe sepsis secondary to UTI, POA: Patient meets criteria for severe sepsis based on fever, tachycardia, tachypnea and endorgan dysfunction such as AKI.  Has been started on vancomycin and cefepime.  No other source of infection other than UTI and thus antibiotics were narrowed to Rocephin.  All the cultures are negative so far however patient has been having persistent fever with last temperature spike of 101.5 at midnight 12/01/2022 but no leukocytosis.  Patient clinically improving.  Will obtain CT abdomen pelvis to rule out pyelo if continues to have fever.  Acute hypoxic respiratory failure secondary to acute on chronic  congestive heart failure with preserved ejection fraction: Patient required BiPAP for a brief period of time in the ED.  Currently weaned down to 2 L of oxygen.  Chest x-ray with pulmonary edema with elevated BNP.  Echo with normal ejection fraction and no wall motion abnormality.  Patient received couple of doses of Lasix in the ED.  Per initial/consulting cardiology note, they were going to start him on Lasix twice but no mention of management of CHF and cardiology's note from yesterday.  He does appear to be fluid overloaded with crackles on my exam.  I have given him 1 dose of Lasix 40 mg.  I have discussed with Dr. Elease Hashimoto in person myself.  NSTEMI: Significant elevated troponin around 2000s, in my opinion, he does meet criteria for NSTEMI.  Discussed with Dr. Elease Hashimoto for this as well.  Patient may benefit from cardiac cath.  Will defer to cardiology for that.  I have resumed his low-dose aspirin.  Tachyarrhythmia, MAT vs atrial fibrillation: Previous history of paroxysmal atrial fibrillation during episode of COVID, reportedly was on [facility] previously but could not afford.  Appears to have atrial fibrillation now.  Currently on heparin.  Was given 1 dose of digoxin on 11/29/2022, nothing after that.  Has been started on Coreg.  Rates appear to be controlled.  Management per cardiology.   Midline back pain: Initial concern was possible epidural abscess.  However MRI ruled out any signs of infection.  Continue current pain management.  PT OT.  History of hypertension: Blood pressure now elevated, will  NEGATIVE NEGATIVE Final    Comment: (NOTE) Fact Sheet for Patients: BloggerCourse.com  Fact Sheet for Healthcare Providers: SeriousBroker.it  This test is not yet approved or cleared by the [place] FDA and has been authorized for detection and/or  diagnosis of SARS-CoV-2 by FDA under an Emergency Use Authorization (EUA). This EUA will remain in effect (meaning this test can be used) for the duration of the COVID-19 declaration under Section 564(b)(1) of the Act, 21 U.S.C. section 360bbb-3(b)(1), unless the authorization is terminated or revoked.  Performed at [facility], [address] [place], [place] [address]   Blood Culture (routine x 2)     Status: None (Preliminary result)   Collection Time: 11/29/22  4:26 PM   Specimen: BLOOD  Result Value Ref Range Status   Specimen Description   Final    BLOOD LEFT ANTECUBITAL Performed at [facility], [address] [place], [place] [address]    Special Requests   Final    BOTTLES DRAWN AEROBIC AND ANAEROBIC Blood Culture results may not be optimal due to an inadequate volume of blood received in culture bottles Performed at [facility], [address] [place], [place] [address]    Culture   Final    NO GROWTH 2 DAYS Performed at [facility], [address] [place], [place] [address]    Report Status PENDING  Incomplete  SARS Coronavirus 2 by RT PCR (hospital order, performed in [facility] hospital lab) *cepheid single result test* Anterior Nasal Swab     Status: None   Collection Time: 11/30/22 12:03 AM   Specimen: Anterior Nasal Swab  Result Value Ref Range Status   SARS Coronavirus 2 by RT PCR NEGATIVE NEGATIVE Final    Comment: Performed at [facility], [address], [place], [place] [address]     Radiology Studies: ECHOCARDIOGRAM COMPLETE  Result Date: 11/30/2022    ECHOCARDIOGRAM REPORT   Patient Name:   Jeffery Barr Date of Exam: 11/30/2022 Medical Rec #:  [id number]        Height:       69.0 in Accession #:    [id number]       Weight:       220.0 lb Date of Birth:  05/18/1940         BSA:          2.151 m Patient Age:    82 years         BP:           163/75 mmHg Patient Gender: M                HR:            84 bpm. Exam Location:  Inpatient Procedure: 2D Echo, Color Doppler, Cardiac Doppler and Intracardiac            Opacification Agent Indications:    I50.9* Heart failure (unspecified)  History:        Patient has prior history of Echocardiogram examinations, most                 recent 06/01/2021. CAD, Arrythmias:Atrial Fibrillation; Risk                 Factors:Hypertension and Dyslipidemia.  Sonographer:    Irving Burton Senior RDCS Referring Phys: [id number] Dolly Rias  Sonographer Comments: Technically difficult due to poor echo windows. IMPRESSIONS  1. Left ventricular ejection  NEGATIVE NEGATIVE Final    Comment: (NOTE) Fact Sheet for Patients: BloggerCourse.com  Fact Sheet for Healthcare Providers: SeriousBroker.it  This test is not yet approved or cleared by the [place] FDA and has been authorized for detection and/or  diagnosis of SARS-CoV-2 by FDA under an Emergency Use Authorization (EUA). This EUA will remain in effect (meaning this test can be used) for the duration of the COVID-19 declaration under Section 564(b)(1) of the Act, 21 U.S.C. section 360bbb-3(b)(1), unless the authorization is terminated or revoked.  Performed at [facility], [address] [place], [place] [address]   Blood Culture (routine x 2)     Status: None (Preliminary result)   Collection Time: 11/29/22  4:26 PM   Specimen: BLOOD  Result Value Ref Range Status   Specimen Description   Final    BLOOD LEFT ANTECUBITAL Performed at [facility], [address] [place], [place] [address]    Special Requests   Final    BOTTLES DRAWN AEROBIC AND ANAEROBIC Blood Culture results may not be optimal due to an inadequate volume of blood received in culture bottles Performed at [facility], [address] [place], [place] [address]    Culture   Final    NO GROWTH 2 DAYS Performed at [facility], [address] [place], [place] [address]    Report Status PENDING  Incomplete  SARS Coronavirus 2 by RT PCR (hospital order, performed in [facility] hospital lab) *cepheid single result test* Anterior Nasal Swab     Status: None   Collection Time: 11/30/22 12:03 AM   Specimen: Anterior Nasal Swab  Result Value Ref Range Status   SARS Coronavirus 2 by RT PCR NEGATIVE NEGATIVE Final    Comment: Performed at [facility], [address], [place], [place] [address]     Radiology Studies: ECHOCARDIOGRAM COMPLETE  Result Date: 11/30/2022    ECHOCARDIOGRAM REPORT   Patient Name:   Jeffery Barr Date of Exam: 11/30/2022 Medical Rec #:  [id number]        Height:       69.0 in Accession #:    [id number]       Weight:       220.0 lb Date of Birth:  05/18/1940         BSA:          2.151 m Patient Age:    82 years         BP:           163/75 mmHg Patient Gender: M                HR:            84 bpm. Exam Location:  Inpatient Procedure: 2D Echo, Color Doppler, Cardiac Doppler and Intracardiac            Opacification Agent Indications:    I50.9* Heart failure (unspecified)  History:        Patient has prior history of Echocardiogram examinations, most                 recent 06/01/2021. CAD, Arrythmias:Atrial Fibrillation; Risk                 Factors:Hypertension and Dyslipidemia.  Sonographer:    Irving Burton Senior RDCS Referring Phys: [id number] Dolly Rias  Sonographer Comments: Technically difficult due to poor echo windows. IMPRESSIONS  1. Left ventricular ejection  PROGRESS NOTE    Jeffery Barr  YNW:[id number] DOB: 1940-03-12 DOA: 11/29/2022 PCP: [name], MD   Brief Narrative:  HPI:  Jeffery Barr is a 82 y.o. male with hx of CAD with history CABG, A-fib not on anticoagulation, TIA, hypertension, hyperlipidemia, who was transferred ED to ED from [facility] due to initial concern for possible epidural abscess and for MRI.  Patient had presented with few day history of midline lower back pain, and fevers.  Per his wife has also had generalized weakness, possible rigoring, difficulty getting around.  Today slid out of bed but did not fall or hit his head or sustain other injuries.  He has no numbness or weakness in his lower extremities.  There is no bowel or bladder incontinence.  Does acknowledge recent dysuria.     Denies any history of recent or active chest pain (after his episode of arrhythmia /resp failure) in the ED. does acknowledge recent history of dyspnea on exertion.   Assessment & Plan:   Principal Problem:   Urinary tract infection Active Problems:   NSTEMI (non-ST elevated myocardial infarction) (HCC)   Severe sepsis (HCC)   Hypoxic respiratory failure (HCC)   Acute on chronic diastolic heart failure (HCC)   Atrial fibrillation with RVR (HCC)   Acute hypoxic respiratory failure (HCC)  Severe sepsis secondary to UTI, POA: Patient meets criteria for severe sepsis based on fever, tachycardia, tachypnea and endorgan dysfunction such as AKI.  Has been started on vancomycin and cefepime.  No other source of infection other than UTI and thus antibiotics were narrowed to Rocephin.  All the cultures are negative so far however patient has been having persistent fever with last temperature spike of 101.5 at midnight 12/01/2022 but no leukocytosis.  Patient clinically improving.  Will obtain CT abdomen pelvis to rule out pyelo if continues to have fever.  Acute hypoxic respiratory failure secondary to acute on chronic  congestive heart failure with preserved ejection fraction: Patient required BiPAP for a brief period of time in the ED.  Currently weaned down to 2 L of oxygen.  Chest x-ray with pulmonary edema with elevated BNP.  Echo with normal ejection fraction and no wall motion abnormality.  Patient received couple of doses of Lasix in the ED.  Per initial/consulting cardiology note, they were going to start him on Lasix twice but no mention of management of CHF and cardiology's note from yesterday.  He does appear to be fluid overloaded with crackles on my exam.  I have given him 1 dose of Lasix 40 mg.  I have discussed with Dr. Elease Hashimoto in person myself.  NSTEMI: Significant elevated troponin around 2000s, in my opinion, he does meet criteria for NSTEMI.  Discussed with Dr. Elease Hashimoto for this as well.  Patient may benefit from cardiac cath.  Will defer to cardiology for that.  I have resumed his low-dose aspirin.  Tachyarrhythmia, MAT vs atrial fibrillation: Previous history of paroxysmal atrial fibrillation during episode of COVID, reportedly was on [facility] previously but could not afford.  Appears to have atrial fibrillation now.  Currently on heparin.  Was given 1 dose of digoxin on 11/29/2022, nothing after that.  Has been started on Coreg.  Rates appear to be controlled.  Management per cardiology.   Midline back pain: Initial concern was possible epidural abscess.  However MRI ruled out any signs of infection.  Continue current pain management.  PT OT.  History of hypertension: Blood pressure now elevated, will  PROGRESS NOTE    Jeffery Barr  YNW:[id number] DOB: 1940-03-12 DOA: 11/29/2022 PCP: [name], MD   Brief Narrative:  HPI:  Jeffery Barr is a 82 y.o. male with hx of CAD with history CABG, A-fib not on anticoagulation, TIA, hypertension, hyperlipidemia, who was transferred ED to ED from [facility] due to initial concern for possible epidural abscess and for MRI.  Patient had presented with few day history of midline lower back pain, and fevers.  Per his wife has also had generalized weakness, possible rigoring, difficulty getting around.  Today slid out of bed but did not fall or hit his head or sustain other injuries.  He has no numbness or weakness in his lower extremities.  There is no bowel or bladder incontinence.  Does acknowledge recent dysuria.     Denies any history of recent or active chest pain (after his episode of arrhythmia /resp failure) in the ED. does acknowledge recent history of dyspnea on exertion.   Assessment & Plan:   Principal Problem:   Urinary tract infection Active Problems:   NSTEMI (non-ST elevated myocardial infarction) (HCC)   Severe sepsis (HCC)   Hypoxic respiratory failure (HCC)   Acute on chronic diastolic heart failure (HCC)   Atrial fibrillation with RVR (HCC)   Acute hypoxic respiratory failure (HCC)  Severe sepsis secondary to UTI, POA: Patient meets criteria for severe sepsis based on fever, tachycardia, tachypnea and endorgan dysfunction such as AKI.  Has been started on vancomycin and cefepime.  No other source of infection other than UTI and thus antibiotics were narrowed to Rocephin.  All the cultures are negative so far however patient has been having persistent fever with last temperature spike of 101.5 at midnight 12/01/2022 but no leukocytosis.  Patient clinically improving.  Will obtain CT abdomen pelvis to rule out pyelo if continues to have fever.  Acute hypoxic respiratory failure secondary to acute on chronic  congestive heart failure with preserved ejection fraction: Patient required BiPAP for a brief period of time in the ED.  Currently weaned down to 2 L of oxygen.  Chest x-ray with pulmonary edema with elevated BNP.  Echo with normal ejection fraction and no wall motion abnormality.  Patient received couple of doses of Lasix in the ED.  Per initial/consulting cardiology note, they were going to start him on Lasix twice but no mention of management of CHF and cardiology's note from yesterday.  He does appear to be fluid overloaded with crackles on my exam.  I have given him 1 dose of Lasix 40 mg.  I have discussed with Dr. Elease Hashimoto in person myself.  NSTEMI: Significant elevated troponin around 2000s, in my opinion, he does meet criteria for NSTEMI.  Discussed with Dr. Elease Hashimoto for this as well.  Patient may benefit from cardiac cath.  Will defer to cardiology for that.  I have resumed his low-dose aspirin.  Tachyarrhythmia, MAT vs atrial fibrillation: Previous history of paroxysmal atrial fibrillation during episode of COVID, reportedly was on [facility] previously but could not afford.  Appears to have atrial fibrillation now.  Currently on heparin.  Was given 1 dose of digoxin on 11/29/2022, nothing after that.  Has been started on Coreg.  Rates appear to be controlled.  Management per cardiology.   Midline back pain: Initial concern was possible epidural abscess.  However MRI ruled out any signs of infection.  Continue current pain management.  PT OT.  History of hypertension: Blood pressure now elevated, will  PROGRESS NOTE    Jeffery Barr  YNW:[id number] DOB: 1940-03-12 DOA: 11/29/2022 PCP: [name], MD   Brief Narrative:  HPI:  Jeffery Barr is a 82 y.o. male with hx of CAD with history CABG, A-fib not on anticoagulation, TIA, hypertension, hyperlipidemia, who was transferred ED to ED from [facility] due to initial concern for possible epidural abscess and for MRI.  Patient had presented with few day history of midline lower back pain, and fevers.  Per his wife has also had generalized weakness, possible rigoring, difficulty getting around.  Today slid out of bed but did not fall or hit his head or sustain other injuries.  He has no numbness or weakness in his lower extremities.  There is no bowel or bladder incontinence.  Does acknowledge recent dysuria.     Denies any history of recent or active chest pain (after his episode of arrhythmia /resp failure) in the ED. does acknowledge recent history of dyspnea on exertion.   Assessment & Plan:   Principal Problem:   Urinary tract infection Active Problems:   NSTEMI (non-ST elevated myocardial infarction) (HCC)   Severe sepsis (HCC)   Hypoxic respiratory failure (HCC)   Acute on chronic diastolic heart failure (HCC)   Atrial fibrillation with RVR (HCC)   Acute hypoxic respiratory failure (HCC)  Severe sepsis secondary to UTI, POA: Patient meets criteria for severe sepsis based on fever, tachycardia, tachypnea and endorgan dysfunction such as AKI.  Has been started on vancomycin and cefepime.  No other source of infection other than UTI and thus antibiotics were narrowed to Rocephin.  All the cultures are negative so far however patient has been having persistent fever with last temperature spike of 101.5 at midnight 12/01/2022 but no leukocytosis.  Patient clinically improving.  Will obtain CT abdomen pelvis to rule out pyelo if continues to have fever.  Acute hypoxic respiratory failure secondary to acute on chronic  congestive heart failure with preserved ejection fraction: Patient required BiPAP for a brief period of time in the ED.  Currently weaned down to 2 L of oxygen.  Chest x-ray with pulmonary edema with elevated BNP.  Echo with normal ejection fraction and no wall motion abnormality.  Patient received couple of doses of Lasix in the ED.  Per initial/consulting cardiology note, they were going to start him on Lasix twice but no mention of management of CHF and cardiology's note from yesterday.  He does appear to be fluid overloaded with crackles on my exam.  I have given him 1 dose of Lasix 40 mg.  I have discussed with Dr. Elease Hashimoto in person myself.  NSTEMI: Significant elevated troponin around 2000s, in my opinion, he does meet criteria for NSTEMI.  Discussed with Dr. Elease Hashimoto for this as well.  Patient may benefit from cardiac cath.  Will defer to cardiology for that.  I have resumed his low-dose aspirin.  Tachyarrhythmia, MAT vs atrial fibrillation: Previous history of paroxysmal atrial fibrillation during episode of COVID, reportedly was on [facility] previously but could not afford.  Appears to have atrial fibrillation now.  Currently on heparin.  Was given 1 dose of digoxin on 11/29/2022, nothing after that.  Has been started on Coreg.  Rates appear to be controlled.  Management per cardiology.   Midline back pain: Initial concern was possible epidural abscess.  However MRI ruled out any signs of infection.  Continue current pain management.  PT OT.  History of hypertension: Blood pressure now elevated, will  NEGATIVE NEGATIVE Final    Comment: (NOTE) Fact Sheet for Patients: BloggerCourse.com  Fact Sheet for Healthcare Providers: SeriousBroker.it  This test is not yet approved or cleared by the [place] FDA and has been authorized for detection and/or  diagnosis of SARS-CoV-2 by FDA under an Emergency Use Authorization (EUA). This EUA will remain in effect (meaning this test can be used) for the duration of the COVID-19 declaration under Section 564(b)(1) of the Act, 21 U.S.C. section 360bbb-3(b)(1), unless the authorization is terminated or revoked.  Performed at [facility], [address] [place], [place] [address]   Blood Culture (routine x 2)     Status: None (Preliminary result)   Collection Time: 11/29/22  4:26 PM   Specimen: BLOOD  Result Value Ref Range Status   Specimen Description   Final    BLOOD LEFT ANTECUBITAL Performed at [facility], [address] [place], [place] [address]    Special Requests   Final    BOTTLES DRAWN AEROBIC AND ANAEROBIC Blood Culture results may not be optimal due to an inadequate volume of blood received in culture bottles Performed at [facility], [address] [place], [place] [address]    Culture   Final    NO GROWTH 2 DAYS Performed at [facility], [address] [place], [place] [address]    Report Status PENDING  Incomplete  SARS Coronavirus 2 by RT PCR (hospital order, performed in [facility] hospital lab) *cepheid single result test* Anterior Nasal Swab     Status: None   Collection Time: 11/30/22 12:03 AM   Specimen: Anterior Nasal Swab  Result Value Ref Range Status   SARS Coronavirus 2 by RT PCR NEGATIVE NEGATIVE Final    Comment: Performed at [facility], [address], [place], [place] [address]     Radiology Studies: ECHOCARDIOGRAM COMPLETE  Result Date: 11/30/2022    ECHOCARDIOGRAM REPORT   Patient Name:   Jeffery Barr Date of Exam: 11/30/2022 Medical Rec #:  [id number]        Height:       69.0 in Accession #:    [id number]       Weight:       220.0 lb Date of Birth:  05/18/1940         BSA:          2.151 m Patient Age:    82 years         BP:           163/75 mmHg Patient Gender: M                HR:            84 bpm. Exam Location:  Inpatient Procedure: 2D Echo, Color Doppler, Cardiac Doppler and Intracardiac            Opacification Agent Indications:    I50.9* Heart failure (unspecified)  History:        Patient has prior history of Echocardiogram examinations, most                 recent 06/01/2021. CAD, Arrythmias:Atrial Fibrillation; Risk                 Factors:Hypertension and Dyslipidemia.  Sonographer:    Irving Burton Senior RDCS Referring Phys: [id number] Dolly Rias  Sonographer Comments: Technically difficult due to poor echo windows. IMPRESSIONS  1. Left ventricular ejection  NEGATIVE NEGATIVE Final    Comment: (NOTE) Fact Sheet for Patients: BloggerCourse.com  Fact Sheet for Healthcare Providers: SeriousBroker.it  This test is not yet approved or cleared by the [place] FDA and has been authorized for detection and/or  diagnosis of SARS-CoV-2 by FDA under an Emergency Use Authorization (EUA). This EUA will remain in effect (meaning this test can be used) for the duration of the COVID-19 declaration under Section 564(b)(1) of the Act, 21 U.S.C. section 360bbb-3(b)(1), unless the authorization is terminated or revoked.  Performed at [facility], [address] [place], [place] [address]   Blood Culture (routine x 2)     Status: None (Preliminary result)   Collection Time: 11/29/22  4:26 PM   Specimen: BLOOD  Result Value Ref Range Status   Specimen Description   Final    BLOOD LEFT ANTECUBITAL Performed at [facility], [address] [place], [place] [address]    Special Requests   Final    BOTTLES DRAWN AEROBIC AND ANAEROBIC Blood Culture results may not be optimal due to an inadequate volume of blood received in culture bottles Performed at [facility], [address] [place], [place] [address]    Culture   Final    NO GROWTH 2 DAYS Performed at [facility], [address] [place], [place] [address]    Report Status PENDING  Incomplete  SARS Coronavirus 2 by RT PCR (hospital order[facility] [facility]) *cepheid single result test* Anterior Nasal Swab     Status: None   Collection Time: 11/30/22 12:03 AM   Specimen: Anterior Nasal Swab  Result Value Ref Range Status   SARS Coronavirus 2 by RT PCR NEGATIVE NEGATIVE Final    Comment: Performed at [facility], [address], [place], [place] [address]     Radiology Studies: ECHOCARDIOGRAM COMPLETE  Result Date: 11/30/2022    ECHOCARDIOGRAM REPORT   Patient Name:   Jeffery Barr Date of Exam: 11/30/2022 Medical Rec #:  [id number]        Height:       69.0 in Accession #:    [id number]       Weight:       220.0 lb Date of Birth:  05/18/1940         BSA:          2.151 m Patient Age:    82 years         BP:           163/75 mmHg Patient Gender: M                HR:            84 bpm. Exam Location:  Inpatient Procedure: 2D Echo, Color Doppler, Cardiac Doppler and Intracardiac            Opacification Agent Indications:    I50.9* Heart failure (unspecified)  History:        Patient has prior history of Echocardiogram examinations, most                 recent 06/01/2021. CAD, Arrythmias:Atrial Fibrillation; Risk                 Factors:Hypertension and Dyslipidemia.  Sonographer:    Irving Burton Senior RDCS Referring Phys: [id number] Dolly Rias  Sonographer Comments: Technically difficult due to poor echo windows. IMPRESSIONS  1. Left ventricular ejection  NEGATIVE NEGATIVE Final    Comment: (NOTE) Fact Sheet for Patients: BloggerCourse.com  Fact Sheet for Healthcare Providers: SeriousBroker.it  This test is not yet approved or cleared by the [place] FDA and has been authorized for detection and/or  diagnosis of SARS-CoV-2 by FDA under an Emergency Use Authorization (EUA). This EUA will remain in effect (meaning this test can be used) for the duration of the COVID-19 declaration under Section 564(b)(1) of the Act, 21 U.S.C. section 360bbb-3(b)(1), unless the authorization is terminated or revoked.  Performed at [facility], [address] [place], [place] [address]   Blood Culture (routine x 2)     Status: None (Preliminary result)   Collection Time: 11/29/22  4:26 PM   Specimen: BLOOD  Result Value Ref Range Status   Specimen Description   Final    BLOOD LEFT ANTECUBITAL Performed at [facility], [address] [place], [place] [address]    Special Requests   Final    BOTTLES DRAWN AEROBIC AND ANAEROBIC Blood Culture results may not be optimal due to an inadequate volume of blood received in culture bottles Performed at [facility], [address] [place], [place] [address]    Culture   Final    NO GROWTH 2 DAYS Performed at [facility], [address] [place], [place] [address]    Report Status PENDING  Incomplete  SARS Coronavirus 2 by RT PCR (hospital order, performed in [facility] lab) *cepheid single result test* Anterior Nasal Swab     Status: None   Collection Time: 11/30/22 12:03 AM   Specimen: Anterior Nasal Swab  Result Value Ref Range Status   SARS Coronavirus 2 by RT PCR NEGATIVE NEGATIVE Final    Comment: Performed at [facility], [address], [place], [place] [address]     Radiology Studies: ECHOCARDIOGRAM COMPLETE  Result Date: 11/30/2022    ECHOCARDIOGRAM REPORT   Patient Name:   Jeffery Barr Date of Exam: 11/30/2022 Medical Rec #:  [id number]        Height:       69.0 in Accession #:    [id number]       Weight:       220.0 lb Date of Birth:  05/18/1940         BSA:          2.151 m Patient Age:    82 years         BP:           163/75 mmHg Patient Gender: M                HR:            84 bpm. Exam Location:  Inpatient Procedure: 2D Echo, Color Doppler, Cardiac Doppler and Intracardiac            Opacification Agent Indications:    I50.9* Heart failure (unspecified)  History:        Patient has prior history of Echocardiogram examinations, most                 recent 06/01/2021. CAD, Arrythmias:Atrial Fibrillation; Risk                 Factors:Hypertension and Dyslipidemia.  Sonographer:    Irving Burton Senior RDCS Referring Phys: [id number] Dolly Rias  Sonographer Comments: Technically difficult due to poor echo windows. IMPRESSIONS  1. Left ventricular ejection  NEGATIVE NEGATIVE Final    Comment: (NOTE) Fact Sheet for Patients: BloggerCourse.com  Fact Sheet for Healthcare Providers: SeriousBroker.it  This test is not yet approved or cleared by the [place] FDA and has been authorized for detection and/or  diagnosis of SARS-CoV-2 by FDA under an Emergency Use Authorization (EUA). This EUA will remain in effect (meaning this test can be used) for the duration of the COVID-19 declaration under Section 564(b)(1) of the Act, 21 U.S.C. section 360bbb-3(b)(1), unless the authorization is terminated or revoked.  Performed at [facility], [address] [place], [place] [address]   Blood Culture (routine x 2)     Status: None (Preliminary result)   Collection Time: 11/29/22  4:26 PM   Specimen: BLOOD  Result Value Ref Range Status   Specimen Description   Final    BLOOD LEFT ANTECUBITAL Performed at [facility], [address] [place], [place] [address]    Special Requests   Final    BOTTLES DRAWN AEROBIC AND ANAEROBIC Blood Culture results may not be optimal due to an inadequate volume of blood received in culture bottles Performed at [facility], [address] [place], [place] [address]    Culture   Final    NO GROWTH 2 DAYS Performed at [facility], [address] [place], [place] [address]    Report Status PENDING  Incomplete  SARS Coronavirus 2 by RT PCR (hospital order, performed in [facility] lab) *cepheid single result test* Anterior Nasal Swab     Status: None   Collection Time: 11/30/22 12:03 AM   Specimen: Anterior Nasal Swab  Result Value Ref Range Status   SARS Coronavirus 2 by RT PCR NEGATIVE NEGATIVE Final    Comment: Performed at [facility], [address], [place], [place] [address]     Radiology Studies: ECHOCARDIOGRAM COMPLETE  Result Date: 11/30/2022    ECHOCARDIOGRAM REPORT   Patient Name:   Jeffery Barr Date of Exam: 11/30/2022 Medical Rec #:  [id number]        Height:       69.0 in Accession #:    [id number]       Weight:       220.0 lb Date of Birth:  05/18/1940         BSA:          2.151 m Patient Age:    82 years         BP:           163/75 mmHg Patient Gender: M                HR:            84 bpm. Exam Location:  Inpatient Procedure: 2D Echo, Color Doppler, Cardiac Doppler and Intracardiac            Opacification Agent Indications:    I50.9* Heart failure (unspecified)  History:        Patient has prior history of Echocardiogram examinations, most                 recent 06/01/2021. CAD, Arrythmias:Atrial Fibrillation; Risk                 Factors:Hypertension and Dyslipidemia.  Sonographer:    Irving Burton Senior RDCS Referring Phys: [id number] Dolly Rias  Sonographer Comments: Technically difficult due to poor echo windows. IMPRESSIONS  1. Left ventricular ejection

## 2022-12-01 NOTE — Progress Notes (Signed)
ANTICOAGULATION CONSULT NOTE - Follow-up Note  Pharmacy Consult for Heparin Indication: chest pain/ACS  No Known Allergies  Patient Measurements: Height: 5\' 9"  (175.3 cm) Weight: 99.8 kg (220 lb) IBW/kg (Calculated) : 70.7 Heparin Dosing Weight: 91.8 kg  Vital Signs: Temp: 101.5 F (38.6 C) (10/13 0043) Temp Source: Oral (10/13 0043) BP: 143/65 (10/13 0043) Pulse Rate: 92 (10/13 0043)  Labs: Recent Labs    11/29/22 1605 11/30/22 0433 11/30/22 0844 11/30/22 1040 11/30/22 1700 12/01/22 0321  HGB 14.2 12.4*  --   --   --  12.3*  HCT 42.1 37.7*  --   --   --  36.8*  PLT 154 145*  --   --   --  150  HEPARINUNFRC  --   --   --   --  0.18* 0.40  CREATININE 1.29* 1.49*  --   --   --   --   TROPONINIHS  --  2,083* 2,564* 2,041*  --   --     Estimated Creatinine Clearance: 46 mL/min (A) (by C-G formula based on SCr of 1.49 mg/dL (H)).   Medical History: Past Medical History:  Diagnosis Date   Benign hypertensive heart disease without heart failure    Coronary atherosclerosis of native coronary artery    S/P CABG   Dyslipidemia    HTN (hypertension)    Obesity    Sinus bradycardia    a. baseline HR 50s.    Medications:  Medications Prior to Admission  Medication Sig Dispense Refill Last Dose   amLODipine (NORVASC) 5 MG tablet Take 1 tablet (5 mg total) by mouth daily. 180 tablet 3 11/28/2022   aspirin EC 81 MG tablet Take 81 mg by mouth daily. Swallow whole.   11/28/2022   carvedilol (COREG) 6.25 MG tablet Take 1 tablet (6.25 mg total) by mouth 2 (two) times daily. Replacing the 3.125 mg 180 tablet 3 11/28/2022   chlorthalidone (HYGROTON) 25 MG tablet Take 1 tablet (25 mg total) by mouth daily. 90 tablet 3 11/28/2022   ezetimibe (ZETIA) 10 MG tablet Take 1 tablet (10 mg total) by mouth daily. 90 tablet 3 11/28/2022   rosuvastatin (CRESTOR) 20 MG tablet Take 1 tablet (20 mg total) by mouth daily. 90 tablet 3 11/28/2022   Scheduled:   carvedilol  6.25 mg Oral BID    ezetimibe  10 mg Oral Daily   rosuvastatin  40 mg Oral Daily   sodium chloride flush  3 mL Intravenous Q12H   Infusions:   cefTRIAXone (ROCEPHIN)  IV Stopped (11/30/22 1812)   heparin 1,250 Units/hr (12/01/22 0035)   PRN: acetaminophen, albuterol  Assessment: 82 yom with a history of CAD w/ hx of CABG, AF, TIA, HLD. Heparin per pharmacy consult placed for chest pain/ACS.  10/13 AM: heparin level therapeutic on 1250 units/hr. No issues with infusion or bleeding per RN. Hgb 12.3; plt 150  Goal of Therapy:  Heparin level 0.3-0.7 units/ml Monitor platelets by anticoagulation protocol: Yes   Plan:  Continue heparin infusion at 1250 units/hr Check confirmatory anti-Xa level in 8 hours and daily while on heparin Continue to monitor H&H and platelets  Arabella Merles, PharmD. Clinical Pharmacist 12/01/2022 3:51 AM

## 2022-12-01 NOTE — Plan of Care (Signed)

## 2022-12-01 NOTE — Progress Notes (Signed)
ANTICOAGULATION CONSULT NOTE - Follow-up Note  Pharmacy Consult for Heparin Indication: chest pain/ACS  No Known Allergies  Patient Measurements: Height: 5\' 9"  (175.3 cm) Weight: 98.4 kg (216 lb 14.9 oz) IBW/kg (Calculated) : 70.7 Heparin Dosing Weight: 91.8 kg  Vital Signs: Temp: 99.6 F (37.6 C) (10/13 1152) Temp Source: Oral (10/13 1152) BP: 160/89 (10/13 1152) Pulse Rate: 82 (10/13 1152)  Labs: Recent Labs    11/29/22 1605 11/30/22 0433 11/30/22 0844 11/30/22 1040 11/30/22 1700 12/01/22 0321 12/01/22 0403 12/01/22 1139  HGB 14.2 12.4*  --   --   --  12.3*  --   --   HCT 42.1 37.7*  --   --   --  36.8*  --   --   PLT 154 145*  --   --   --  150  --   --   HEPARINUNFRC  --   --   --   --    < > 0.40 0.34 0.34  CREATININE 1.29* 1.49*  --   --   --  1.47*  --   --   TROPONINIHS  --  2,083* 2,564* 2,041*  --   --   --   --    < > = values in this interval not displayed.    Estimated Creatinine Clearance: 46.4 mL/min (A) (by C-G formula based on SCr of 1.47 mg/dL (H)).   Medical History: Past Medical History:  Diagnosis Date   Benign hypertensive heart disease without heart failure    Coronary atherosclerosis of native coronary artery    S/P CABG   Dyslipidemia    HTN (hypertension)    Obesity    Sinus bradycardia    a. baseline HR 50s.    Medications:  Medications Prior to Admission  Medication Sig Dispense Refill Last Dose   amLODipine (NORVASC) 5 MG tablet Take 1 tablet (5 mg total) by mouth daily. 180 tablet 3 11/28/2022   aspirin EC 81 MG tablet Take 81 mg by mouth daily. Swallow whole.   11/28/2022   carvedilol (COREG) 6.25 MG tablet Take 1 tablet (6.25 mg total) by mouth 2 (two) times daily. Replacing the 3.125 mg 180 tablet 3 11/28/2022   chlorthalidone (HYGROTON) 25 MG tablet Take 1 tablet (25 mg total) by mouth daily. 90 tablet 3 11/28/2022   ezetimibe (ZETIA) 10 MG tablet Take 1 tablet (10 mg total) by mouth daily. 90 tablet 3 11/28/2022    rosuvastatin (CRESTOR) 20 MG tablet Take 1 tablet (20 mg total) by mouth daily. 90 tablet 3 11/28/2022   Scheduled:   carvedilol  6.25 mg Oral BID   ezetimibe  10 mg Oral Daily   furosemide  40 mg Intravenous Once   rosuvastatin  40 mg Oral Daily   sodium chloride flush  3 mL Intravenous Q12H   Infusions:   cefTRIAXone (ROCEPHIN)  IV Stopped (11/30/22 1812)   heparin 1,250 Units/hr (12/01/22 1610)   PRN: acetaminophen, albuterol  Assessment: Jeffery Barr with a history of CAD w/ hx of CABG, AF, TIA, HLD. Heparin per pharmacy consult placed for chest pain/ACS.  Heparin confirmatory level therapeutic at 0.34 on 1250 units/hr. No issues with infusion or bleeding per RN. Hgb 12.3; plt 150  Goal of Therapy:  Heparin level 0.3-0.7 units/ml Monitor platelets by anticoagulation protocol: Yes   Plan:  Continue heparin infusion at 1250 units/hr Daily Heparin level and CBC Continue to monitor H&H and platelets  Verdene Rio, PharmD PGY1 Pharmacy Resident

## 2022-12-01 NOTE — Progress Notes (Signed)
PT Cancellation Note  Patient Details Name: JAIVION KINGSLEY MRN: [id number] DOB: 1940-05-06   Cancelled Treatment:    Reason Eval/Treat Not Completed: Other (comment)  Pt politely declining PT, reports feeling tired and wanting a nap;   Will follow up later today as time allows;  Otherwise, will follow up for PT tomorrow;   Thank you,  Van Clines, PT  Acute Rehabilitation Services Office [phone]    Levi Aland 12/01/2022, 3:27 PM

## 2022-12-01 NOTE — Evaluation (Signed)
Occupational Therapy Evaluation Patient Details Name: Jeffery Barr MRN: [id number] DOB: 11-16-1940 Today's Date: 12/01/2022   History of Present Illness Pt is an 82 y.o. male presenting with back pain and LE weakness. Work up for epidural abscess negative. UA concerning for UTI. PMH significant for CAD s/p CBG, HTN, HLD, paroxysmal A-fib.   Clinical Impression   PTA, pt lived with his wife and was independent. Per wife, she has been noticing more difficulty with memory over the past few weeks; of note pt also hard of hearing affecting ability to receive information and infrequently asked for information to be repeated. Upon eval, pt with decreased memory, slowed problem solving, awareness, and orientation. Pt needing supervision for ADL and more significant assist with cognition based IADL. Pt to benefit from OP OT at discharge.       If plan is discharge home, recommend the following: Direct supervision/assist for financial management;Direct supervision/assist for medications management;Assistance with cooking/housework;Assist for transportation    Functional Status Assessment  Patient has had a recent decline in their functional status and demonstrates the ability to make significant improvements in function in a reasonable and predictable amount of time.  Equipment Recommendations  None recommended by OT    Recommendations for Other Services Speech consult (cog)     Precautions / Restrictions Restrictions Weight Bearing Restrictions: No      Mobility Bed Mobility Overal bed mobility: Modified Independent                  Transfers Overall transfer level: Needs assistance Equipment used: None Transfers: Sit to/from Stand Sit to Stand: Supervision           General transfer comment: for safety      Balance Overall balance assessment: Mild deficits observed, not formally tested                                         ADL either performed  or assessed with clinical judgement   ADL Overall ADL's : Needs assistance/impaired Eating/Feeding: Modified independent;Sitting   Grooming: Oral care;Supervision/safety;Standing   Upper Body Bathing: Set up;Sitting   Lower Body Bathing: Set up;Sit to/from stand;Contact guard assist   Upper Body Dressing : Set up;Sitting   Lower Body Dressing: Contact guard assist;Sit to/from stand   Toilet Transfer: Supervision/safety;Ambulation           Functional mobility during ADLs: Supervision/safety       Vision Baseline Vision/History: 1 Wears glasses (reading) Ability to See in Adequate Light: 0 Adequate Patient Visual Report: No change from baseline Vision Assessment?: Vision impaired- to be further tested in functional context Additional Comments: pt needing min cues to locate items at sink but when asked about glasses, reports he only wears them to read     Perception Perception: Not tested       Praxis Praxis: Not tested       Pertinent Vitals/Pain Pain Assessment Pain Assessment: No/denies pain     Extremity/Trunk Assessment Upper Extremity Assessment Upper Extremity Assessment: Overall WFL for tasks assessed   Lower Extremity Assessment Lower Extremity Assessment: Defer to PT evaluation   Cervical / Trunk Assessment Cervical / Trunk Assessment: Normal   Communication Communication Communication: Hearing impairment (hard of hearing)   Cognition Arousal: Alert Behavior During Therapy: WFL for tasks assessed/performed Overall Cognitive Status: Impaired/Different from baseline Area of Impairment: Orientation, Attention, Following commands, Problem solving, Memory,  Awareness                 Orientation Level: Disoriented to, Time Current Attention Level: Sustained (in presence of minimum distraction) Memory: Decreased short-term memory Following Commands: Follows one step commands consistently, Follows one step commands with increased time, Follows  multi-step commands inconsistently   Awareness: Intellectual Problem Solving: Slow processing, Requires verbal cues       General Comments  VSS. Wife present who confirms history and assists as needed. She reports pt has had memory decline for several weeks.    Exercises     Shoulder Instructions      Home Living Family/patient expects to be discharged to:: Private residence Living Arrangements: Spouse/significant other Available Help at Discharge: Family;Available 24 hours/day Type of Home: House Home Access: Stairs to enter [employer] of Steps: threshold   Home Layout: One level     Bathroom Shower/Tub: Producer, television/film/video: Handicapped height     Home Equipment: Shower seat - built in          Prior Functioning/Environment Prior Level of Function : Independent/Modified Independent;Driving             Mobility Comments: no AD ADLs Comments: independent in ADL and IADL        OT Problem List: Decreased cognition;Decreased activity tolerance      OT Treatment/Interventions: Self-care/ADL training;Therapeutic exercise;DME and/or AE instruction;Balance training;Patient/family education;Therapeutic activities    OT Goals(Current goals can be found in the care plan section) Acute Rehab OT Goals Patient Stated Goal: go home OT Goal Formulation: With patient Time For Goal Achievement: 12/15/22 Potential to Achieve Goals: Good  OT Frequency: Min 1X/week    Co-evaluation              AM-PAC OT "6 Clicks" Daily Activity     Outcome Measure Help from another person eating meals?: None Help from another person taking care of personal grooming?: A Little Help from another person toileting, which includes using toliet, bedpan, or urinal?: A Little Help from another person bathing (including washing, rinsing, drying)?: A Little Help from another person to put on and taking off regular upper body clothing?: A Little Help from another  person to put on and taking off regular lower body clothing?: A Little 6 Click Score: 19   End of Session Equipment Utilized During Treatment: Gait belt Nurse Communication: Mobility status  Activity Tolerance: Patient tolerated treatment well Patient left: in chair;with call bell/phone within reach;with family/visitor present;with chair alarm set  OT Visit Diagnosis: Unsteadiness on feet (R26.81);Other symptoms and signs involving cognitive function                Time: 1610-9604 OT Time Calculation (min): 35 min Charges:  OT General Charges $OT Visit: 1 Visit OT Evaluation $OT Eval Low Complexity: 1 Low OT Treatments $Self Care/Home Management : 8-22 mins  Tyler Deis, OTR/L [facility] Acute Rehabilitation Office: [phone]   Myrla Halsted 12/01/2022, 2:21 PM

## 2022-12-01 NOTE — Progress Notes (Signed)
Patient very confused. Removing oxygen, telemetry, and gown. Reorientation provided.

## 2022-12-02 ENCOUNTER — Inpatient Hospital Stay (HOSPITAL_COMMUNITY): Payer: Medicare Other

## 2022-12-02 ENCOUNTER — Other Ambulatory Visit (HOSPITAL_COMMUNITY): Payer: Self-pay

## 2022-12-02 DIAGNOSIS — N10 Acute tubulo-interstitial nephritis: Secondary | ICD-10-CM | POA: Diagnosis not present

## 2022-12-02 DIAGNOSIS — I10 Essential (primary) hypertension: Secondary | ICD-10-CM | POA: Diagnosis not present

## 2022-12-02 DIAGNOSIS — E7800 Pure hypercholesterolemia, unspecified: Secondary | ICD-10-CM

## 2022-12-02 DIAGNOSIS — I2583 Coronary atherosclerosis due to lipid rich plaque: Secondary | ICD-10-CM

## 2022-12-02 DIAGNOSIS — R7989 Other specified abnormal findings of blood chemistry: Secondary | ICD-10-CM

## 2022-12-02 DIAGNOSIS — I2510 Atherosclerotic heart disease of native coronary artery without angina pectoris: Secondary | ICD-10-CM | POA: Diagnosis not present

## 2022-12-02 DIAGNOSIS — I5031 Acute diastolic (congestive) heart failure: Secondary | ICD-10-CM

## 2022-12-02 LAB — BASIC METABOLIC PANEL
Anion gap: 9 (ref 5–15)
BUN: 31 mg/dL — ABNORMAL HIGH (ref 8–23)
CO2: 24 mmol/L (ref 22–32)
Calcium: 8.2 mg/dL — ABNORMAL LOW (ref 8.9–10.3)
Chloride: 105 mmol/L (ref 98–111)
Creatinine, Ser: 1.32 mg/dL — ABNORMAL HIGH (ref 0.61–1.24)
GFR, Estimated: 55 mL/min — ABNORMAL LOW (ref >60)
Glucose, Bld: 107 mg/dL — ABNORMAL HIGH (ref 70–99)
Potassium: 3.6 mmol/L (ref 3.5–5.1)
Sodium: 138 mmol/L (ref 135–145)

## 2022-12-02 LAB — MRSA NEXT GEN BY PCR, NASAL: MRSA by PCR Next Gen: NOT DETECTED

## 2022-12-02 LAB — PROCALCITONIN: Procalcitonin: 0.99 ng/mL

## 2022-12-02 LAB — CBC
HCT: 36.4 % — ABNORMAL LOW (ref 39.0–52.0)
Hemoglobin: 12.2 g/dL — ABNORMAL LOW (ref 13.0–17.0)
MCH: 29.4 pg (ref 26.0–34.0)
MCHC: 33.5 g/dL (ref 30.0–36.0)
MCV: 87.7 fL (ref 80.0–100.0)
Platelets: 180 10*3/uL (ref 150–400)
RBC: 4.15 MIL/uL — ABNORMAL LOW (ref 4.22–5.81)
RDW: 13.6 % (ref 11.5–15.5)
WBC: 8.1 10*3/uL (ref 4.0–10.5)
nRBC: 0 % (ref 0.0–0.2)

## 2022-12-02 LAB — HEPARIN LEVEL (UNFRACTIONATED): Heparin Unfractionated: 0.41 [IU]/mL (ref 0.30–0.70)

## 2022-12-02 MED ORDER — APIXABAN 5 MG PO TABS
5.0000 mg | ORAL_TABLET | Freq: Two times a day (BID) | ORAL | Status: DC
  Administered 2022-12-02 – 2022-12-05 (×7): 5 mg via ORAL
  Filled 2022-12-02 (×7): qty 1

## 2022-12-02 MED ORDER — FUROSEMIDE 10 MG/ML IJ SOLN
40.0000 mg | Freq: Once | INTRAMUSCULAR | Status: AC
  Administered 2022-12-02: 40 mg via INTRAVENOUS
  Filled 2022-12-02: qty 4

## 2022-12-02 MED ORDER — FUROSEMIDE 10 MG/ML IJ SOLN
40.0000 mg | Freq: Every day | INTRAMUSCULAR | Status: DC
  Administered 2022-12-02 – 2022-12-03 (×2): 40 mg via INTRAVENOUS
  Filled 2022-12-02 (×2): qty 4

## 2022-12-02 MED ORDER — POTASSIUM CHLORIDE CRYS ER 10 MEQ PO TBCR
10.0000 meq | EXTENDED_RELEASE_TABLET | Freq: Two times a day (BID) | ORAL | Status: DC
  Administered 2022-12-02 – 2022-12-04 (×5): 10 meq via ORAL
  Filled 2022-12-02 (×5): qty 1

## 2022-12-02 NOTE — Discharge Instructions (Signed)

## 2022-12-02 NOTE — Evaluation (Signed)
Physical Therapy Evaluation Patient Details Name: Jeffery Barr MRN: [id number] DOB: 1940/09/02 Today's Date: 12/02/2022  History of Present Illness  Pt is an 82 y.o. male presenting 10/11 with back pain and LE weakness. Severe sepsis secondary to UTI . MRI ruled out any signs of infection lower back. Acute hypoxic respiratory failure secondary to acute on chronic congestive heart failure. Concern for NSTEMI. PMH significant for CAD s/p CBG, HTN, HLD, paroxysmal A-fib.   Clinical Impression    Pt admitted with above diagnosis. Independent PTA. Reports onset of shuffling and one episode of sliding out of his bed before admission to hospital. Feels nearly back to baseline with mobility. VSS throughout with SpO2 96% on RA while ambulating today. Demonstrates mild gait deficits but does not require physical intervention to correct. Slightly more stable with RW for support, pt agreeable to RW use at d/c and states he has access to one. May benefit from OPPT services at d/c. Will follow during admission. Pt currently with functional limitations due to the deficits listed below (see PT Problem List). Pt will benefit from acute skilled PT to increase their independence and safety with mobility to allow discharge.           If plan is discharge home, recommend the following: Assist for transportation;Assistance with cooking/housework   Can travel by private vehicle   yes     Equipment Recommendations None recommended by PT (Pt states he plans to borrow a RW as needed.)     Functional Status Assessment Patient has had a recent decline in their functional status and demonstrates the ability to make significant improvements in function in a reasonable and predictable amount of time.     Precautions / Restrictions Precautions Precautions: Fall Restrictions Weight Bearing Restrictions: No      Mobility  Bed Mobility Overal bed mobility: Modified Independent             General bed  mobility comments: No assist, extra time.    Transfers Overall transfer level: Needs assistance Equipment used: Rolling walker (2 wheels) Transfers: Sit to/from Stand Sit to Stand: Supervision           General transfer comment: Supervision for safety, stable with light support from RW.    Ambulation/Gait Ambulation/Gait assistance: Supervision Gait Distance (Feet): 125 Feet Assistive device: Rolling walker (2 wheels), None Gait Pattern/deviations: Step-through pattern, Decreased stride length, Drifts right/left Gait velocity: decr Gait velocity interpretation: <1.31 ft/sec, indicative of household ambulator   General Gait Details: Minor drift noted, pt aware, intermittently with decreased step length but no overt buckling, stagger, or LOB. Ambulated in hallways and room with supervision for safety, initially with RW for support, then no AD. Pt feels walker is unneccesary but agreeable to use at d/c in the event that symptoms fluctuate.  Stairs            Wheelchair Mobility     Tilt Bed    Modified Rankin (Stroke Patients Only)       Balance Overall balance assessment: Mild deficits observed, not formally tested                                           Pertinent Vitals/Pain Pain Assessment Pain Assessment: Faces Faces Pain Scale: Hurts a little bit Pain Location: back Pain Descriptors / Indicators: Aching Pain Intervention(s): Monitored during session, Repositioned    Home Living  Family/patient expects to be discharged to:: Private residence Living Arrangements: Spouse/significant other Available Help at Discharge: Family;Available 24 hours/day Type of Home: House Home Access: Stairs to enter   [employer] of Steps: threshold   Home Layout: One level Home Equipment: Shower seat - built in      Prior Function Prior Level of Function : Independent/Modified Independent;Driving;History of Falls (last six months)              Mobility Comments: Ind, no AD. Slid out of bed, no other falls reported. ADLs Comments: independent in ADL and IADL     Extremity/Trunk Assessment   Upper Extremity Assessment Upper Extremity Assessment: Defer to OT evaluation    Lower Extremity Assessment Lower Extremity Assessment: RLE deficits/detail (Grossly 5/5 MMT LEs. except Rt hallux ext 4-/5) RLE Deficits / Details: Grossly 5/5 except Rt hallux extension 4-/5    Cervical / Trunk Assessment Cervical / Trunk Assessment: Normal  Communication   Communication Communication: Hearing impairment (hard of hearing)  Cognition Arousal: Alert Behavior During Therapy: WFL for tasks assessed/performed Overall Cognitive Status: No family/caregiver present to determine baseline cognitive functioning                                          General Comments General comments (skin integrity, edema, etc.): SpO2 96-97% on RA throughout session.    Exercises     Assessment/Plan    PT Assessment Patient needs continued PT services  PT Problem List Decreased strength;Decreased activity tolerance;Decreased balance;Decreased mobility;Decreased knowledge of use of DME;Pain       PT Treatment Interventions DME instruction;Gait training;Therapeutic activities;Functional mobility training;Therapeutic exercise;Balance training;Neuromuscular re-education;Patient/family education    PT Goals (Current goals can be found in the Care Plan section)  Acute Rehab PT Goals Patient Stated Goal: Get well PT Goal Formulation: With patient Time For Goal Achievement: 12/16/22 Potential to Achieve Goals: Good    Frequency Min 1X/week     Co-evaluation               AM-PAC PT "6 Clicks" Mobility  Outcome Measure Help needed turning from your back to your side while in a flat bed without using bedrails?: None Help needed moving from lying on your back to sitting on the side of a flat bed without using bedrails?:  None Help needed moving to and from a bed to a chair (including a wheelchair)?: None Help needed standing up from a chair using your arms (e.g., wheelchair or bedside chair)?: None Help needed to walk in hospital room?: A Little Help needed climbing 3-5 steps with a railing? : A Little 6 Click Score: 22    End of Session Equipment Utilized During Treatment: Gait belt Activity Tolerance: Patient tolerated treatment well Patient left: in chair;with call bell/phone within reach;with chair alarm set Nurse Communication: Mobility status (O2 96% on RA.) PT Visit Diagnosis: Unsteadiness on feet (R26.81);Other abnormalities of gait and mobility (R26.89);Pain Pain - part of body:  (back)    Time: 1610-9604 PT Time Calculation (min) (ACUTE ONLY): 21 min   Charges:   PT Evaluation $PT Eval Low Complexity: 1 Low   PT General Charges $$ ACUTE PT VISIT: 1 Visit         Kathlyn Sacramento, PT, DPT [facility]  Rehabilitation Services Physical Therapist Office: [phone] Website: [email or web address]   Berton Mount 12/02/2022, 11:07 AM

## 2022-12-02 NOTE — Progress Notes (Signed)
Heart Failure Navigator Progress Note  Assessed for Heart & Vascular TOC clinic readiness.  Patient does not meet criteria due to Dementia diagnosis.  Navigator will sign off at this time.  Roxy Horseman, RN, BSN [facility] Heart Failure Navigator Secure Chat Only

## 2022-12-02 NOTE — Progress Notes (Signed)
PROGRESS NOTE    [name]  UXL:[id number] DOB: Jan 14, 1941 DOA: 11/29/2022 PCP: [name], MD   Brief Narrative:  HPI:  Jeffery Barr is a 82 y.o. male with hx of CAD with history CABG, A-fib not on anticoagulation, TIA, hypertension, hyperlipidemia, who was transferred ED to ED from [facility] due to initial concern for possible epidural abscess and for MRI.  Patient had presented with few day history of midline lower back pain, and fevers.  Per his wife has also had generalized weakness, possible rigoring, difficulty getting around.  Today slid out of bed but did not fall or hit his head or sustain other injuries.  He has no numbness or weakness in his lower extremities.  There is no bowel or bladder incontinence.  Does acknowledge recent dysuria.     Denies any history of recent or active chest pain (after his episode of arrhythmia /resp failure) in the ED. does acknowledge recent history of dyspnea on exertion.   Assessment & Plan:   Principal Problem:   Urinary tract infection Active Problems:   NSTEMI (non-ST elevated myocardial infarction) (HCC)   Severe sepsis (HCC)   Hypoxic respiratory failure (HCC)   Acute on chronic diastolic heart failure (HCC)   Atrial fibrillation with RVR (HCC)   Acute hypoxic respiratory failure (HCC)  Severe sepsis secondary to UTI, POA: Patient meets criteria for severe sepsis based on fever, tachycardia, tachypnea and endorgan dysfunction such as AKI.  Has been started on vancomycin and cefepime.  No other source of infection other than UTI and thus antibiotics were narrowed to Rocephin.  All the cultures are negative so far and finally patient has remained afebrile for last 24 hours, with last temperature spike of 101.5 at midnight 12/01/2022 but no leukocytosis.  Patient clinically improving.  Will obtain CT abdomen pelvis to rule out pyelo if continues to have fever.  Acute hypoxic respiratory failure secondary to acute on  chronic congestive heart failure with preserved ejection fraction: Patient required BiPAP for a brief period of time in the ED.  Currently weaned down to 2 L of oxygen.  Chest x-ray with pulmonary edema with elevated BNP.  Echo with normal ejection fraction and no wall motion abnormality.  Patient received couple of doses of Lasix in the ED. I started him on Lasix 40 mg IV daily on 12/01/2022, has not had enough diuresis as expected.  He received a dose this morning.  I am going to give him another extra dose of 40 mg IV later today.  Currently on room air.  Cardiology on board.  NSTEMI ruled out: Significant elevated troponin around 2000s, per cardiology, this is demand ischemia and does not need any intervention since patient is asymptomatic.  Paroxysmal atrial fibrillation: Previous history of paroxysmal atrial fibrillation during episode of COVID, reportedly was on [facility] previously but could not afford.  Appears to have atrial fibrillation now but with controlled ventricular response.  Currently on heparin, transitioned to Eliquis.  CHA2DS2-VASc score 3.   Midline back pain: Initial concern was possible epidural abscess.  However MRI ruled out any signs of infection.  Continue current pain management.  PT OT.  History of hypertension: Blood pressure controlled, continue amlodipine and Coreg.  Dyslipidemia.  Low LDL.  Continue atorvastatin.  Obesity: BMI 32.04.  Will need counseling for weight loss and dietary modification at the time of discharge.  AKI ruled out, CKD stage IIIa ruled in: Creatinine at baseline is 1.3.  He is  at baseline now.  DVT prophylaxis: Heparin   Code Status: Full Code  Family Communication: None present at bedside.  Plan of care discussed with patient in length and he/she verbalized understanding and agreed with it.  Status is: Inpatient Remains inpatient appropriate because: Patient with severe sepsis, needs IV diuresis.   Estimated body mass index is 32.04 kg/m as  calculated from the following:   Height as of this encounter: 5\' 9"  (1.753 m).   Weight as of this encounter: 98.4 kg.    Nutritional Assessment: Body mass index is 32.04 kg/m.Marland Kitchen Seen by dietician.  I agree with the assessment and plan as outlined below: Nutrition Status:        . Skin Assessment: I have examined the patient's skin and I agree with the wound assessment as performed by the wound care RN as outlined below:    Consultants:  Cardiology  Procedures:  As above  Antimicrobials:  Anti-infectives (From admission, onward)    Start     Dose/Rate Route Frequency Ordered Stop   11/30/22 1700  vancomycin (VANCOREADY) IVPB 1250 mg/250 mL  Status:  Discontinued        1,250 mg 166.7 mL/hr over 90 Minutes Intravenous Every 24 hours 11/30/22 0721 11/30/22 0852   11/30/22 1600  cefTRIAXone (ROCEPHIN) 1 g in sodium chloride 0.9 % 100 mL IVPB        1 g 200 mL/hr over 30 Minutes Intravenous Every 24 hours 11/30/22 0852 12/05/22 1559   11/30/22 0415  ceFEPIme (MAXIPIME) 2 g in sodium chloride 0.9 % 100 mL IVPB  Status:  Discontinued        2 g 200 mL/hr over 30 Minutes Intravenous Every 12 hours 11/30/22 0358 11/30/22 0852   11/29/22 1630  vancomycin (VANCOCIN) IVPB 1000 mg/200 mL premix       Placed in "And" Linked Group   1,000 mg 200 mL/hr over 60 Minutes Intravenous  Once 11/29/22 1616 11/30/22 0327   11/29/22 1615  ceFEPIme (MAXIPIME) 2 g in sodium chloride 0.9 % 100 mL IVPB        2 g 200 mL/hr over 30 Minutes Intravenous  Once 11/29/22 1612 11/29/22 1748   11/29/22 1615  metroNIDAZOLE (FLAGYL) IVPB 500 mg        500 mg 100 mL/hr over 60 Minutes Intravenous  Once 11/29/22 1612 11/29/22 1857   11/29/22 1615  vancomycin (VANCOCIN) IVPB 1000 mg/200 mL premix  Status:  Discontinued        1,000 mg 200 mL/hr over 60 Minutes Intravenous  Once 11/29/22 1612 11/29/22 1613   11/29/22 1615  vancomycin (VANCOCIN) IVPB 1000 mg/200 mL premix       Placed in "And" Linked Group    1,000 mg 200 mL/hr over 60 Minutes Intravenous  Once 11/29/22 1616 11/29/22 1754         Subjective: Seen and examined.  Feels well.  No complaints.  Frustrated for being in the hospital.  Objective: Vitals:   12/02/22 0434 12/02/22 0856 12/02/22 1028 12/02/22 1120  BP: (!) 157/72 (!) 143/72  123/68  Pulse: 85 88  61  Resp: 20 (!) 22  20  Temp: 98.8 F (37.1 C) 98.7 F (37.1 C)  97.9 F (36.6 C)  TempSrc: Oral Oral  Oral  SpO2: 94% 97% 94% 93%  Weight:      Height:        Intake/Output Summary (Last 24 hours) at 12/02/2022 1418 Last data filed at 12/02/2022 0959 Gross per 24  hour  Intake 366 ml  Output 700 ml  Net -334 ml   Filed Weights   11/29/22 1547 12/01/22 0513  Weight: 99.8 kg 98.4 kg    Examination:  General exam: Appears calm and comfortable  Respiratory system: Bibasilar crackles. Respiratory effort normal. Cardiovascular system: S1 & S2 heard, RRR. No JVD, murmurs, rubs, gallops or clicks. No pedal edema. Gastrointestinal system: Abdomen is nondistended, soft and nontender. No organomegaly or masses felt. Normal bowel sounds heard. Central nervous system: Alert and oriented. No focal neurological deficits. Extremities: Symmetric 5 x 5 power. Skin: No rashes, lesions or ulcers.  Psychiatry: Judgement and insight appear normal. Mood & affect appropriate.   Data Reviewed: I have personally reviewed following labs and imaging studies  CBC: Recent Labs  Lab 11/29/22 1605 11/30/22 0433 12/01/22 0321 12/02/22 0339  WBC 9.8 10.1 9.3 8.1  HGB 14.2 12.4* 12.3* 12.2*  HCT 42.1 37.7* 36.8* 36.4*  MCV 85.9 88.5 84.8 87.7  PLT 154 145* 150 180   Basic Metabolic Panel: Recent Labs  Lab 11/29/22 1605 11/30/22 0433 12/01/22 0321 12/02/22 0339  NA 135 136 135 138  K 3.7 3.7 3.5 3.6  CL 98 105 103 105  CO2 21* 20* 21* 24  GLUCOSE 134* 146* 130* 107*  BUN 19 18 31* 31*  CREATININE 1.29* 1.49* 1.47* 1.32*  CALCIUM 8.9 8.2* 8.0* 8.2*  MG  --  1.9   --   --   PHOS  --  2.7  --   --    GFR: Estimated Creatinine Clearance: 51.6 mL/min (A) (by C-G formula based on SCr of 1.32 mg/dL (H)). Liver Function Tests: Recent Labs  Lab 11/29/22 1605  AST 24  ALT 16  ALKPHOS 53  BILITOT 1.1  PROT 7.3  ALBUMIN 3.5   No results for input(s): "LIPASE", "AMYLASE" in the last 168 hours. No results for input(s): "AMMONIA" in the last 168 hours. Coagulation Profile: No results for input(s): "INR", "PROTIME" in the last 168 hours. Cardiac Enzymes: No results for input(s): "CKTOTAL", "CKMB", "CKMBINDEX", "TROPONINI" in the last 168 hours. BNP (last 3 results) No results for input(s): "PROBNP" in the last 8760 hours. HbA1C: Recent Labs    11/30/22 0844  HGBA1C 5.6   CBG: Recent Labs  Lab 11/29/22 1556  GLUCAP 143*   Lipid Profile: Recent Labs    11/30/22 0844  CHOL 135  HDL 38*  LDLCALC 84  TRIG 65  CHOLHDL 3.6   Thyroid Function Tests: No results for input(s): "TSH", "T4TOTAL", "FREET4", "T3FREE", "THYROIDAB" in the last 72 hours. Anemia Panel: No results for input(s): "VITAMINB12", "FOLATE", "FERRITIN", "TIBC", "IRON", "RETICCTPCT" in the last 72 hours. Sepsis Labs: Recent Labs  Lab 11/29/22 1605 11/29/22 1805 12/02/22 0335  PROCALCITON  --   --  0.99  LATICACIDVEN 2.0* 1.0  --     Recent Results (from the past 240 hour(s))  Blood Culture (routine x 2)     Status: None (Preliminary result)   Collection Time: 11/29/22  4:05 PM   Specimen: BLOOD  Result Value Ref Range Status   Specimen Description   Final    BLOOD RIGHT ANTECUBITAL Performed at [facility], [address] [place], [place] [address]    Special Requests   Final    BOTTLES DRAWN AEROBIC AND ANAEROBIC Blood Culture adequate volume Performed at [facility], [address] [place], [place] [address]    Culture   Final    NO  GROWTH 3 DAYS Performed at [facility], [address], [place], [place] [address]     Report Status PENDING  Incomplete  Urine Culture     Status: None   Collection Time: 11/29/22  4:05 PM   Specimen: Urine, Random  Result Value Ref Range Status   Specimen Description   Final    URINE, RANDOM Performed at [facility], [address] [place], [place] [address]    Special Requests   Final    NONE Reflexed from [id number] Performed at [facility], [address] [place], [place] [address]    Culture   Final    NO GROWTH Performed at [facility], [address] [place], [place] [address]    Report Status 12/01/2022 FINAL  Final  Resp panel by RT-PCR (RSV, Flu A&B, Covid)     Status: None   Collection Time: 11/29/22  4:26 PM   Specimen: Nasal Swab  Result Value Ref Range Status   SARS Coronavirus 2 by RT PCR NEGATIVE NEGATIVE Final    Comment: (NOTE) SARS-CoV-2 target nucleic acids are NOT DETECTED.  The SARS-CoV-2 RNA is generally detectable in upper respiratory specimens during the acute phase of infection. The lowest concentration of SARS-CoV-2 viral copies this assay can detect is 138 copies/mL. A negative result does not preclude SARS-Cov-2 infection and should not be used as the sole basis for treatment or other patient management decisions. A negative result may occur with  improper specimen collection/handling, submission of specimen other than nasopharyngeal swab, presence of viral mutation(s) within the areas targeted by this assay, and inadequate number of viral copies(<138 copies/mL). A negative result must be combined with clinical observations, patient history, and epidemiological information. The expected result is Negative.  Fact Sheet for Patients:  BloggerCourse.com  Fact Sheet for Healthcare Providers:  SeriousBroker.it  This test is no t yet approved or cleared by the [place] FDA and  has been authorized for detection and/or diagnosis of SARS-CoV-2 by FDA  under an Emergency Use Authorization (EUA). This EUA will remain  in effect (meaning this test can be used) for the duration of the COVID-19 declaration under Section 564(b)(1) of the Act, 21 U.S.C.section 360bbb-3(b)(1), unless the authorization is terminated  or revoked sooner.       Influenza A by PCR NEGATIVE NEGATIVE Final   Influenza B by PCR NEGATIVE NEGATIVE Final    Comment: (NOTE) The Xpert Xpress SARS-CoV-2/FLU/RSV plus assay is intended as an aid in the diagnosis of influenza from Nasopharyngeal swab specimens and should not be used as a sole basis for treatment. Nasal washings and aspirates are unacceptable for Xpert Xpress SARS-CoV-2/FLU/RSV testing.  Fact Sheet for Patients: BloggerCourse.com  Fact Sheet for Healthcare Providers: SeriousBroker.it  This test is not yet approved or cleared by the [place] FDA and has been authorized for detection and/or diagnosis of SARS-CoV-2 by FDA under an Emergency Use Authorization (EUA). This EUA will remain in effect (meaning this test can be used) for the duration of the COVID-19 declaration under Section 564(b)(1) of the Act, 21 U.S.C. section 360bbb-3(b)(1), unless the authorization is terminated or revoked.     Resp Syncytial Virus by PCR NEGATIVE NEGATIVE Final    Comment: (NOTE) Fact Sheet for Patients: BloggerCourse.com  Fact Sheet for Healthcare Providers: SeriousBroker.it  This test is not yet approved or cleared by the [place] FDA and has been authorized for detection and/or diagnosis of SARS-CoV-2 by  FDA under an Emergency Use Authorization (EUA). This EUA will remain in effect (meaning this test can be used) for the duration of the COVID-19 declaration under Section 564(b)(1) of the Act, 21 U.S.C. section 360bbb-3(b)(1), unless the authorization is terminated or revoked.  Performed at [facility], [address] [place], [place] [address]   Blood Culture (routine x 2)     Status: None (Preliminary result)   Collection Time: 11/29/22  4:26 PM   Specimen: BLOOD  Result Value Ref Range Status   Specimen Description   Final    BLOOD LEFT ANTECUBITAL Performed at [facility], [address] [place], [place] [address]    Special Requests   Final    BOTTLES DRAWN AEROBIC AND ANAEROBIC Blood Culture results may not be optimal due to an inadequate volume of blood received in culture bottles Performed at [facility], [address] [place], [place] [address]    Culture   Final    NO GROWTH 3 DAYS Performed at [facility], [address], [place], [place] [address]    Report Status PENDING  Incomplete  SARS Coronavirus 2 by RT PCR (hospital order, performed in [facility] hospital lab) *cepheid single result test* Anterior Nasal Swab     Status: None   Collection Time: 11/30/22 12:03 AM   Specimen: Anterior Nasal Swab  Result Value Ref Range Status   SARS Coronavirus 2 by RT PCR NEGATIVE NEGATIVE Final    Comment: Performed at [facility], [address], [place], [place] [address]     Radiology Studies: No results found.  Scheduled Meds:  amLODipine  5 mg Oral Daily   apixaban  5 mg Oral BID   aspirin EC  81 mg Oral Daily   carvedilol  6.25 mg Oral BID   ezetimibe  10 mg Oral Daily   furosemide  40 mg Intravenous Daily   furosemide  40 mg Intravenous Once   potassium chloride  10 mEq Oral BID   rosuvastatin  40 mg Oral Daily   sodium chloride flush  3 mL Intravenous Q12H   Continuous Infusions:  cefTRIAXone (ROCEPHIN)  IV 1 g (12/01/22 1716)     LOS: 2 days   Total time spent: 38 minutes  Hughie Closs, MD [facility]  12/02/2022, 2:18 PM   *Please note that this is a verbal dictation therefore any spelling or grammatical errors are due to the "Dragon Medical One" system interpretation.  Please page  via Amion and do not message via secure chat for urgent patient care matters. Secure chat can be used for non urgent patient care matters.  How to contact the [facility] Attending or Consulting provider 7A - 7P or covering provider during after hours 7P -7A, for this patient?  Check the care team in [facility] and look for a) attending/consulting TR[facility] provider listed and b) the [facility] team listed. Page or secure chat 7A-7P. Log into [email or web address] and use [facility]'s universal password to access. If you do not have the password, please contact the hospital operator. Locate the [facility] provider you are looking for under [facility] and page to a number that you can be directly reached. If you still have difficulty reaching the provider, please page the [facility] (Director on Call) for the Hospitalists listed on amion for assistance.

## 2022-12-02 NOTE — Progress Notes (Addendum)
Rounding Note    Patient Name: Jeffery Barr Date of Encounter: 12/02/2022  [facility] Cardiologist: Armanda Magic, MD    Subjective    Admitted with urosepsis and concern for epidural abscess which has been ruled out Noted to be in afib with CVR  Troponins are + with flat trend [phone])  He denies any chest pain ( although he appears to have at lease mild - moderate amount of dementia )   2D echo with normal LVF   Inpatient Medications    Scheduled Meds:  amLODipine  5 mg Oral Daily   aspirin EC  81 mg Oral Daily   carvedilol  6.25 mg Oral BID   ezetimibe  10 mg Oral Daily   furosemide  40 mg Intravenous Daily   furosemide  40 mg Intravenous Once   potassium chloride  10 mEq Oral BID   rosuvastatin  40 mg Oral Daily   sodium chloride flush  3 mL Intravenous Q12H   Continuous Infusions:  cefTRIAXone (ROCEPHIN)  IV 1 g (12/01/22 1716)   heparin 1,250 Units/hr (12/01/22 2113)   PRN Meds: acetaminophen, albuterol   Vital Signs    Vitals:   12/02/22 0057 12/02/22 0434 12/02/22 0856 12/02/22 1028  BP: 136/70 (!) 157/72 (!) 143/72   Pulse: 71 85 88   Resp: 20 20 (!) 22   Temp: 99 F (37.2 C) 98.8 F (37.1 C) 98.7 F (37.1 C)   TempSrc: Oral Oral Oral   SpO2: 94% 94% 97% 94%  Weight:      Height:        Intake/Output Summary (Last 24 hours) at 12/02/2022 1110 Last data filed at 12/02/2022 0959 Gross per 24 hour  Intake 366 ml  Output 700 ml  Net -334 ml      12/01/2022    5:13 AM 11/29/2022    3:47 PM 11/26/2022   10:09 PM  Last 3 Weights  Weight (lbs) 216 lb 14.9 oz 220 lb 220 lb  Weight (kg) 98.4 kg 99.791 kg 99.791 kg      Telemetry   Atrial fibrillation with CVR  ECG     No new EKG to review- Personally Reviewed  Physical Exam   Physical Exam: Blood pressure (!) 143/72, pulse 88, temperature 98.7 F (37.1 C), temperature source Oral, resp. rate (!) 22, height 5\' 9"  (1.753 m), weight 98.4 kg, SpO2 94%.  GEN:  Well nourished, well developed in no acute distress HEENT: Normal NECK: No JVD; No carotid bruits LYMPHATICS: No lymphadenopathy CARDIAC:irregularly irregular, no murmurs, rubs, gallops RESPIRATORY:  crackles at bases bilaterally ABDOMEN: Soft, non-tender, non-distended MUSCULOSKELETAL:  No edema; No deformity  SKIN: Warm and dry NEUROLOGIC:  Alert and oriented x 3 PSYCHIATRIC:  Normal affect  Labs    High Sensitivity Troponin:   Recent Labs  Lab 11/30/22 0433 11/30/22 0844 11/30/22 1040  TROPONINIHS 2,083* 2,564* 2,041*     Chemistry Recent Labs  Lab 11/29/22 1605 11/30/22 0433 12/01/22 0321 12/02/22 0339  NA 135 136 135 138  K 3.7 3.7 3.5 3.6  CL 98 105 103 105  CO2 21* 20* 21* 24  GLUCOSE 134* 146* 130* 107*  BUN 19 18 31* 31*  CREATININE 1.29* 1.49* 1.47* 1.32*  CALCIUM 8.9 8.2* 8.0* 8.2*  MG  --  1.9  --   --   PROT 7.3  --   --   --   ALBUMIN 3.5  --   --   --  AST 24  --   --   --   ALT 16  --   --   --   ALKPHOS 53  --   --   --   BILITOT 1.1  --   --   --   GFRNONAA 56* 47* 48* 55*  ANIONGAP 16* 11 11 9     Lipids  Recent Labs  Lab 11/30/22 0844  CHOL 135  TRIG 65  HDL 38*  LDLCALC 84  CHOLHDL 3.6    Hematology Recent Labs  Lab 11/30/22 0433 12/01/22 0321 12/02/22 0339  WBC 10.1 9.3 8.1  RBC 4.26 4.34 4.15*  HGB 12.4* 12.3* 12.2*  HCT 37.7* 36.8* 36.4*  MCV 88.5 84.8 87.7  MCH 29.1 28.3 29.4  MCHC 32.9 33.4 33.5  RDW 13.8 13.6 13.6  PLT 145* 150 180   Thyroid No results for input(s): "TSH", "FREET4" in the last 168 hours.  BNP Recent Labs  Lab 11/29/22 1612  BNP 699.5*    DDimer No results for input(s): "DDIMER" in the last 168 hours.   Radiology    ECHOCARDIOGRAM COMPLETE  Result Date: 11/30/2022    ECHOCARDIOGRAM REPORT   Patient Name:   Tiran T Lahti Date of Exam: 11/30/2022 Medical Rec #:  [id number]        Height:       69.0 in Accession #:    [id number]       Weight:       220.0 lb Date of Birth:  04-03-1940          BSA:          2.151 m Patient Age:    82 years         BP:           163/75 mmHg Patient Gender: M                HR:           84 bpm. Exam Location:  Inpatient Procedure: 2D Echo, Color Doppler, Cardiac Doppler and Intracardiac            Opacification Agent Indications:    I50.9* Heart failure (unspecified)  History:        Patient has prior history of Echocardiogram examinations, most                 recent 06/01/2021. CAD, Arrythmias:Atrial Fibrillation; Risk                 Factors:Hypertension and Dyslipidemia.  Sonographer:    Irving Burton Senior RDCS Referring Phys: [id number] Dolly Rias  Sonographer Comments: Technically difficult due to poor echo windows. IMPRESSIONS  1. Left ventricular ejection fraction, by estimation, is 55 to 60%. The left ventricle has normal function. Left ventricular endocardial border not optimally defined to evaluate regional wall motion. There is mild concentric left ventricular hypertrophy. Left ventricular diastolic function could not be evaluated.  2. Right ventricular systolic function is mildly reduced. The right ventricular size is not well visualized. Tricuspid regurgitation signal is inadequate for assessing PA pressure.  3. The mitral valve is normal in structure. Trivial mitral valve regurgitation.  4. The aortic valve has an indeterminant number of cusps. Aortic valve regurgitation is not visualized.  5. The inferior vena cava is normal in size with <50% respiratory variability, suggesting right atrial pressure of 8 mmHg. FINDINGS  Left Ventricle: Left ventricular ejection fraction, by estimation, is 55 to 60%. The left ventricle has normal function. Left ventricular  endocardial border not optimally defined to evaluate regional wall motion. Definity contrast agent was given IV to delineate the left ventricular endocardial borders. The left ventricular internal cavity size was normal in size. There is mild concentric left ventricular hypertrophy. Left ventricular diastolic  function could not be evaluated due to atrial fibrillation. Left ventricular diastolic function could not be evaluated. Right Ventricle: The right ventricular size is not well visualized. Right vetricular wall thickness was not well visualized. Right ventricular systolic function is mildly reduced. Tricuspid regurgitation signal is inadequate for assessing PA pressure. Left Atrium: Left atrial size was normal in size. Right Atrium: Right atrial size was normal in size. Pericardium: There is no evidence of pericardial effusion. Presence of epicardial fat layer. Mitral Valve: The mitral valve is normal in structure. Trivial mitral valve regurgitation. Tricuspid Valve: The tricuspid valve is normal in structure. Tricuspid valve regurgitation is trivial. Aortic Valve: The aortic valve has an indeterminant number of cusps. Aortic valve regurgitation is not visualized. Pulmonic Valve: The pulmonic valve was normal in structure. Pulmonic valve regurgitation is mild. Aorta: The aortic root and ascending aorta are structurally normal, with no evidence of dilitation. Venous: The inferior vena cava is normal in size with less than 50% respiratory variability, suggesting right atrial pressure of 8 mmHg. IAS/Shunts: No atrial level shunt detected by color flow Doppler.  LEFT VENTRICLE PLAX 2D LVIDd:         5.10 cm LVIDs:         3.70 cm LV PW:         1.10 cm LV IVS:        1.10 cm LVOT diam:     1.90 cm LV SV:         45 LV SV Index:   21 LVOT Area:     2.84 cm  RIGHT VENTRICLE RV S prime:     9.46 cm/s TAPSE (M-mode): 1.4 cm LEFT ATRIUM           Index        RIGHT ATRIUM           Index LA diam:      4.40 cm 2.05 cm/m   RA Area:     12.80 cm LA Vol (A4C): 39.6 ml 18.41 ml/m  RA Volume:   24.70 ml  11.48 ml/m  AORTIC VALVE LVOT Vmax:   86.80 cm/s LVOT Vmean:  60.500 cm/s LVOT VTI:    0.157 m  AORTA Ao Root diam: 3.30 cm Ao Asc diam:  3.00 cm  SHUNTS Systemic VTI:  0.16 m Systemic Diam: 1.90 cm Carolan Clines Electronically  signed by Carolan Clines Signature Date/Time: 11/30/2022/1:48:04 PM    Final     Cardiac Studies    Patient Profile     82 y.o. male   with a hx of coronary disease status post CABG in 2002, hypertension, dyslipidemia, obesity and paroxysmal atrial fibrillation with former tobacco abuse who is being seen today for the evaluation of elevated troponin in setting of urosepsis as well as afib with RVR at the request of ED.   Assessment & Plan    #UTI/Urosepsis -pt presents with UTI , profound weakness , no CP  -per TRH  #Elevated Troponin -Troponins were incidentally found to be elevated [phone] -likely related to demand ischemia and not ACS  #CAD  -s/p remote CABG 2002 -denies any CP -Elevated Troponin incidentally found to be elevated [phone] but not c/w ACS and likely related to demand ischemia from  urosepsis -denies any CP -echo this admit with normal LVF -At this point, in the absence of CP and normal LVF with no focal wall motion abnormalities, would pursue conservative therapy for now and plan on outpt Stress PET CT to rule out ischemia -continue ASA 81mg  daily, Carvedilol 6.25mg  BID, Crestor 40mg  daily and Zetia 10mg  daily -LDL 84 this admit>>Crestor increased to 40mg  daily -will need FLP and ALT in 6 weeks  #Acute hypoxemic respiratory failure #Acute Diastolic CHF -required BIPAP transiently -2D echo with normal LVF -BNP elevated -Given lasix IV 40mg  IV yesterday but I&O's are incomplete -weight down 4lbs from admit -SCr stable at 1.32 -No on Lasix 40mg  IV BID -follow strict I&O's, daily weights and renal function while diuresing  #PAF/MAT -has hx of PAF in the past in setting of COVID 19 and also has hx of TIA -apparently had been on AC in the past but could not afford it -Currently on IV Heparin -HR controlled -continue Carvedilol 6.25mg  BID -Pharmacy working application for financial help getting Eliquis  -will transition to Eliquis 5mg  BID and  will give patient 30 day free sample card at discharge  #HTN -BP controlled at 123/66mmHg -continue Carvedilol 6.25mg  BID and Amlodipine 5mg  daily   Total time spent with patient today 40 minutes. This includes reviewing records, discussion with PharmD about financial restrains for anticoagulation, review of 2D echo, evaluating the patient and coordinating care. Face-to-face time >50%.   For questions or updates, please contact [facility] Please consult [email or web address] for contact info under        Signed, Armanda Magic, MD  12/02/2022, 11:10 AM

## 2022-12-02 NOTE — Plan of Care (Signed)
  Problem: Pain Managment: Goal: General experience of comfort will improve Outcome: Progressing   Problem: Safety: Goal: Ability to remain free from injury will improve Outcome: Progressing   Problem: Skin Integrity: Goal: Risk for impaired skin integrity will decrease Outcome: Progressing   

## 2022-12-02 NOTE — Progress Notes (Signed)
ANTICOAGULATION CONSULT NOTE - Follow-up Note  Pharmacy Consult for Heparin Indication: chest pain/ACS  No Known Allergies  Patient Measurements: Height: 5\' 9"  (175.3 cm) Weight: 98.4 kg (216 lb 14.9 oz) IBW/kg (Calculated) : 70.7 Heparin Dosing Weight: 91.8 kg  Vital Signs: Temp: 97.9 F (36.6 C) (10/14 1120) Temp Source: Oral (10/14 1120) BP: 123/68 (10/14 1120) Pulse Rate: 61 (10/14 1120)  Labs: Recent Labs    11/30/22 0433 11/30/22 0844 11/30/22 1040 11/30/22 1700 12/01/22 0321 12/01/22 0403 12/01/22 1139 12/02/22 0339  HGB 12.4*  --   --   --  12.3*  --   --  12.2*  HCT 37.7*  --   --   --  36.8*  --   --  36.4*  PLT 145*  --   --   --  150  --   --  180  HEPARINUNFRC  --   --   --    < > 0.40 0.34 0.34 0.41  CREATININE 1.49*  --   --   --  1.47*  --   --  1.32*  TROPONINIHS 2,083* 2,564* 2,041*  --   --   --   --   --    < > = values in this interval not displayed.    Estimated Creatinine Clearance: 51.6 mL/min (A) (by C-G formula based on SCr of 1.32 mg/dL (H)).   Medical History: Past Medical History:  Diagnosis Date   Benign hypertensive heart disease without heart failure    Coronary atherosclerosis of native coronary artery    S/P CABG   Dyslipidemia    HTN (hypertension)    Obesity    Sinus bradycardia    a. baseline HR 50s.    Medications:  Medications Prior to Admission  Medication Sig Dispense Refill Last Dose   amLODipine (NORVASC) 5 MG tablet Take 1 tablet (5 mg total) by mouth daily. 180 tablet 3 11/28/2022   aspirin EC 81 MG tablet Take 81 mg by mouth daily. Swallow whole.   11/28/2022   carvedilol (COREG) 6.25 MG tablet Take 1 tablet (6.25 mg total) by mouth 2 (two) times daily. Replacing the 3.125 mg 180 tablet 3 11/28/2022   chlorthalidone (HYGROTON) 25 MG tablet Take 1 tablet (25 mg total) by mouth daily. 90 tablet 3 11/28/2022   ezetimibe (ZETIA) 10 MG tablet Take 1 tablet (10 mg total) by mouth daily. 90 tablet 3 11/28/2022    rosuvastatin (CRESTOR) 20 MG tablet Take 1 tablet (20 mg total) by mouth daily. 90 tablet 3 11/28/2022   Scheduled:   amLODipine  5 mg Oral Daily   apixaban  5 mg Oral BID   aspirin EC  81 mg Oral Daily   carvedilol  6.25 mg Oral BID   ezetimibe  10 mg Oral Daily   furosemide  40 mg Intravenous Daily   furosemide  40 mg Intravenous Once   potassium chloride  10 mEq Oral BID   rosuvastatin  40 mg Oral Daily   sodium chloride flush  3 mL Intravenous Q12H   Infusions:   cefTRIAXone (ROCEPHIN)  IV 1 g (12/01/22 1716)   PRN: acetaminophen, albuterol  Assessment: 82 yom with a history of CAD w/ hx of CABG, AF, TIA, HLD. Heparin per pharmacy consult placed for chest pain/ACS.  -heparin level at goal. Plans are to change to apixaban -SCr= 1.3, wt ~ 98kg  Goal of Therapy:  Heparin level 0.3-0.7 units/ml Monitor platelets by anticoagulation protocol: Yes   Plan:  -  Apixaban 5mg  po bid -He has no prescription insurance and an assistance application is in process  Harland German, PharmD Clinical Pharmacist **Pharmacist phone directory can now be found on [email or web address] (PW [email or web address]).  Listed under [facility].

## 2022-12-03 ENCOUNTER — Inpatient Hospital Stay (HOSPITAL_COMMUNITY): Payer: Medicare Other

## 2022-12-03 DIAGNOSIS — N10 Acute tubulo-interstitial nephritis: Secondary | ICD-10-CM | POA: Diagnosis not present

## 2022-12-03 DIAGNOSIS — I2510 Atherosclerotic heart disease of native coronary artery without angina pectoris: Secondary | ICD-10-CM | POA: Diagnosis not present

## 2022-12-03 DIAGNOSIS — R7989 Other specified abnormal findings of blood chemistry: Secondary | ICD-10-CM | POA: Diagnosis not present

## 2022-12-03 DIAGNOSIS — I10 Essential (primary) hypertension: Secondary | ICD-10-CM | POA: Diagnosis not present

## 2022-12-03 DIAGNOSIS — E7800 Pure hypercholesterolemia, unspecified: Secondary | ICD-10-CM | POA: Diagnosis not present

## 2022-12-03 LAB — BASIC METABOLIC PANEL
Anion gap: 10 (ref 5–15)
BUN: 33 mg/dL — ABNORMAL HIGH (ref 8–23)
CO2: 26 mmol/L (ref 22–32)
Calcium: 8.6 mg/dL — ABNORMAL LOW (ref 8.9–10.3)
Chloride: 104 mmol/L (ref 98–111)
Creatinine, Ser: 1.3 mg/dL — ABNORMAL HIGH (ref 0.61–1.24)
GFR, Estimated: 56 mL/min — ABNORMAL LOW (ref >60)
Glucose, Bld: 104 mg/dL — ABNORMAL HIGH (ref 70–99)
Potassium: 3.4 mmol/L — ABNORMAL LOW (ref 3.5–5.1)
Sodium: 140 mmol/L (ref 135–145)

## 2022-12-03 LAB — CBC
HCT: 37.6 % — ABNORMAL LOW (ref 39.0–52.0)
Hemoglobin: 12.7 g/dL — ABNORMAL LOW (ref 13.0–17.0)
MCH: 29.5 pg (ref 26.0–34.0)
MCHC: 33.8 g/dL (ref 30.0–36.0)
MCV: 87.2 fL (ref 80.0–100.0)
Platelets: 211 10*3/uL (ref 150–400)
RBC: 4.31 MIL/uL (ref 4.22–5.81)
RDW: 13.4 % (ref 11.5–15.5)
WBC: 7.4 10*3/uL (ref 4.0–10.5)
nRBC: 0 % (ref 0.0–0.2)

## 2022-12-03 LAB — GLUCOSE, CAPILLARY
Glucose-Capillary: 160 mg/dL — ABNORMAL HIGH (ref 70–99)
Glucose-Capillary: 92 mg/dL (ref 70–99)
Glucose-Capillary: 116 mg/dL — ABNORMAL HIGH (ref 70–99)

## 2022-12-03 LAB — BRAIN NATRIURETIC PEPTIDE: B Natriuretic Peptide: 1901.8 pg/mL — ABNORMAL HIGH (ref 0.0–100.0)

## 2022-12-03 MED ORDER — FUROSEMIDE 10 MG/ML IJ SOLN
40.0000 mg | Freq: Two times a day (BID) | INTRAMUSCULAR | Status: DC
  Administered 2022-12-03 – 2022-12-05 (×4): 40 mg via INTRAVENOUS
  Filled 2022-12-03 (×4): qty 4

## 2022-12-03 MED ORDER — AZITHROMYCIN 250 MG PO TABS
500.0000 mg | ORAL_TABLET | Freq: Every day | ORAL | Status: DC
  Administered 2022-12-03 – 2022-12-04 (×2): 500 mg via ORAL
  Filled 2022-12-03 (×2): qty 2

## 2022-12-03 MED ORDER — CARVEDILOL 12.5 MG PO TABS
12.5000 mg | ORAL_TABLET | Freq: Two times a day (BID) | ORAL | Status: DC
  Administered 2022-12-03 – 2022-12-04 (×3): 12.5 mg via ORAL
  Filled 2022-12-03 (×2): qty 1

## 2022-12-03 MED ORDER — POTASSIUM CHLORIDE CRYS ER 20 MEQ PO TBCR
40.0000 meq | EXTENDED_RELEASE_TABLET | Freq: Once | ORAL | Status: AC
  Administered 2022-12-03: 40 meq via ORAL
  Filled 2022-12-03: qty 2

## 2022-12-03 MED ORDER — CARVEDILOL 6.25 MG PO TABS
6.2500 mg | ORAL_TABLET | Freq: Once | ORAL | Status: AC
  Administered 2022-12-03: 6.25 mg via ORAL
  Filled 2022-12-03: qty 1

## 2022-12-03 NOTE — Progress Notes (Signed)
PROGRESS NOTE    Jeffery Barr  WUJ:[id number] DOB: Sep 23, 1940 DOA: 11/29/2022 PCP: [name], MD   Brief Narrative:  HPI:  Jeffery Barr is a 82 y.o. male with hx of CAD with history CABG, A-fib not on anticoagulation, TIA, hypertension, hyperlipidemia presented with weakness and back pain and was diagnosed with severe sepsis presumed to be UTI as well as acute hypoxic respiratory failure secondary to CHF, started on Rocephin, but now has elevated procalcitonin and rhonchi and chest x-ray raising suspicion for possible pneumonia.  Has elevated BNP as well.  Despite of being on Lasix, not significant diuresing.  Cardiology following and managing.  Assessment & Plan:   Principal Problem:   Urinary tract infection Active Problems:   NSTEMI (non-ST elevated myocardial infarction) (HCC)   Severe sepsis (HCC)   Hypoxic respiratory failure (HCC)   Acute on chronic diastolic heart failure (HCC)   Atrial fibrillation with RVR (HCC)   Acute hypoxic respiratory failure (HCC)  Severe sepsis secondary to UTI, POA: Patient meets criteria for severe sepsis based on fever, tachycardia, tachypnea and endorgan dysfunction such as AKI.  Has been started on vancomycin and cefepime.  No other source of infection other than UTI and thus antibiotics were narrowed to Rocephin.  All the cultures are negative so far and finally patient has remained afebrile for last 24 hours, with last temperature spike of 101.5 at midnight 12/01/2022 but no leukocytosis.  Patient clinically improving.  Will obtain CT abdomen pelvis to rule out pyelo if continues to have fever.  Acute hypoxic respiratory failure secondary to acute on chronic congestive heart failure with preserved ejection fraction and possibly community-acquired pneumonia: Patient required BiPAP for a brief period of time in the ED.  Currently weaned down to 2 L of oxygen.  Chest x-ray with pulmonary edema with elevated BNP.  Echo with normal ejection  fraction and no wall motion abnormality.  Patient received couple of doses of Lasix in the ED. I started him on Lasix 40 mg IV daily on 12/01/2022, has not had enough diuresis as expected.  Gave him extra dose of Lasix on 12/02/2022..  I's and outs not charted appropriately.  Still has rhonchi/crackles.  Chest x-ray repeated by cardiology shows possible pneumonia.  BNP also elevated at the same time.  May have combination of both.  Adding azithromycin to cover pneumonia.  He is on room air since yesterday.  NSTEMI ruled out: Significant elevated troponin around 2000s, per cardiology, this is demand ischemia and does not need any intervention since patient is asymptomatic.  Paroxysmal atrial fibrillation: Previous history of paroxysmal atrial fibrillation during episode of COVID, reportedly was on [facility] previously but could not afford.  Appears to have atrial fibrillation now but with controlled ventricular response.  Currently on Coreg and Eliquis.  CHA2DS2-VASc score 3.   Midline back pain: Initial concern was possible epidural abscess.  However MRI ruled out any signs of infection.  Continue current pain management.  PT OT.  History of hypertension: Blood pressure controlled, continue amlodipine and Coreg.  Dyslipidemia.  Low LDL.  Continue atorvastatin.  Obesity: BMI 32.04.  Will need counseling for weight loss and dietary modification at the time of discharge.  AKI ruled out, CKD stage IIIa ruled in: Creatinine at baseline is 1.3.  He is at baseline now.  Hypokalemia: Will replenish.  DVT prophylaxis: Eliquis   Code Status: Full Code  Family Communication: None present at bedside.  Plan of care discussed with patient in length  and he/she verbalized understanding and agreed with it.  Status is: Inpatient Remains inpatient appropriate because: Needs IV diuresis  Estimated body mass index is 31.42 kg/m as calculated from the following:   Height as of this encounter: 5\' 9"  (1.753 m).    Weight as of this encounter: 96.5 kg.    Nutritional Assessment: Body mass index is 31.42 kg/m.Marland Kitchen Seen by dietician.  I agree with the assessment and plan as outlined below: Nutrition Status:        . Skin Assessment: I have examined the patient's skin and I agree with the wound assessment as performed by the wound care RN as outlined below:    Consultants:  Cardiology  Procedures:  As above  Antimicrobials:  Anti-infectives (From admission, onward)    Start     Dose/Rate Route Frequency Ordered Stop   11/30/22 1700  vancomycin (VANCOREADY) IVPB 1250 mg/250 mL  Status:  Discontinued        1,250 mg 166.7 mL/hr over 90 Minutes Intravenous Every 24 hours 11/30/22 0721 11/30/22 0852   11/30/22 1600  cefTRIAXone (ROCEPHIN) 1 g in sodium chloride 0.9 % 100 mL IVPB        1 g 200 mL/hr over 30 Minutes Intravenous Every 24 hours 11/30/22 0852 12/05/22 1559   11/30/22 0415  ceFEPIme (MAXIPIME) 2 g in sodium chloride 0.9 % 100 mL IVPB  Status:  Discontinued        2 g 200 mL/hr over 30 Minutes Intravenous Every 12 hours 11/30/22 0358 11/30/22 0852   11/29/22 1630  vancomycin (VANCOCIN) IVPB 1000 mg/200 mL premix       Placed in "And" Linked Group   1,000 mg 200 mL/hr over 60 Minutes Intravenous  Once 11/29/22 1616 11/30/22 0327   11/29/22 1615  ceFEPIme (MAXIPIME) 2 g in sodium chloride 0.9 % 100 mL IVPB        2 g 200 mL/hr over 30 Minutes Intravenous  Once 11/29/22 1612 11/29/22 1748   11/29/22 1615  metroNIDAZOLE (FLAGYL) IVPB 500 mg        500 mg 100 mL/hr over 60 Minutes Intravenous  Once 11/29/22 1612 11/29/22 1857   11/29/22 1615  vancomycin (VANCOCIN) IVPB 1000 mg/200 mL premix  Status:  Discontinued        1,000 mg 200 mL/hr over 60 Minutes Intravenous  Once 11/29/22 1612 11/29/22 1613   11/29/22 1615  vancomycin (VANCOCIN) IVPB 1000 mg/200 mL premix       Placed in "And" Linked Group   1,000 mg 200 mL/hr over 60 Minutes Intravenous  Once 11/29/22 1616 11/29/22 1754          Subjective: Patient seen and examined.  He denies any complaint.  He is eager to go home.  Objective: Vitals:   12/03/22 0500 12/03/22 0518 12/03/22 0730 12/03/22 1148  BP:   (!) 165/86 (!) 137/55  Pulse:   80 72  Resp:   18 17  Temp:  (!) 97.5 F (36.4 C) 97.6 F (36.4 C) 97.6 F (36.4 C)  TempSrc:  Oral Oral Oral  SpO2:      Weight: 96.5 kg     Height:        Intake/Output Summary (Last 24 hours) at 12/03/2022 1421 Last data filed at 12/03/2022 1100 Gross per 24 hour  Intake 560 ml  Output 1800 ml  Net -1240 ml   Filed Weights   12/01/22 0513 12/02/22 1831 12/03/22 0500  Weight: 98.4 kg 96.7 kg 96.5 kg  Examination:  General exam: Appears calm and comfortable  Respiratory system: Rhonchi bilaterally with bibasilar crackles. Respiratory effort normal. Cardiovascular system: S1 & S2 heard, RRR. No JVD, murmurs, rubs, gallops or clicks. No pedal edema. Gastrointestinal system: Abdomen is nondistended, soft and nontender. No organomegaly or masses felt. Normal bowel sounds heard. Central nervous system: Alert and oriented. No focal neurological deficits. Extremities: Symmetric 5 x 5 power. Skin: No rashes, lesions or ulcers.  Psychiatry: Judgement and insight appear normal. Mood & affect appropriate.   Data Reviewed: I have personally reviewed following labs and imaging studies  CBC: Recent Labs  Lab 11/29/22 1605 11/30/22 0433 12/01/22 0321 12/02/22 0339 12/03/22 0427  WBC 9.8 10.1 9.3 8.1 7.4  HGB 14.2 12.4* 12.3* 12.2* 12.7*  HCT 42.1 37.7* 36.8* 36.4* 37.6*  MCV 85.9 88.5 84.8 87.7 87.2  PLT 154 145* 150 180 211   Basic Metabolic Panel: Recent Labs  Lab 11/29/22 1605 11/30/22 0433 12/01/22 0321 12/02/22 0339 12/03/22 0427  NA 135 136 135 138 140  K 3.7 3.7 3.5 3.6 3.4*  CL 98 105 103 105 104  CO2 21* 20* 21* 24 26  GLUCOSE 134* 146* 130* 107* 104*  BUN 19 18 31* 31* 33*  CREATININE 1.29* 1.49* 1.47* 1.32* 1.30*  CALCIUM 8.9  8.2* 8.0* 8.2* 8.6*  MG  --  1.9  --   --   --   PHOS  --  2.7  --   --   --    GFR: Estimated Creatinine Clearance: 51.9 mL/min (A) (by C-G formula based on SCr of 1.3 mg/dL (H)). Liver Function Tests: Recent Labs  Lab 11/29/22 1605  AST 24  ALT 16  ALKPHOS 53  BILITOT 1.1  PROT 7.3  ALBUMIN 3.5   No results for input(s): "LIPASE", "AMYLASE" in the last 168 hours. No results for input(s): "AMMONIA" in the last 168 hours. Coagulation Profile: No results for input(s): "INR", "PROTIME" in the last 168 hours. Cardiac Enzymes: No results for input(s): "CKTOTAL", "CKMB", "CKMBINDEX", "TROPONINI" in the last 168 hours. BNP (last 3 results) No results for input(s): "PROBNP" in the last 8760 hours. HbA1C: No results for input(s): "HGBA1C" in the last 72 hours.  CBG: Recent Labs  Lab 11/29/22 1556 12/03/22 1141  GLUCAP 143* 160*   Lipid Profile: No results for input(s): "CHOL", "HDL", "LDLCALC", "TRIG", "CHOLHDL", "LDLDIRECT" in the last 72 hours.  Thyroid Function Tests: No results for input(s): "TSH", "T4TOTAL", "FREET4", "T3FREE", "THYROIDAB" in the last 72 hours. Anemia Panel: No results for input(s): "VITAMINB12", "FOLATE", "FERRITIN", "TIBC", "IRON", "RETICCTPCT" in the last 72 hours. Sepsis Labs: Recent Labs  Lab 11/29/22 1605 11/29/22 1805 12/02/22 0335  PROCALCITON  --   --  0.99  LATICACIDVEN 2.0* 1.0  --     Recent Results (from the past 240 hour(s))  Blood Culture (routine x 2)     Status: None (Preliminary result)   Collection Time: 11/29/22  4:05 PM   Specimen: BLOOD  Result Value Ref Range Status   Specimen Description   Final    BLOOD RIGHT ANTECUBITAL Performed at [facility], [address] [place], [place] [address]    Special Requests   Final    BOTTLES DRAWN AEROBIC AND ANAEROBIC Blood Culture adequate volume Performed at [facility], [address], [place], [place] [address]    Culture   Final    NO GROWTH 4  DAYS Performed at [facility], [address] [place], [place] [address]    Report Status PENDING  Incomplete  Urine Culture     Status: None   Collection Time: 11/29/22  4:05 PM   Specimen: Urine, Random  Result Value Ref Range Status   Specimen Description   Final    URINE, RANDOM Performed at [facility], [address] [place], [place] [address]    Special Requests   Final    NONE Reflexed from [id number] Performed at [facility], [address] [place], [place] [address]    Culture   Final    NO GROWTH Performed at [facility], [address] [place], [place] [address]    Report Status 12/01/2022 FINAL  Final  Resp panel by RT-PCR (RSV, Flu A&B, Covid)     Status: None   Collection Time: 11/29/22  4:26 PM   Specimen: Nasal Swab  Result Value Ref Range Status   SARS Coronavirus 2 by RT PCR NEGATIVE NEGATIVE Final    Comment: (NOTE) SARS-CoV-2 target nucleic acids are NOT DETECTED.  The SARS-CoV-2 RNA is generally detectable in upper respiratory specimens during the acute phase of infection. The lowest concentration of SARS-CoV-2 viral copies this assay can detect is 138 copies/mL. A negative result does not preclude SARS-Cov-2 infection and should not be used as the sole basis for treatment or other patient management decisions. A negative result may occur with  improper specimen collection/handling, submission of specimen other than nasopharyngeal swab, presence of viral mutation(s) within the areas targeted by this assay, and inadequate number of viral copies(<138 copies/mL). A negative result must be combined with clinical observations, patient history, and epidemiological information. The expected result is Negative.  Fact Sheet for Patients:  BloggerCourse.com  Fact Sheet for Healthcare Providers:  SeriousBroker.it  This test is no t yet approved or cleared by the [place] FDA and  has been authorized for detection and/or diagnosis of SARS-CoV-2 by FDA under an Emergency Use Authorization (EUA). This EUA will remain  in effect (meaning this test can be used) for the duration of the COVID-19 declaration under Section 564(b)(1) of the Act, 21 U.S.C.section 360bbb-3(b)(1), unless the authorization is terminated  or revoked sooner.       Influenza A by PCR NEGATIVE NEGATIVE Final   Influenza B by PCR NEGATIVE NEGATIVE Final    Comment: (NOTE) The Xpert Xpress SARS-CoV-2/FLU/RSV plus assay is intended as an aid in the diagnosis of influenza from Nasopharyngeal swab specimens and should not be used as a sole basis for treatment. Nasal washings and aspirates are unacceptable for Xpert Xpress SARS-CoV-2/FLU/RSV testing.  Fact Sheet for Patients: BloggerCourse.com  Fact Sheet for Healthcare Providers: SeriousBroker.it  This test is not yet approved or cleared by the [place] FDA and has been authorized for detection and/or diagnosis of SARS-CoV-2 by FDA under an Emergency Use Authorization (EUA). This EUA will remain in effect (meaning this test can be used) for the duration of the COVID-19 declaration under Section 564(b)(1) of the Act, 21 U.S.C. section 360bbb-3(b)(1), unless the authorization is terminated or revoked.     Resp Syncytial Virus by PCR NEGATIVE NEGATIVE Final    Comment: (NOTE) Fact Sheet for Patients: BloggerCourse.com  Fact Sheet for Healthcare Providers: SeriousBroker.it  This test is not yet approved or cleared by the [place] FDA and has been authorized for detection and/or diagnosis of SARS-CoV-2 by FDA under an Emergency Use Authorization (EUA). This EUA will remain in  effect (meaning this test can be used) for the duration of the COVID-19 declaration under Section 564(b)(1) of the Act, 21 U.S.C. section  360bbb-3(b)(1), unless the authorization is terminated or revoked.  Performed at [facility], [address] [place], [place] [address]   Blood Culture (routine x 2)     Status: None (Preliminary result)   Collection Time: 11/29/22  4:26 PM   Specimen: BLOOD  Result Value Ref Range Status   Specimen Description   Final    BLOOD LEFT ANTECUBITAL Performed at [facility], [address] [place], [place] [address]    Special Requests   Final    BOTTLES DRAWN AEROBIC AND ANAEROBIC Blood Culture results may not be optimal due to an inadequate volume of blood received in culture bottles Performed at [facility], [address] [place], [place] [address]    Culture   Final    NO GROWTH 4 DAYS Performed at [facility], [address] [place], [place] [address]    Report Status PENDING  Incomplete  SARS Coronavirus 2 by RT PCR (hospital order, performed in [facility] hospital lab) *cepheid single result test* Anterior Nasal Swab     Status: None   Collection Time: 11/30/22 12:03 AM   Specimen: Anterior Nasal Swab  Result Value Ref Range Status   SARS Coronavirus 2 by RT PCR NEGATIVE NEGATIVE Final    Comment: Performed at [facility], [address] [place], [place] [address]  MRSA Next Gen by PCR, Nasal     Status: None   Collection Time: 12/02/22  6:31 PM   Specimen: Nasal Mucosa; Nasal Swab  Result Value Ref Range Status   MRSA by PCR Next Gen NOT DETECTED NOT DETECTED Final    Comment: (NOTE) The GeneXpert MRSA Assay (FDA approved for NASAL specimens only), is one component of a comprehensive MRSA colonization surveillance program. It is not intended to diagnose MRSA infection nor to guide or monitor treatment for MRSA infections. Test performance is not FDA approved in patients less than 58 years old. Performed at [facility], [address] [place], [place] [address]      Radiology Studies: DG Chest 2  View  Result Date: 12/03/2022 CLINICAL DATA:  Congestive heart failure EXAM: CHEST - 2 VIEW COMPARISON:  12/02/2022 FINDINGS: Heart size is within normal limits. No pulmonary vascular congestion. Unchanged elevation of right hemidiaphragm. Focal airspace opacity appears increased at the left lung base. IMPRESSION: Interval increase of left basilar airspace opacity may be due to atelectasis or pneumonia. Electronically Signed   By: Acquanetta Belling M.D.   On: 12/03/2022 13:26   DG CHEST PORT 1 VIEW  Result Date: 12/02/2022 CLINICAL DATA:  Congestive heart failure. EXAM: PORTABLE CHEST 1 VIEW COMPARISON:  Chest radiograph dated 11/30/2022. FINDINGS: There is mild eventration of the right hemidiaphragm with right lung base atelectasis. Stable cardiomegaly with improved CHF since the prior radiograph. Small bilateral pleural effusions suspected. No pneumothorax. Median sternotomy wires. No acute osseous pathology. IMPRESSION: Cardiomegaly with improved CHF. Electronically Signed   By: Elgie Collard M.D.   On: 12/02/2022 15:11    Scheduled Meds:  amLODipine  5 mg Oral Daily   apixaban  5 mg Oral BID   aspirin EC  81 mg Oral Daily   carvedilol  12.5 mg Oral BID   ezetimibe  10 mg Oral Daily   furosemide  40 mg Intravenous  Daily   potassium chloride  10 mEq Oral BID   rosuvastatin  40 mg Oral Daily   sodium chloride flush  3 mL Intravenous Q12H   Continuous Infusions:  cefTRIAXone (ROCEPHIN)  IV 1 g (12/02/22 1512)     LOS: 3 days   Total time spent: 38 minutes  Hughie Closs, MD [facility]  12/03/2022, 2:21 PM   *Please note that this is a verbal dictation therefore any spelling or grammatical errors are due to the "Dragon Medical One" system interpretation.  Please page via Amion and do not message via secure chat for urgent patient care matters. Secure chat can be used for non urgent patient care matters.  How to contact the [facility] Attending or Consulting provider 7A - 7P or  covering provider during after hours 7P -7A, for this patient?  Check the care team in [facility] and look for a) attending/consulting TRH provider listed and b) the [facility] team listed. Page or secure chat 7A-7P. Log into [email or web address] and use [facility] universal password to access. If you do not have the password, please contact the hospital operator. Locate the [facility] provider you are looking for under [facility] and page to a number that you can be directly reached. If you still have difficulty reaching the provider, please page the [facility] (Director on Call) for the Hospitalists listed on amion for assistance.

## 2022-12-03 NOTE — TOC Initial Note (Addendum)
Transition of Care [facility]) - Initial/Assessment Note    Patient Details  Name: Jeffery Barr MRN: [id number] Date of Birth: September 03, 1940  Transition of Care [facility]) CM/SW Contact:    Leone Haven, RN Phone Number: 12/03/2022, 1:46 PM  Clinical Narrative:                 From home with spouse, has PCP and insurance on file, states has no HH services in place at this time or DME at home.  States wife will transport him home at [employer] and she is support system, states [facility] [place].  Pta self ambulatory. He is set up with outpatient physical therapy on [facility].  Expected Discharge Plan: Home/Self Care Barriers to Discharge: No Barriers Identified   Patient Goals and CMS Choice Patient states their goals for this hospitalization and ongoing recovery are:: return home   Choice offered to / list presented to : NA      Expected Discharge Plan and Services In-house Referral: NA Discharge Planning Services: CM Consult Post Acute Care Choice: NA Living arrangements for the past 2 months: Single Family Home                 DME Arranged: N/A DME Agency: NA       HH Arranged: NA          Prior Living Arrangements/Services Living arrangements for the past 2 months: Single Family Home Lives with:: Spouse Patient language and need for interpreter reviewed:: Yes Do you feel safe going back to the place where you live?: Yes      Need for Family Participation in Patient Care: Yes (Comment) Care giver support system in place?: Yes (comment)   Criminal Activity/Legal Involvement Pertinent to Current Situation/Hospitalization: No - Comment as needed  Activities of Daily Living   ADL Screening (condition at time of admission) Independently performs ADLs?: No Does the patient have a NEW difficulty with bathing/dressing/toileting/self-feeding that is expected to last >3 days?: No Does the patient have a NEW difficulty with getting in/out of bed, walking, or  climbing stairs that is expected to last >3 days?: Yes (Initiates electronic notice to provider for possible PT consult) Does the patient have a NEW difficulty with communication that is expected to last >3 days?: No Is the patient deaf or have difficulty hearing?: Yes Does the patient have difficulty seeing, even when wearing glasses/contacts?: No Does the patient have difficulty concentrating, remembering, or making decisions?: No  Permission Sought/Granted Permission sought to share information with : Case Manager Permission granted to share information with : Yes, Verbal Permission Granted              Emotional Assessment Appearance:: Appears stated age Attitude/Demeanor/Rapport: Engaged Affect (typically observed): Appropriate Orientation: : Oriented to Self, Oriented to Place, Oriented to  Time, Oriented to Situation Alcohol / Substance Use: Not Applicable Psych Involvement: No (comment)  Admission diagnosis:  Urinary tract infection [N39.0] Acute cystitis without hematuria [N30.00] Atrial fibrillation with RVR (HCC) [I48.91] Acute midline low back pain without sciatica [M54.50] Sepsis with acute organ dysfunction without septic shock, due to unspecified organism, unspecified organ dysfunction type (HCC) [A41.9, R65.20] Patient Active Problem List   Diagnosis Date Noted   Urinary tract infection 11/30/2022   NSTEMI (non-ST elevated myocardial infarction) (HCC) 11/30/2022   Severe sepsis (HCC) 11/30/2022   Hypoxic respiratory failure (HCC) 11/30/2022   Acute on chronic diastolic heart failure (HCC) 11/30/2022   Atrial fibrillation with RVR (HCC) 11/30/2022  Acute hypoxic respiratory failure (HCC) 11/30/2022   Persistent atrial fibrillation (HCC) 08/27/2021   Benign essential hypertension 08/27/2021   Other thrombophilia (HCC) 08/27/2021   Pure hypercholesterolemia 08/27/2021   Acute respiratory failure with hypoxia (HCC) 07/22/2021   Transaminitis 07/22/2021    Generalized weakness 07/22/2021   Paroxysmal atrial fibrillation (HCC) 07/22/2021   Chronic systolic CHF (congestive heart failure) (HCC) 07/22/2021   Pneumonia due to COVID-19 virus 07/21/2021   TIA (transient ischemic attack) 05/30/2021   CAD (coronary artery disease) 11/26/2012   HTN (hypertension) 11/26/2012   Obesity (BMI 30-39.9) 11/26/2012   Dyslipidemia 11/26/2012   PCP:  [name], MD Pharmacy:   [phone] [place] [place] [facility] [address] [place] [address] Phone: [phone] Fax: [phone]  [facility] [address] [place] [address] Phone: [phone] Fax: [phone]     Social Determinants of Health (SDOH) Social History: SDOH Screenings   Food Insecurity: No Food Insecurity (11/30/2022)  Housing: Low Risk  (11/30/2022)  Transportation Needs: No Transportation Needs (11/30/2022)  Utilities: Not At Risk (11/30/2022)  Tobacco Use: Medium Risk (11/29/2022)   SDOH Interventions:     Readmission Risk Interventions     No data to display

## 2022-12-03 NOTE — Progress Notes (Signed)
Rounding Note    Patient Name: Jeffery Barr Date of Encounter: 12/03/2022   [facility] Cardiologist: Armanda Magic, MD    Subjective    Admitted with urosepsis and concern for epidural abscess which has been ruled out Noted to be in afib with CVR  Troponins are + with flat trend [phone])  Denies any CP or SOB.  2D echo with normal LVF   Inpatient Medications    Scheduled Meds:  amLODipine  5 mg Oral Daily   apixaban  5 mg Oral BID   aspirin EC  81 mg Oral Daily   carvedilol  6.25 mg Oral BID   ezetimibe  10 mg Oral Daily   furosemide  40 mg Intravenous Daily   potassium chloride  10 mEq Oral BID   potassium chloride  40 mEq Oral Once   rosuvastatin  40 mg Oral Daily   sodium chloride flush  3 mL Intravenous Q12H   Continuous Infusions:  cefTRIAXone (ROCEPHIN)  IV 1 g (12/02/22 1512)   PRN Meds: acetaminophen, albuterol   Vital Signs    Vitals:   12/03/22 0018 12/03/22 0500 12/03/22 0518 12/03/22 0730  BP: 122/77   (!) 165/86  Pulse: 75   80  Resp: 20   18  Temp: 98.4 F (36.9 C)  (!) 97.5 F (36.4 C) 97.6 F (36.4 C)  TempSrc: Oral  Oral Oral  SpO2: 93%     Weight:  96.5 kg    Height:        Intake/Output Summary (Last 24 hours) at 12/03/2022 1027 Last data filed at 12/03/2022 0700 Gross per 24 hour  Intake 800 ml  Output 700 ml  Net 100 ml      12/03/2022    5:00 AM 12/02/2022    6:31 PM 12/01/2022    5:13 AM  Last 3 Weights  Weight (lbs) 212 lb 11.9 oz 213 lb 3 oz 216 lb 14.9 oz  Weight (kg) 96.5 kg 96.7 kg 98.4 kg      Telemetry   Atrial fibrillation with CVR, PVCs and ventricular triplets and couplets  ECG     No new EKG to review- Personally Reviewed  Physical Exam   Physical Exam: Blood pressure (!) 165/86, pulse 80, temperature 97.6 F (36.4 C), temperature source Oral, resp. rate 18, height 5\' 9"  (1.753 m), weight 96.5 kg, SpO2 93%.  GEN: Well nourished, well developed in no acute  distress HEENT: Normal NECK: No JVD; No carotid bruits LYMPHATICS: No lymphadenopathy CARDIAC:irregularly irregular, no murmurs, rubs, gallops RESPIRATORY:  Clear to auscultation without rales, wheezing or rhonchi  ABDOMEN: Soft, non-tender, non-distended MUSCULOSKELETAL:  No edema; No deformity  SKIN: Warm and dry NEUROLOGIC:  Alert and oriented x 3 PSYCHIATRIC:  Normal affect  Labs    High Sensitivity Troponin:   Recent Labs  Lab 11/30/22 0433 11/30/22 0844 11/30/22 1040  TROPONINIHS 2,083* 2,564* 2,041*     Chemistry Recent Labs  Lab 11/29/22 1605 11/30/22 0433 12/01/22 0321 12/02/22 0339 12/03/22 0427  NA 135 136 135 138 140  K 3.7 3.7 3.5 3.6 3.4*  CL 98 105 103 105 104  CO2 21* 20* 21* 24 26  GLUCOSE 134* 146* 130* 107* 104*  BUN 19 18 31* 31* 33*  CREATININE 1.29* 1.49* 1.47* 1.32* 1.30*  CALCIUM 8.9 8.2* 8.0* 8.2* 8.6*  MG  --  1.9  --   --   --   PROT 7.3  --   --   --   --  ALBUMIN 3.5  --   --   --   --   AST 24  --   --   --   --   ALT 16  --   --   --   --   ALKPHOS 53  --   --   --   --   BILITOT 1.1  --   --   --   --   GFRNONAA 56* 47* 48* 55* 56*  ANIONGAP 16* 11 11 9 10     Lipids  Recent Labs  Lab 11/30/22 0844  CHOL 135  TRIG 65  HDL 38*  LDLCALC 84  CHOLHDL 3.6    Hematology Recent Labs  Lab 12/01/22 0321 12/02/22 0339 12/03/22 0427  WBC 9.3 8.1 7.4  RBC 4.34 4.15* 4.31  HGB 12.3* 12.2* 12.7*  HCT 36.8* 36.4* 37.6*  MCV 84.8 87.7 87.2  MCH 28.3 29.4 29.5  MCHC 33.4 33.5 33.8  RDW 13.6 13.6 13.4  PLT 150 180 211   Thyroid No results for input(s): "TSH", "FREET4" in the last 168 hours.  BNP Recent Labs  Lab 11/29/22 1612  BNP 699.5*    DDimer No results for input(s): "DDIMER" in the last 168 hours.   Radiology    DG CHEST PORT 1 VIEW  Result Date: 12/02/2022 CLINICAL DATA:  Congestive heart failure. EXAM: PORTABLE CHEST 1 VIEW COMPARISON:  Chest radiograph dated 11/30/2022. FINDINGS: There is mild eventration of  the right hemidiaphragm with right lung base atelectasis. Stable cardiomegaly with improved CHF since the prior radiograph. Small bilateral pleural effusions suspected. No pneumothorax. Median sternotomy wires. No acute osseous pathology. IMPRESSION: Cardiomegaly with improved CHF. Electronically Signed   By: Elgie Collard M.D.   On: 12/02/2022 15:11    Cardiac Studies    Patient Profile     82 y.o. male   with a hx of coronary disease status post CABG in 2002, hypertension, dyslipidemia, obesity and paroxysmal atrial fibrillation with former tobacco abuse who is being seen today for the evaluation of elevated troponin in setting of urosepsis as well as afib with RVR at the request of ED.   Assessment & Plan    #UTI/Urosepsis -pt presents with UTI , profound weakness , no CP  -per TRH  #Elevated Troponin -Troponins were incidentally found to be elevated [phone] -likely related to demand ischemia and not ACS -2D echo with normal LVF -had not had any CP>>no further ischemic workup this admission  #CAD  -s/p remote CABG 2002 -Elevated Troponin incidentally found to be elevated (([phone] but not c/w ACS and likely related to demand ischemia from urosepsis -denies any CP -echo this admit with normal LVF -At this point, in the absence of CP and normal LVF with no focal wall motion abnormalities, would pursue conservative therapy for now and plan on outpt Stress PET CT to rule out ischemia -continue ASA 81mg  daily, Carvedilol 6.25mg  BID, Crestor 40mg  daily and Zetia 10mg  daily -LDL 84 this admit>>Crestor increased to 40mg  daily -will need FLP and ALT in 6 weeks  #Acute hypoxemic respiratory failure #Acute Diastolic CHF -required BIPAP transiently -2D echo with normal LVF -BNP elevated -Given lasix IV 40mg  IV but I&O's are incomplete -weight down 8lbs from admit -SCr stable at 1.3 -Now on Lasix 40mg  IV BID -I&O's incomplete -he has crackles at bases that only  partially clear with deep cough>>? Atelectasis vx. Persistent edema -will check a Cxray and BNP and if not c/w volume overload then can transition  to Lasix 40mg  daily -do not resume home Chlorthalidone -no SGLT2i due to UTI   #PAF/MAT -has hx of PAF in the past in setting of COVID 19 and also has hx of TIA -apparently had been on [facility] in the past but could not afford it -heart rate controlled on tele today -increasing Carvedilol to 12.5mg  BID due to elevated BP -Pharmacy working application for financial help getting Eliquis  -started on Eliquis 5mg  BID and will give patient 30 day free sample card at discharge  #HTN -BP elevated at 165/61mmHg this am -continue Amlodipine 5mg  daily  -increase Carvedilol to 12.5mg  BID  Total time spent with patient today 35 minutes. This includes reviewing records, discussion with PharmD about financial restrains for anticoagulation, review of 2D echo, evaluating the patient and coordinating care. Face-to-face time >50%.   For questions or updates, please contact  [facility] Please consult [email or web address] for contact info under        Signed, Armanda Magic, MD  12/03/2022, 10:27 AM

## 2022-12-03 NOTE — Care Management Important Message (Signed)
Important Message  Patient Details  Name: Jeffery Barr MRN: [id number] Date of Birth: 08/21/1940   Important Message Given:  Yes - Medicare IM     Dorena Bodo 12/03/2022, 2:35 PM

## 2022-12-03 NOTE — Plan of Care (Signed)
Problem: Elimination: Goal: Will not experience complications related to bowel motility Outcome: Adequate for Discharge

## 2022-12-04 ENCOUNTER — Telehealth: Payer: Self-pay

## 2022-12-04 ENCOUNTER — Other Ambulatory Visit (HOSPITAL_COMMUNITY): Payer: Self-pay

## 2022-12-04 DIAGNOSIS — A419 Sepsis, unspecified organism: Secondary | ICD-10-CM | POA: Diagnosis not present

## 2022-12-04 DIAGNOSIS — E66811 Obesity, class 1: Secondary | ICD-10-CM

## 2022-12-04 DIAGNOSIS — I10 Essential (primary) hypertension: Secondary | ICD-10-CM

## 2022-12-04 DIAGNOSIS — I2510 Atherosclerotic heart disease of native coronary artery without angina pectoris: Secondary | ICD-10-CM | POA: Diagnosis not present

## 2022-12-04 DIAGNOSIS — I4891 Unspecified atrial fibrillation: Secondary | ICD-10-CM

## 2022-12-04 DIAGNOSIS — I5033 Acute on chronic diastolic (congestive) heart failure: Secondary | ICD-10-CM

## 2022-12-04 DIAGNOSIS — N189 Chronic kidney disease, unspecified: Secondary | ICD-10-CM | POA: Insufficient documentation

## 2022-12-04 DIAGNOSIS — N390 Urinary tract infection, site not specified: Secondary | ICD-10-CM

## 2022-12-04 DIAGNOSIS — N179 Acute kidney failure, unspecified: Secondary | ICD-10-CM

## 2022-12-04 DIAGNOSIS — E7800 Pure hypercholesterolemia, unspecified: Secondary | ICD-10-CM | POA: Diagnosis not present

## 2022-12-04 DIAGNOSIS — R7989 Other specified abnormal findings of blood chemistry: Secondary | ICD-10-CM | POA: Diagnosis not present

## 2022-12-04 HISTORY — DX: Chronic kidney disease, unspecified: N17.9

## 2022-12-04 HISTORY — DX: Obesity, class 1: E66.811

## 2022-12-04 LAB — BASIC METABOLIC PANEL
Anion gap: 11 (ref 5–15)
BUN: 27 mg/dL — ABNORMAL HIGH (ref 8–23)
CO2: 28 mmol/L (ref 22–32)
Calcium: 9 mg/dL (ref 8.9–10.3)
Chloride: 105 mmol/L (ref 98–111)
Creatinine, Ser: 1.3 mg/dL — ABNORMAL HIGH (ref 0.61–1.24)
GFR, Estimated: 56 mL/min — ABNORMAL LOW (ref >60)
Glucose, Bld: 128 mg/dL — ABNORMAL HIGH (ref 70–99)
Potassium: 3.5 mmol/L (ref 3.5–5.1)
Sodium: 144 mmol/L (ref 135–145)

## 2022-12-04 LAB — CULTURE, BLOOD (ROUTINE X 2)
Culture: NO GROWTH
Culture: NO GROWTH
Special Requests: ADEQUATE

## 2022-12-04 LAB — GLUCOSE, CAPILLARY: Glucose-Capillary: 109 mg/dL — ABNORMAL HIGH (ref 70–99)

## 2022-12-04 LAB — PROCALCITONIN: Procalcitonin: 0.29 ng/mL

## 2022-12-04 LAB — MAGNESIUM: Magnesium: 2.1 mg/dL (ref 1.7–2.4)

## 2022-12-04 MED ORDER — CARVEDILOL 25 MG PO TABS
25.0000 mg | ORAL_TABLET | Freq: Two times a day (BID) | ORAL | Status: DC
  Administered 2022-12-04 – 2022-12-05 (×2): 25 mg via ORAL
  Filled 2022-12-04 (×2): qty 1

## 2022-12-04 MED ORDER — CARVEDILOL 12.5 MG PO TABS
12.5000 mg | ORAL_TABLET | Freq: Once | ORAL | Status: AC
  Administered 2022-12-04: 12.5 mg via ORAL
  Filled 2022-12-04: qty 1

## 2022-12-04 MED ORDER — POTASSIUM CHLORIDE CRYS ER 20 MEQ PO TBCR
20.0000 meq | EXTENDED_RELEASE_TABLET | Freq: Once | ORAL | Status: AC
  Administered 2022-12-04: 20 meq via ORAL
  Filled 2022-12-04: qty 1

## 2022-12-04 NOTE — Progress Notes (Signed)
Occupational Therapy Treatment Patient Details Name: Jeffery Barr MRN: [id number] DOB: [birth year] Today's Date: 12/04/2022   History of present illness Pt is an 82 y.o. male presenting 10/11 with back pain and LE weakness. Severe sepsis secondary to UTI . MRI ruled out any signs of infection lower back. Acute hypoxic respiratory failure secondary to acute on chronic congestive heart failure. Concern for NSTEMI. PMH significant for CAD s/p CBG, HTN, HLD, paroxysmal A-fib.   OT comments  Pt progressing toward established OT goals. Performing toilet transfer and toileting with supervision for safety. Session limited secondary to onset dizziness standing by sink and need to sit. Dizziness did not resolve seated in chair so moved next to bed and called for assistance for pt safety. Upon arrival of assist pt with decr level of arousal and needing max A for back to bed. Upon supine pt quickly recovering and speaking full sentences. Pt MAP 89 but systolic BP in 90s when first returned to supine. Encouraged to hydrate this afternoon. Will continue to follow. Current POC remains appropriate.       If plan is discharge home, recommend the following:  Direct supervision/assist for financial management;Direct supervision/assist for medications management;Assistance with cooking/housework;Assist for transportation   Equipment Recommendations  None recommended by OT    Recommendations for Other Services      Precautions / Restrictions Precautions Precautions: Fall Restrictions Weight Bearing Restrictions: No       Mobility Bed Mobility Overal bed mobility: Modified Independent             General bed mobility comments: mod I to get OOB this session; however, pt was transferred back to bed with max A from chair secondary to dizziness.    Transfers Overall transfer level: Needs assistance Equipment used: Rolling walker (2 wheels) Transfers: Sit to/from Stand Sit to Stand: Supervision            General transfer comment: Supervision for safety     Balance Overall balance assessment: Mild deficits observed, not formally tested                                         ADL either performed or assessed with clinical judgement   ADL Overall ADL's : Needs assistance/impaired                         Toilet Transfer: Supervision/safety;Ambulation   Toileting- Clothing Manipulation and Hygiene: Supervision/safety;Sitting/lateral lean       Functional mobility during ADLs: Supervision/safety      Extremity/Trunk Assessment              Vision       Perception     Praxis      Cognition Arousal: Alert Behavior During Therapy: WFL for tasks assessed/performed Overall Cognitive Status: Impaired/Different from baseline Area of Impairment: Orientation, Attention, Following commands, Problem solving, Memory, Awareness                 Orientation Level: Disoriented to, Time Current Attention Level: Sustained Memory: Decreased short-term memory Following Commands: Follows one step commands consistently, Follows one step commands with increased time, Follows multi-step commands inconsistently   Awareness: Intellectual Problem Solving: Slow processing, Requires verbal cues General Comments: Cognitive assessment limited this session secondary to dizziness.        Exercises      Shoulder Instructions  General Comments after trip to restroom, OT changing sheets with pt standing nearby on his request to stand rather than sit and pt reporting dizziness. Chair immediately put behind pt to sit and then transitioned chair next to bed. Pt speaking in 1 word statements so RN asked to come as pt reporting he cannot get in bed. RN, OT, and NT max A pt back to bed. pt systolic BP in 90s with MAP 89; recovered and speaking full sentences with no confusion within ~1.5 minutes. Encouraged to hydrate and ensure staff is with him for  OOB    Pertinent Vitals/ Pain       Pain Assessment Pain Assessment: Faces Faces Pain Scale: Hurts a little bit Pain Location: back Pain Descriptors / Indicators: Aching Pain Intervention(s): Limited activity within patient's tolerance, Monitored during session  Home Living                                          Prior Functioning/Environment              Frequency  Min 1X/week        Progress Toward Goals  OT Goals(current goals can now be found in the care plan section)  Progress towards OT goals: Progressing toward goals  Acute Rehab OT Goals Patient Stated Goal: go home OT Goal Formulation: With patient Time For Goal Achievement: 12/15/22 Potential to Achieve Goals: Good ADL Goals Pt Will Perform Grooming: with modified independence;standing Pt Will Perform Lower Body Dressing: with modified independence;sit to/from stand Pt Will Transfer to Toilet: with modified independence;ambulating Additional ADL Goal #1: Pt will follow 3 step commands with increased time. Additional ADL Goal #2: Pt will demonstrate use of compensatory techniques for memory.  Plan      Co-evaluation                 AM-PAC OT "6 Clicks" Daily Activity     Outcome Measure   Help from another person eating meals?: None Help from another person taking care of personal grooming?: A Little Help from another person toileting, which includes using toliet, bedpan, or urinal?: A Little Help from another person bathing (including washing, rinsing, drying)?: A Little Help from another person to put on and taking off regular upper body clothing?: A Little Help from another person to put on and taking off regular lower body clothing?: A Little 6 Click Score: 19    End of Session Equipment Utilized During Treatment: Gait belt  OT Visit Diagnosis: Unsteadiness on feet (R26.81);Other symptoms and signs involving cognitive function   Activity Tolerance Patient tolerated  treatment well   Patient Left with call bell/phone within reach;with family/visitor present;with chair alarm set;in bed   Nurse Communication          Time: 1610-9604 OT Time Calculation (min): 32 min  Charges: OT General Charges $OT Visit: 1 Visit OT Treatments $Self Care/Home Management : 23-37 mins  Tyler Deis, OTR/L [facility] Acute Rehabilitation Office: [phone]   Myrla Halsted 12/04/2022, 3:26 PM

## 2022-12-04 NOTE — Assessment & Plan Note (Deleted)
No acute coronary syndrome. Continue blood pressure control with carvedilol and amlodipine

## 2022-12-04 NOTE — Assessment & Plan Note (Addendum)
Sepsis present on admission. Severe sepsis with lactic acidosis.  Cultures have been no growth. Wbc at 7,4 and he has bee afebrile.   Patient has completed antibiotic therapy. Sepsis has resolved.

## 2022-12-04 NOTE — Progress Notes (Signed)
Rounding Note    Patient Name: Jeffery Barr Date of Encounter: 12/04/2022  [facility] Cardiologist: Armanda Magic, MD    Subjective    Admitted with urosepsis and concern for epidural abscess which has been ruled out Noted to be in afib with CVR.    Troponins are + with flat trend [phone]  No CP or SOB  2D echo with normal LVF  BNP yesterday elevated at 2000 with crackles on exam.  Cxray c/w PNA so TRH broadened antibx Remains afebrile   Inpatient Medications    Scheduled Meds:  amLODipine  5 mg Oral Daily   apixaban  5 mg Oral BID   aspirin EC  81 mg Oral Daily   azithromycin  500 mg Oral Daily   carvedilol  12.5 mg Oral BID   ezetimibe  10 mg Oral Daily   furosemide  40 mg Intravenous BID   potassium chloride  10 mEq Oral BID   rosuvastatin  40 mg Oral Daily   sodium chloride flush  3 mL Intravenous Q12H   Continuous Infusions:  cefTRIAXone (ROCEPHIN)  IV 1 g (12/03/22 1434)   PRN Meds: acetaminophen, albuterol   Vital Signs    Vitals:   12/03/22 2020 12/04/22 0016 12/04/22 0438 12/04/22 0749  BP: 135/73 (!) 149/85 (!) 162/81 (!) 168/80  Pulse: 81 73 78 81  Resp: 20 18 19 19   Temp: 98.1 F (36.7 C) 98 F (36.7 C) 97.9 F (36.6 C) 98.5 F (36.9 C)  TempSrc: Oral Oral Oral Oral  SpO2: 94% 94% 94% 91%  Weight:      Height:        Intake/Output Summary (Last 24 hours) at 12/04/2022 1028 Last data filed at 12/04/2022 0836 Gross per 24 hour  Intake 340 ml  Output 2250 ml  Net -1910 ml      12/03/2022    5:00 AM 12/02/2022    6:31 PM 12/01/2022    5:13 AM  Last 3 Weights  Weight (lbs) 212 lb 11.9 oz 213 lb 3 oz 216 lb 14.9 oz  Weight (kg) 96.5 kg 96.7 kg 98.4 kg      Telemetry   Afib with CVR  ECG     No new EKG to review- Personally Reviewed  Physical Exam   Physical Exam: Blood pressure (!) 168/80, pulse 81, temperature 98.5 F (36.9 C), temperature source Oral, resp. rate 19, height 5\' 9"  (1.753 m),  weight 96.5 kg, SpO2 91%.  GEN: Well nourished, well developed in no acute distress HEENT: Normal NECK: No JVD; No carotid bruits LYMPHATICS: No lymphadenopathy CARDIAC:irregularly irregular, no murmurs, rubs, gallops RESPIRATORY:  crackles at bases but improved from yesterday ABDOMEN: Soft, non-tender, non-distended MUSCULOSKELETAL:  No edema; No deformity  SKIN: Warm and dry NEUROLOGIC:  Alert and oriented x 3 PSYCHIATRIC:  Normal affect  Labs    High Sensitivity Troponin:   Recent Labs  Lab 11/30/22 0433 11/30/22 0844 11/30/22 1040  TROPONINIHS 2,083* 2,564* 2,041*     Chemistry Recent Labs  Lab 11/29/22 1605 11/30/22 0433 12/01/22 0321 12/02/22 0339 12/03/22 0427  NA 135 136 135 138 140  K 3.7 3.7 3.5 3.6 3.4*  CL 98 105 103 105 104  CO2 21* 20* 21* 24 26  GLUCOSE 134* 146* 130* 107* 104*  BUN 19 18 31* 31* 33*  CREATININE 1.29* 1.49* 1.47* 1.32* 1.30*  CALCIUM 8.9 8.2* 8.0* 8.2* 8.6*  MG  --  1.9  --   --   --  PROT 7.3  --   --   --   --   ALBUMIN 3.5  --   --   --   --   AST 24  --   --   --   --   ALT 16  --   --   --   --   ALKPHOS 53  --   --   --   --   BILITOT 1.1  --   --   --   --   GFRNONAA 56* 47* 48* 55* 56*  ANIONGAP 16* 11 11 9 10     Lipids  Recent Labs  Lab 11/30/22 0844  CHOL 135  TRIG 65  HDL 38*  LDLCALC 84  CHOLHDL 3.6    Hematology Recent Labs  Lab 12/01/22 0321 12/02/22 0339 12/03/22 0427  WBC 9.3 8.1 7.4  RBC 4.34 4.15* 4.31  HGB 12.3* 12.2* 12.7*  HCT 36.8* 36.4* 37.6*  MCV 84.8 87.7 87.2  MCH 28.3 29.4 29.5  MCHC 33.4 33.5 33.8  RDW 13.6 13.6 13.4  PLT 150 180 211   Thyroid No results for input(s): "TSH", "FREET4" in the last 168 hours.  BNP Recent Labs  Lab 11/29/22 1612 12/03/22 1055  BNP 699.5* 1,901.8*    DDimer No results for input(s): "DDIMER" in the last 168 hours.   Radiology    DG Chest 2 View  Result Date: 12/03/2022 CLINICAL DATA:  Congestive heart failure EXAM: CHEST - 2 VIEW  COMPARISON:  12/02/2022 FINDINGS: Heart size is within normal limits. No pulmonary vascular congestion. Unchanged elevation of right hemidiaphragm. Focal airspace opacity appears increased at the left lung base. IMPRESSION: Interval increase of left basilar airspace opacity may be due to atelectasis or pneumonia. Electronically Signed   By: Acquanetta Belling M.D.   On: 12/03/2022 13:26   DG CHEST PORT 1 VIEW  Result Date: 12/02/2022 CLINICAL DATA:  Congestive heart failure. EXAM: PORTABLE CHEST 1 VIEW COMPARISON:  Chest radiograph dated 11/30/2022. FINDINGS: There is mild eventration of the right hemidiaphragm with right lung base atelectasis. Stable cardiomegaly with improved CHF since the prior radiograph. Small bilateral pleural effusions suspected. No pneumothorax. Median sternotomy wires. No acute osseous pathology. IMPRESSION: Cardiomegaly with improved CHF. Electronically Signed   By: Elgie Collard M.D.   On: 12/02/2022 15:11    Cardiac Studies    Patient Profile     82 y.o. male   with a hx of coronary disease status post CABG in 2002, hypertension, dyslipidemia, obesity and paroxysmal atrial fibrillation with former tobacco abuse who is being seen today for the evaluation of elevated troponin in setting of urosepsis as well as afib with RVR at the request of ED.   Assessment & Plan    #UTI/Urosepsis -pt presents with UTI , profound weakness , no CP  -per TRH -no fevers and normal WBC at this time -TRH getting Abd/pelvic CT  #Elevated Troponin -Troponins were incidentally found to be elevated (([phone] -likely related to demand ischemia and not ACS -2D echo with normal LVF and no focal wall motion abnormalities -had not had any CP>>no further ischemic workup this admission  #CAD  -s/p remote CABG 2002 -Elevated Troponin incidentally found to be elevated [phone] but not c/w ACS and likely related to demand ischemia from urosepsis -denies any CP -echo this admit  with normal LVF -At this point, in the absence of CP and normal LVF with no focal wall motion abnormalities, would pursue conservative therapy for now and  plan on outpt Stress PET CT to rule out ischemia -continue ASA 81mg  daily, Carvedilol 6.25mg  BID, Crestor 40mg  daily and Zetia 10mg  daily -LDL 84 this admit>>Crestor increased to 40mg  daily -will need FLP and ALT in 6 weeks  #Acute hypoxemic respiratory failure #Acute Diastolic CHF -required BIPAP transiently -2D echo with normal LVF -BNP elevated -on Lasix 40mg  IV BID -SCr stable at 1.3 yesterday -Now on Lasix 40mg  IV BID -he put out 2.25L yesterday and is net neg 981cc since admit -weight down 1lb from yesterday and 9lbs from admit -O2 sats 91% on RA -he has crackles at bases that only partially clear with deep cough but improved from yesterday with diuresis -Cxray c/w PNA but WBC is normal and remains afebrile>>TRH has broadened antibx coverage>>suspect mainly CHF given improvement on exam with diuresis -no SGLT2i due to UTI  -check BMET this am -continue Lasix 40mg  IV BID -follow strict I&O's, daily weights and renal function  #PAF/MAT -has hx of PAF in the past in setting of COVID 19 and also has hx of TIA -apparently had been on [facility] in the past but could not afford it -now in afib with CVR -increasing Carvedilol to 25mg  BID due to elevated BP -Pharmacy working application for financial help getting Eliquis  -started on Eliquis 5mg  BID and will give patient 30 day free sample card at discharge  #HTN -increased Carvedilol to 12.5mg  BID yesterday and BP remains elevated at 168/9mmhg -increase Carvedilol to 25mg  BID -continue Amlodipine at 5mg  daily but may have to increase further if BP remains elevated  Total time spent with patient today 35 minutes. This includes reviewing records, discussion with PharmD about financial restrains for anticoagulation, review of 2D echo, evaluating the patient and coordinating care.  Face-to-face time >50%.   For questions or updates, please contact [facility] Please consult [email or web address] for contact info under        Signed, Armanda Magic, MD  12/04/2022, 10:28 AM

## 2022-12-04 NOTE — Telephone Encounter (Addendum)
PAP: Application for ELIQUIS has been submitted to PAP Companies: [employer], via fax If status update is requested, please refer to [phone]

## 2022-12-04 NOTE — Progress Notes (Signed)
Physical Therapy Treatment Patient Details Name: Jeffery Barr MRN: [id number] DOB: 01-14-1941 Today's Date: 12/04/2022   History of Present Illness Pt is an 82 y.o. male presenting 10/11 with back pain and LE weakness. Severe sepsis secondary to UTI . MRI ruled out any signs of infection lower back. Acute hypoxic respiratory failure secondary to acute on chronic congestive heart failure. Concern for NSTEMI. PMH significant for CAD s/p CBG, HTN, HLD, paroxysmal A-fib.    PT Comments  Pt in bed upon arrival with family present. Pt agreeable to PT session. Pt had prior episode of dizziness with staff. Pt had no significant changes in BP in today's session with no complaints of dizziness or lightheadedness. Worked on gait training and LE strengthening in today's session. Pt was able to stand and ambulate ~90 feet with RW and CGA/supervision for safety. Pt needed cues for RW management with transfers and performing turns. Pt continues to progress well towards goals. Acute PT to follow.    If plan is discharge home, recommend the following: Assist for transportation;Assistance with cooking/housework     Equipment Recommendations  None recommended by PT       Precautions / Restrictions Precautions Precautions: Fall Restrictions Weight Bearing Restrictions: No     Mobility  Bed Mobility Overal bed mobility: Modified Independent    General bed mobility comments: ModI for bed mobility, no symptoms of dizziness    Transfers Overall transfer level: Needs assistance Equipment used: Rolling walker (2 wheels), None Transfers: Sit to/from Stand Sit to Stand: Supervision    General transfer comment: Supervision for safety    Ambulation/Gait Ambulation/Gait assistance: Contact guard assist Gait Distance (Feet): 90 Feet Assistive device: Rolling walker (2 wheels), None Gait Pattern/deviations: Step-through pattern, Decreased stride length, Drifts right/left Gait velocity: decr      General Gait Details: steady with RW, cues for RW management with turns         Balance Overall balance assessment: Mild deficits observed, not formally tested         Cognition Arousal: Alert Behavior During Therapy: WFL for tasks assessed/performed Overall Cognitive Status: Impaired/Different from baseline Area of Impairment: Awareness    Problem Solving: Slow processing, Requires verbal cues          Exercises General Exercises - Lower Extremity Long Arc Quad: AROM, Both, 10 reps, Seated (w/ 5 second isometric hold) Hip Flexion/Marching: AROM, 10 reps, Standing, Both (CGA) Other Exercises Other Exercises: 5 serial STS w/ no AD, CGA    General Comments General comments (skin integrity, edema, etc.): VSS on RA, no changes in BP with position changes. No symptoms of dizziness or lightheadedness.      Pertinent Vitals/Pain Pain Assessment Pain Assessment: No/denies pain     PT Goals (current goals can now be found in the care plan section) Progress towards PT goals: Progressing toward goals    Frequency    Min 1X/week       AM-PAC PT "6 Clicks" Mobility   Outcome Measure  Help needed turning from your back to your side while in a flat bed without using bedrails?: None Help needed moving from lying on your back to sitting on the side of a flat bed without using bedrails?: None Help needed moving to and from a bed to a chair (including a wheelchair)?: A Little Help needed standing up from a chair using your arms (e.g., wheelchair or bedside chair)?: A Little Help needed to walk in hospital room?: A Little Help needed climbing 3-5 steps  with a railing? : A Little 6 Click Score: 20    End of Session Equipment Utilized During Treatment: Gait belt Activity Tolerance: Patient tolerated treatment well Patient left: in bed;with call bell/phone within reach;with family/visitor present (pt agreeable to call nursing for mobility) Nurse Communication: Mobility  status PT Visit Diagnosis: Unsteadiness on feet (R26.81);Other abnormalities of gait and mobility (R26.89)     Time: 2595-6387 PT Time Calculation (min) (ACUTE ONLY): 28 min  Charges:    $Gait Training: 8-22 mins $Therapeutic Exercise: 8-22 mins PT General Charges $$ ACUTE PT VISIT: 1 Visit                    [name], PT, DPT Secure Chat Preferred  Rehab Office [phone]   [name] 12/04/2022, 4:54 PM

## 2022-12-04 NOTE — Assessment & Plan Note (Signed)
Continue with ezetimibe and rosuvastatin.  ?

## 2022-12-04 NOTE — Hospital Course (Signed)
Mrs. Meyn was admitted to the hospital with the working diagnosis of sepsis due to urine infection.   Chisum Habenicht Gentz is a 81 y.o. male with hx of CAD with history CABG, A-fib not on anticoagulation, TIA, hypertension, hyperlipidemia presented with weakness and back pain and was diagnosed with severe sepsis presumed to be UTI as well as acute hypoxic respiratory failure secondary to CHF, started on Rocephin, but now has elevated procalcitonin and rhonchi and chest x-ray raising suspicion for possible pneumonia.  Has elevated BNP as well.  Despite of being on Lasix, not significant diuresing.

## 2022-12-04 NOTE — Progress Notes (Signed)
Progress Note   Patient: Jeffery Barr EXB:[id number] DOB: 06-27-1940 DOA: 11/29/2022     4 DOS: the patient was seen and examined on 12/04/2022   Brief hospital course: Mrs. Pinkley was admitted to the hospital with the working diagnosis of sepsis due to urine infection.   Tamon Mcintyre Boquet is a 82 y.o. male with hx of CAD with history CABG, A-fib not on anticoagulation, TIA, hypertension, hyperlipidemia presented with weakness and back pain and was diagnosed with severe sepsis presumed to be UTI as well as acute hypoxic respiratory failure secondary to CHF, started on Rocephin, but now has elevated procalcitonin and rhonchi and chest x-ray raising suspicion for possible pneumonia.  Has elevated BNP as well.  Despite of being on Lasix, not significant diuresing.    Assessment and Plan: * Sepsis secondary to UTI (HCC) Cultures have been no growth. Wbc at 7,4 and he has bee afebrile.   Patient has completed antibiotic therapy. Sepsis has resolved.  Acute on chronic diastolic heart failure (HCC) Echocardiogram with preserved LV systolic function with EF 55 to 60%, mild LVH, RV with mild reduction in systolic function,   Urine output is 2,250 ml Systolic blood pressure 132 to 139 mmHg.  Patient has lost 3 kg since admission.   Plan to continue medical therapy with carvedilol.  Diuresis with furosemide 40 mg IV bid.  To consider adding spironolactone.  No SGTL 2 inh due to urine infection.   Acute coronary syndrome NSTMI has been ruled out.   Atrial fibrillation with RVR (HCC) Continue rate control with carvedilol and anticoagulation with apixaban.   HTN (hypertension) Continue blood pressure control with amlodipine and carvedilol.   Dyslipidemia Continue with ezetimibe and rosuvastatin.   Obesity, class 1 Calculated BMI is 32.   Acute kidney injury superimposed on chronic kidney disease (HCC) CKD stage 3a, hypokalemia.   Renal function has been stable with serum cr at  1.30 with K at 3,5 and serum bicarbonate at 28. Na 144 and Mg 2.1   Continue diuresis with furosemide, Follow up renal functio and electrolytes.         Subjective: Patient with improvement in dyspnea, this am very weak and deconditioned, positive orthostatic symptoms   Physical Exam: Vitals:   12/04/22 0438 12/04/22 0719 12/04/22 0749 12/04/22 1111  BP: (!) 162/81  (!) 168/80 132/69  Pulse: 78  81 73  Resp: 19  19 15   Temp: 97.9 F (36.6 C)  98.5 F (36.9 C) 97.6 F (36.4 C)  TempSrc: Oral  Oral Oral  SpO2: 94%  91% 95%  Weight:  96 kg    Height:       Neurology awake and alert ENT with mild pallor Cardiovascular with S1 and S2 present and regular with no gallops, rubs or murmurs Respiratory with no rales or wheezing, no rhonchi Abdomen with no distention  No lower extremity edema  Data Reviewed:    Family Communication: I spoke with patient's wife at the bedside, we talked in detail about patient's condition, plan of care and prognosis and all questions were addressed.   Disposition: Status is: Inpatient Remains inpatient appropriate because: possible dc home tomorrow   Planned Discharge Destination: Home      Author: Coralie Keens, MD 12/04/2022 4:19 PM  For on call review [email or web address].

## 2022-12-04 NOTE — Assessment & Plan Note (Signed)
Calculated BMI is 32.

## 2022-12-04 NOTE — Assessment & Plan Note (Signed)
Continue rate control with carvedilol and anticoagulation with apixaban.

## 2022-12-04 NOTE — Assessment & Plan Note (Addendum)
Echocardiogram with preserved LV systolic function with EF 55 to 60%, mild LVH, RV with mild reduction in systolic function,   Urine output is 2,250 ml Systolic blood pressure 132 to 139 mmHg.  Patient has lost 3 kg since admission.   Plan to continue medical therapy with carvedilol.  Diuresis with furosemide 40 mg IV bid.  To consider adding spironolactone.  No SGTL 2 inh due to urine infection.   Acute coronary syndrome NSTMI has been ruled out.

## 2022-12-04 NOTE — Assessment & Plan Note (Signed)
CKD stage 3a, hypokalemia.   Volume status has improved, at the time of his discharge his serum cr is 1,46 with K at 3,8 and serum bicarbonate at 27. Plan to follow up renal function as outpatient.   Diuretic therapy with furosemide and spironolactone.

## 2022-12-04 NOTE — Assessment & Plan Note (Signed)
Continue blood pressure control with amlodipine and carvedilol.

## 2022-12-05 ENCOUNTER — Other Ambulatory Visit (HOSPITAL_COMMUNITY): Payer: Self-pay

## 2022-12-05 ENCOUNTER — Other Ambulatory Visit: Payer: Self-pay | Admitting: Home Health

## 2022-12-05 DIAGNOSIS — I4891 Unspecified atrial fibrillation: Secondary | ICD-10-CM | POA: Diagnosis not present

## 2022-12-05 DIAGNOSIS — Z5189 Encounter for other specified aftercare: Secondary | ICD-10-CM

## 2022-12-05 DIAGNOSIS — I10 Essential (primary) hypertension: Secondary | ICD-10-CM | POA: Diagnosis not present

## 2022-12-05 DIAGNOSIS — E785 Hyperlipidemia, unspecified: Secondary | ICD-10-CM

## 2022-12-05 DIAGNOSIS — I5033 Acute on chronic diastolic (congestive) heart failure: Secondary | ICD-10-CM | POA: Diagnosis not present

## 2022-12-05 DIAGNOSIS — A419 Sepsis, unspecified organism: Secondary | ICD-10-CM | POA: Diagnosis not present

## 2022-12-05 LAB — BASIC METABOLIC PANEL
Anion gap: 10 (ref 5–15)
BUN: 32 mg/dL — ABNORMAL HIGH (ref 8–23)
CO2: 27 mmol/L (ref 22–32)
Calcium: 8.5 mg/dL — ABNORMAL LOW (ref 8.9–10.3)
Chloride: 105 mmol/L (ref 98–111)
Creatinine, Ser: 1.46 mg/dL — ABNORMAL HIGH (ref 0.61–1.24)
GFR, Estimated: 48 mL/min — ABNORMAL LOW (ref >60)
Glucose, Bld: 104 mg/dL — ABNORMAL HIGH (ref 70–99)
Potassium: 3.8 mmol/L (ref 3.5–5.1)
Sodium: 142 mmol/L (ref 135–145)

## 2022-12-05 MED ORDER — SPIRONOLACTONE 12.5 MG HALF TABLET
12.5000 mg | ORAL_TABLET | Freq: Every day | ORAL | Status: DC
  Administered 2022-12-05: 12.5 mg via ORAL
  Filled 2022-12-05: qty 1

## 2022-12-05 MED ORDER — SPIRONOLACTONE 25 MG PO TABS: 25.0000 mg | ORAL_TABLET | Freq: Every day | ORAL | Status: DC

## 2022-12-05 MED ORDER — CARVEDILOL 25 MG PO TABS
25.0000 mg | ORAL_TABLET | Freq: Two times a day (BID) | ORAL | 0 refills | Status: DC
Start: 2022-12-05 — End: 2023-01-21
  Filled 2022-12-05: qty 60, 30d supply, fill #0

## 2022-12-05 MED ORDER — FUROSEMIDE 40 MG PO TABS: 40.0000 mg | ORAL_TABLET | Freq: Every day | ORAL | Status: DC

## 2022-12-05 MED ORDER — FUROSEMIDE 40 MG PO TABS
40.0000 mg | ORAL_TABLET | Freq: Every day | ORAL | 0 refills | Status: DC
  Filled 2022-12-05: qty 30, 30d supply, fill #0

## 2022-12-05 MED ORDER — SPIRONOLACTONE 25 MG PO TABS
12.5000 mg | ORAL_TABLET | Freq: Every day | ORAL | 0 refills | Status: DC
  Filled 2022-12-05: qty 15, 30d supply, fill #0

## 2022-12-05 MED ORDER — APIXABAN 5 MG PO TABS
5.0000 mg | ORAL_TABLET | Freq: Two times a day (BID) | ORAL | 0 refills | Status: DC
  Filled 2022-12-05: qty 60, 30d supply, fill #0

## 2022-12-05 NOTE — Plan of Care (Signed)
Discharge instructions discussed with patient.  Patient instructed on home medications, restrictions, and follow up appointments. Belongings gathered and sent with patient.  Patients medications to be picked up at [facility] pharmacy Patient discharged via wheelchair by this writer.

## 2022-12-05 NOTE — Progress Notes (Signed)
BMP ordered on Monday 12/09/22 per Dr Mayford Knife request, to be collected at [facility] office

## 2022-12-05 NOTE — Discharge Summary (Addendum)
Physician Discharge Summary   Patient: Jeffery Barr MRN: [id number] DOB: Nov 09, 1940  Admit date:     11/29/2022  Discharge date: 12/05/22  Discharge Physician: York Ram Naiyana Barbian   PCP: [name], MD   Recommendations at discharge:    Carvedilol dose has been increased to 25 mg po bid.  Diuretic therapy with furosemide and spironolactone.  Follow up renal function in 7 days as outpatient.  Added apixaban for anticoagulation. Follow up with Dr Lenise Arena in 7 to 10 days Follow up with Cardiology as scheduled, possible outpatient stress test, PET CT.   Discharge Diagnoses: Principal Problem:   Sepsis secondary to UTI [facility]) Active Problems:   Acute on chronic diastolic heart failure (HCC)   Atrial fibrillation with RVR (HCC)   HTN (hypertension)   Dyslipidemia   Obesity, class 1   Acute kidney injury superimposed on chronic kidney disease (HCC)  Resolved Problems:   * No resolved hospital problems. [facility] Course: Jeffery Barr was admitted to the hospital with the working diagnosis of sepsis due to urine infection, complicated with heart failure decompensation.   82 y.o. male with hx of CAD with history CABG, A-fib,, TIA, hypertension, hyperlipidemia presented with weakness, back pain and fever. On the day of admission he slid of the bed with no head trauma. He was evaluated at outside ED and transferred to [facility] for possible epidural abscess.  On his initial physical examination he was in respiratory distress and was placed on Bipap. His blood pressure was 111/75, HR 86, RR 26 and 02 saturation 95%, lungs with rales at bases, increased work of breathing, abdomen with no distention and positive lower extremity edema.  Had no focal neurologic deficits.   Na 135, K 3,7 Cl 98, bicarbonate 21, glucose 134, bun 19 cr 1,29  BNP 699 CRP 22. Sed rate 85  Lactic acid 2,0 and 1.0  Wbc 9,8 hgb 14.2 plt 154  Sars covid 19 negative  Urine analysis SG 1,030, protein > 300,  large hgb, negative leukocytes, wbc 11-20 rbc 0-5 and many bacteria.   Chest radiograph hypo inflated, cardiomegaly with bilateral hilar vascular congestion, centrally bilateral interstitial infiltrates, small pleural effusions.   Spine MRI with no evidence of acute infection within the cervical, thoracic or lumbar spine. No epidural abscess.  Chronic bilateral pars defects at L5 with associated 6 mm spondylolisthesis, with resultant mild bilateral L5 foraminal stenosis.   EKG 123 bpm, normal axis, normal intervals, atrial fibrillation rhythm with V6 ST depression and negative T wave lead II, III, AvF.   Patient was placed on antibiotic therapy for urinary tract infection.  Diuresis with furosemide.  Echocardiogram with preserved LV systolic function,    Assessment and Plan: * Sepsis secondary to UTI (HCC) Sepsis present on admission. Severe sepsis with lactic acidosis.  Cultures have been no growth. Wbc at 7,4 and he has bee afebrile.   Patient has completed antibiotic therapy. Sepsis has resolved.  Acute on chronic diastolic heart failure (HCC) Echocardiogram with preserved LV systolic function with EF 55 to 60%, mild LVH, RV with mild reduction in systolic function,   Patient was placed on furosemide for diuresis, negative fluid balance was achieved, with significant improvement in his symptoms.  During his hospitalization he lost 3 Kg.   Plan to continue medical therapy with carvedilol.  Furosemide and spironolactone.  No SGTL 2 inh due to urine infection.   Acute coronary syndrome NSTMI has been ruled out.   Acute hypoxemic respiratory failure due  Physician Discharge Summary   Patient: Jeffery Barr MRN: [id number] DOB: Nov 09, 1940  Admit date:     11/29/2022  Discharge date: 12/05/22  Discharge Physician: York Ram Naiyana Barbian   PCP: [name], MD   Recommendations at discharge:    Carvedilol dose has been increased to 25 mg po bid.  Diuretic therapy with furosemide and spironolactone.  Follow up renal function in 7 days as outpatient.  Added apixaban for anticoagulation. Follow up with Dr Lenise Arena in 7 to 10 days Follow up with Cardiology as scheduled, possible outpatient stress test, PET CT.   Discharge Diagnoses: Principal Problem:   Sepsis secondary to UTI [facility]) Active Problems:   Acute on chronic diastolic heart failure (HCC)   Atrial fibrillation with RVR (HCC)   HTN (hypertension)   Dyslipidemia   Obesity, class 1   Acute kidney injury superimposed on chronic kidney disease (HCC)  Resolved Problems:   * No resolved hospital problems. [facility] Course: Jeffery Barr was admitted to the hospital with the working diagnosis of sepsis due to urine infection, complicated with heart failure decompensation.   82 y.o. male with hx of CAD with history CABG, A-fib,, TIA, hypertension, hyperlipidemia presented with weakness, back pain and fever. On the day of admission he slid of the bed with no head trauma. He was evaluated at outside ED and transferred to [facility] for possible epidural abscess.  On his initial physical examination he was in respiratory distress and was placed on Bipap. His blood pressure was 111/75, HR 86, RR 26 and 02 saturation 95%, lungs with rales at bases, increased work of breathing, abdomen with no distention and positive lower extremity edema.  Had no focal neurologic deficits.   Na 135, K 3,7 Cl 98, bicarbonate 21, glucose 134, bun 19 cr 1,29  BNP 699 CRP 22. Sed rate 85  Lactic acid 2,0 and 1.0  Wbc 9,8 hgb 14.2 plt 154  Sars covid 19 negative  Urine analysis SG 1,030, protein > 300,  large hgb, negative leukocytes, wbc 11-20 rbc 0-5 and many bacteria.   Chest radiograph hypo inflated, cardiomegaly with bilateral hilar vascular congestion, centrally bilateral interstitial infiltrates, small pleural effusions.   Spine MRI with no evidence of acute infection within the cervical, thoracic or lumbar spine. No epidural abscess.  Chronic bilateral pars defects at L5 with associated 6 mm spondylolisthesis, with resultant mild bilateral L5 foraminal stenosis.   EKG 123 bpm, normal axis, normal intervals, atrial fibrillation rhythm with V6 ST depression and negative T wave lead II, III, AvF.   Patient was placed on antibiotic therapy for urinary tract infection.  Diuresis with furosemide.  Echocardiogram with preserved LV systolic function,    Assessment and Plan: * Sepsis secondary to UTI (HCC) Sepsis present on admission. Severe sepsis with lactic acidosis.  Cultures have been no growth. Wbc at 7,4 and he has bee afebrile.   Patient has completed antibiotic therapy. Sepsis has resolved.  Acute on chronic diastolic heart failure (HCC) Echocardiogram with preserved LV systolic function with EF 55 to 60%, mild LVH, RV with mild reduction in systolic function,   Patient was placed on furosemide for diuresis, negative fluid balance was achieved, with significant improvement in his symptoms.  During his hospitalization he lost 3 Kg.   Plan to continue medical therapy with carvedilol.  Furosemide and spironolactone.  No SGTL 2 inh due to urine infection.   Acute coronary syndrome NSTMI has been ruled out.   Acute hypoxemic respiratory failure due  12/05/22 0642  Weight: 96.5 kg 96 kg 95.5 kg   BP (!) 115/59 (BP Location: Left Arm)   Pulse 68   Temp 98.6 F (37 C) (Oral)   Resp 16   Ht 5\' 9"  (1.753 m)   Wt 95.5 kg   SpO2 96%   BMI 31.09 kg/m   Patient is feeling better, back pain has improved, no chest pain or dyspnea, no lower extremity edema, no PND or orthopnea  Neurology awake and alert ENT with mild pallor Cardiovascular with S1 and S2 present. Irregularly irregular with no gallops, rubs or murmurs Respiratory with no rales or wheezing Abdomen with no distention  No lower extremity edema   Condition at discharge: stable  The results of significant diagnostics from this hospitalization (including imaging, microbiology, ancillary and laboratory) are listed below for reference.   Imaging Studies: DG Chest 2 View  Result Date: 12/03/2022 CLINICAL DATA:  Congestive heart failure EXAM: CHEST - 2 VIEW COMPARISON:  12/02/2022 FINDINGS: Heart size is within normal limits. No pulmonary vascular congestion.  Unchanged elevation of right hemidiaphragm. Focal airspace opacity appears increased at the left lung base. IMPRESSION: Interval increase of left basilar airspace opacity may be due to atelectasis or pneumonia. Electronically Signed   By: Acquanetta Belling M.D.   On: 12/03/2022 13:26   DG CHEST PORT 1 VIEW  Result Date: 12/02/2022 CLINICAL DATA:  Congestive heart failure. EXAM: PORTABLE CHEST 1 VIEW COMPARISON:  Chest radiograph dated 11/30/2022. FINDINGS: There is mild eventration of the right hemidiaphragm with right lung base atelectasis. Stable cardiomegaly with improved CHF since the prior radiograph. Small bilateral pleural effusions suspected. No pneumothorax. Median sternotomy wires. No acute osseous pathology. IMPRESSION: Cardiomegaly with improved CHF. Electronically Signed   By: Elgie Collard M.D.   On: 12/02/2022 15:11   ECHOCARDIOGRAM COMPLETE  Result Date: 11/30/2022    ECHOCARDIOGRAM REPORT   Patient Name:   Jeffery Barr Date of Exam: 11/30/2022 Medical Rec #:  [id number]        Height:       69.0 in Accession #:    [id number]       Weight:       220.0 lb Date of Birth:  1940-06-01         BSA:          2.151 m Patient Age:    82 years         BP:           163/75 mmHg Patient Gender: M                HR:           84 bpm. Exam Location:  Inpatient Procedure: 2D Echo, Color Doppler, Cardiac Doppler and Intracardiac            Opacification Agent Indications:    I50.9* Heart failure (unspecified)  History:        Patient has prior history of Echocardiogram examinations, most                 recent 06/01/2021. CAD, Arrythmias:Atrial Fibrillation; Risk                 Factors:Hypertension and Dyslipidemia.  Sonographer:    Irving Burton Senior RDCS Referring Phys: [id number] Dolly Rias  Sonographer Comments: Technically difficult due to poor echo windows. IMPRESSIONS  1. Left ventricular ejection fraction, by estimation, is 55 to 60%. The left ventricle has normal function. Left ventricular  endocardial border  Physician Discharge Summary   Patient: Jeffery Barr MRN: [id number] DOB: Nov 09, 1940  Admit date:     11/29/2022  Discharge date: 12/05/22  Discharge Physician: York Ram Naiyana Barbian   PCP: [name], MD   Recommendations at discharge:    Carvedilol dose has been increased to 25 mg po bid.  Diuretic therapy with furosemide and spironolactone.  Follow up renal function in 7 days as outpatient.  Added apixaban for anticoagulation. Follow up with Dr Lenise Arena in 7 to 10 days Follow up with Cardiology as scheduled, possible outpatient stress test, PET CT.   Discharge Diagnoses: Principal Problem:   Sepsis secondary to UTI [facility]) Active Problems:   Acute on chronic diastolic heart failure (HCC)   Atrial fibrillation with RVR (HCC)   HTN (hypertension)   Dyslipidemia   Obesity, class 1   Acute kidney injury superimposed on chronic kidney disease (HCC)  Resolved Problems:   * No resolved hospital problems. [facility] Course: Jeffery Barr was admitted to the hospital with the working diagnosis of sepsis due to urine infection, complicated with heart failure decompensation.   82 y.o. male with hx of CAD with history CABG, A-fib,, TIA, hypertension, hyperlipidemia presented with weakness, back pain and fever. On the day of admission he slid of the bed with no head trauma. He was evaluated at outside ED and transferred to [facility] for possible epidural abscess.  On his initial physical examination he was in respiratory distress and was placed on Bipap. His blood pressure was 111/75, HR 86, RR 26 and 02 saturation 95%, lungs with rales at bases, increased work of breathing, abdomen with no distention and positive lower extremity edema.  Had no focal neurologic deficits.   Na 135, K 3,7 Cl 98, bicarbonate 21, glucose 134, bun 19 cr 1,29  BNP 699 CRP 22. Sed rate 85  Lactic acid 2,0 and 1.0  Wbc 9,8 hgb 14.2 plt 154  Sars covid 19 negative  Urine analysis SG 1,030, protein > 300,  large hgb, negative leukocytes, wbc 11-20 rbc 0-5 and many bacteria.   Chest radiograph hypo inflated, cardiomegaly with bilateral hilar vascular congestion, centrally bilateral interstitial infiltrates, small pleural effusions.   Spine MRI with no evidence of acute infection within the cervical, thoracic or lumbar spine. No epidural abscess.  Chronic bilateral pars defects at L5 with associated 6 mm spondylolisthesis, with resultant mild bilateral L5 foraminal stenosis.   EKG 123 bpm, normal axis, normal intervals, atrial fibrillation rhythm with V6 ST depression and negative T wave lead II, III, AvF.   Patient was placed on antibiotic therapy for urinary tract infection.  Diuresis with furosemide.  Echocardiogram with preserved LV systolic function,    Assessment and Plan: * Sepsis secondary to UTI (HCC) Sepsis present on admission. Severe sepsis with lactic acidosis.  Cultures have been no growth. Wbc at 7,4 and he has bee afebrile.   Patient has completed antibiotic therapy. Sepsis has resolved.  Acute on chronic diastolic heart failure (HCC) Echocardiogram with preserved LV systolic function with EF 55 to 60%, mild LVH, RV with mild reduction in systolic function,   Patient was placed on furosemide for diuresis, negative fluid balance was achieved, with significant improvement in his symptoms.  During his hospitalization he lost 3 Kg.   Plan to continue medical therapy with carvedilol.  Furosemide and spironolactone.  No SGTL 2 inh due to urine infection.   Acute coronary syndrome NSTMI has been ruled out.   Acute hypoxemic respiratory failure due  Physician Discharge Summary   Patient: Jeffery Barr MRN: [id number] DOB: Nov 09, 1940  Admit date:     11/29/2022  Discharge date: 12/05/22  Discharge Physician: York Ram Naiyana Barbian   PCP: [name], MD   Recommendations at discharge:    Carvedilol dose has been increased to 25 mg po bid.  Diuretic therapy with furosemide and spironolactone.  Follow up renal function in 7 days as outpatient.  Added apixaban for anticoagulation. Follow up with Dr Lenise Arena in 7 to 10 days Follow up with Cardiology as scheduled, possible outpatient stress test, PET CT.   Discharge Diagnoses: Principal Problem:   Sepsis secondary to UTI [facility]) Active Problems:   Acute on chronic diastolic heart failure (HCC)   Atrial fibrillation with RVR (HCC)   HTN (hypertension)   Dyslipidemia   Obesity, class 1   Acute kidney injury superimposed on chronic kidney disease (HCC)  Resolved Problems:   * No resolved hospital problems. [facility] Course: Jeffery Barr was admitted to the hospital with the working diagnosis of sepsis due to urine infection, complicated with heart failure decompensation.   82 y.o. male with hx of CAD with history CABG, A-fib,, TIA, hypertension, hyperlipidemia presented with weakness, back pain and fever. On the day of admission he slid of the bed with no head trauma. He was evaluated at outside ED and transferred to [facility] for possible epidural abscess.  On his initial physical examination he was in respiratory distress and was placed on Bipap. His blood pressure was 111/75, HR 86, RR 26 and 02 saturation 95%, lungs with rales at bases, increased work of breathing, abdomen with no distention and positive lower extremity edema.  Had no focal neurologic deficits.   Na 135, K 3,7 Cl 98, bicarbonate 21, glucose 134, bun 19 cr 1,29  BNP 699 CRP 22. Sed rate 85  Lactic acid 2,0 and 1.0  Wbc 9,8 hgb 14.2 plt 154  Sars covid 19 negative  Urine analysis SG 1,030, protein > 300,  large hgb, negative leukocytes, wbc 11-20 rbc 0-5 and many bacteria.   Chest radiograph hypo inflated, cardiomegaly with bilateral hilar vascular congestion, centrally bilateral interstitial infiltrates, small pleural effusions.   Spine MRI with no evidence of acute infection within the cervical, thoracic or lumbar spine. No epidural abscess.  Chronic bilateral pars defects at L5 with associated 6 mm spondylolisthesis, with resultant mild bilateral L5 foraminal stenosis.   EKG 123 bpm, normal axis, normal intervals, atrial fibrillation rhythm with V6 ST depression and negative T wave lead II, III, AvF.   Patient was placed on antibiotic therapy for urinary tract infection.  Diuresis with furosemide.  Echocardiogram with preserved LV systolic function,    Assessment and Plan: * Sepsis secondary to UTI (HCC) Sepsis present on admission. Severe sepsis with lactic acidosis.  Cultures have been no growth. Wbc at 7,4 and he has bee afebrile.   Patient has completed antibiotic therapy. Sepsis has resolved.  Acute on chronic diastolic heart failure (HCC) Echocardiogram with preserved LV systolic function with EF 55 to 60%, mild LVH, RV with mild reduction in systolic function,   Patient was placed on furosemide for diuresis, negative fluid balance was achieved, with significant improvement in his symptoms.  During his hospitalization he lost 3 Kg.   Plan to continue medical therapy with carvedilol.  Furosemide and spironolactone.  No SGTL 2 inh due to urine infection.   Acute coronary syndrome NSTMI has been ruled out.   Acute hypoxemic respiratory failure due  12/05/22 0642  Weight: 96.5 kg 96 kg 95.5 kg   BP (!) 115/59 (BP Location: Left Arm)   Pulse 68   Temp 98.6 F (37 C) (Oral)   Resp 16   Ht 5\' 9"  (1.753 m)   Wt 95.5 kg   SpO2 96%   BMI 31.09 kg/m   Patient is feeling better, back pain has improved, no chest pain or dyspnea, no lower extremity edema, no PND or orthopnea  Neurology awake and alert ENT with mild pallor Cardiovascular with S1 and S2 present. Irregularly irregular with no gallops, rubs or murmurs Respiratory with no rales or wheezing Abdomen with no distention  No lower extremity edema   Condition at discharge: stable  The results of significant diagnostics from this hospitalization (including imaging, microbiology, ancillary and laboratory) are listed below for reference.   Imaging Studies: DG Chest 2 View  Result Date: 12/03/2022 CLINICAL DATA:  Congestive heart failure EXAM: CHEST - 2 VIEW COMPARISON:  12/02/2022 FINDINGS: Heart size is within normal limits. No pulmonary vascular congestion.  Unchanged elevation of right hemidiaphragm. Focal airspace opacity appears increased at the left lung base. IMPRESSION: Interval increase of left basilar airspace opacity may be due to atelectasis or pneumonia. Electronically Signed   By: Acquanetta Belling M.D.   On: 12/03/2022 13:26   DG CHEST PORT 1 VIEW  Result Date: 12/02/2022 CLINICAL DATA:  Congestive heart failure. EXAM: PORTABLE CHEST 1 VIEW COMPARISON:  Chest radiograph dated 11/30/2022. FINDINGS: There is mild eventration of the right hemidiaphragm with right lung base atelectasis. Stable cardiomegaly with improved CHF since the prior radiograph. Small bilateral pleural effusions suspected. No pneumothorax. Median sternotomy wires. No acute osseous pathology. IMPRESSION: Cardiomegaly with improved CHF. Electronically Signed   By: Elgie Collard M.D.   On: 12/02/2022 15:11   ECHOCARDIOGRAM COMPLETE  Result Date: 11/30/2022    ECHOCARDIOGRAM REPORT   Patient Name:   Jeffery Barr Date of Exam: 11/30/2022 Medical Rec #:  [id number]        Height:       69.0 in Accession #:    [id number]       Weight:       220.0 lb Date of Birth:  1940-06-01         BSA:          2.151 m Patient Age:    82 years         BP:           163/75 mmHg Patient Gender: M                HR:           84 bpm. Exam Location:  Inpatient Procedure: 2D Echo, Color Doppler, Cardiac Doppler and Intracardiac            Opacification Agent Indications:    I50.9* Heart failure (unspecified)  History:        Patient has prior history of Echocardiogram examinations, most                 recent 06/01/2021. CAD, Arrythmias:Atrial Fibrillation; Risk                 Factors:Hypertension and Dyslipidemia.  Sonographer:    Irving Burton Senior RDCS Referring Phys: [id number] Dolly Rias  Sonographer Comments: Technically difficult due to poor echo windows. IMPRESSIONS  1. Left ventricular ejection fraction, by estimation, is 55 to 60%. The left ventricle has normal function. Left ventricular  endocardial border  Physician Discharge Summary   Patient: Jeffery Barr MRN: [id number] DOB: Nov 09, 1940  Admit date:     11/29/2022  Discharge date: 12/05/22  Discharge Physician: York Ram Naiyana Barbian   PCP: [name], MD   Recommendations at discharge:    Carvedilol dose has been increased to 25 mg po bid.  Diuretic therapy with furosemide and spironolactone.  Follow up renal function in 7 days as outpatient.  Added apixaban for anticoagulation. Follow up with Dr Lenise Arena in 7 to 10 days Follow up with Cardiology as scheduled, possible outpatient stress test, PET CT.   Discharge Diagnoses: Principal Problem:   Sepsis secondary to UTI [facility]) Active Problems:   Acute on chronic diastolic heart failure (HCC)   Atrial fibrillation with RVR (HCC)   HTN (hypertension)   Dyslipidemia   Obesity, class 1   Acute kidney injury superimposed on chronic kidney disease (HCC)  Resolved Problems:   * No resolved hospital problems. [facility] Course: Jeffery Barr was admitted to the hospital with the working diagnosis of sepsis due to urine infection, complicated with heart failure decompensation.   82 y.o. male with hx of CAD with history CABG, A-fib,, TIA, hypertension, hyperlipidemia presented with weakness, back pain and fever. On the day of admission he slid of the bed with no head trauma. He was evaluated at outside ED and transferred to [facility] for possible epidural abscess.  On his initial physical examination he was in respiratory distress and was placed on Bipap. His blood pressure was 111/75, HR 86, RR 26 and 02 saturation 95%, lungs with rales at bases, increased work of breathing, abdomen with no distention and positive lower extremity edema.  Had no focal neurologic deficits.   Na 135, K 3,7 Cl 98, bicarbonate 21, glucose 134, bun 19 cr 1,29  BNP 699 CRP 22. Sed rate 85  Lactic acid 2,0 and 1.0  Wbc 9,8 hgb 14.2 plt 154  Sars covid 19 negative  Urine analysis SG 1,030, protein > 300,  large hgb, negative leukocytes, wbc 11-20 rbc 0-5 and many bacteria.   Chest radiograph hypo inflated, cardiomegaly with bilateral hilar vascular congestion, centrally bilateral interstitial infiltrates, small pleural effusions.   Spine MRI with no evidence of acute infection within the cervical, thoracic or lumbar spine. No epidural abscess.  Chronic bilateral pars defects at L5 with associated 6 mm spondylolisthesis, with resultant mild bilateral L5 foraminal stenosis.   EKG 123 bpm, normal axis, normal intervals, atrial fibrillation rhythm with V6 ST depression and negative T wave lead II, III, AvF.   Patient was placed on antibiotic therapy for urinary tract infection.  Diuresis with furosemide.  Echocardiogram with preserved LV systolic function,    Assessment and Plan: * Sepsis secondary to UTI (HCC) Sepsis present on admission. Severe sepsis with lactic acidosis.  Cultures have been no growth. Wbc at 7,4 and he has bee afebrile.   Patient has completed antibiotic therapy. Sepsis has resolved.  Acute on chronic diastolic heart failure (HCC) Echocardiogram with preserved LV systolic function with EF 55 to 60%, mild LVH, RV with mild reduction in systolic function,   Patient was placed on furosemide for diuresis, negative fluid balance was achieved, with significant improvement in his symptoms.  During his hospitalization he lost 3 Kg.   Plan to continue medical therapy with carvedilol.  Furosemide and spironolactone.  No SGTL 2 inh due to urine infection.   Acute coronary syndrome NSTMI has been ruled out.   Acute hypoxemic respiratory failure due  12/05/22 0642  Weight: 96.5 kg 96 kg 95.5 kg   BP (!) 115/59 (BP Location: Left Arm)   Pulse 68   Temp 98.6 F (37 C) (Oral)   Resp 16   Ht 5\' 9"  (1.753 m)   Wt 95.5 kg   SpO2 96%   BMI 31.09 kg/m   Patient is feeling better, back pain has improved, no chest pain or dyspnea, no lower extremity edema, no PND or orthopnea  Neurology awake and alert ENT with mild pallor Cardiovascular with S1 and S2 present. Irregularly irregular with no gallops, rubs or murmurs Respiratory with no rales or wheezing Abdomen with no distention  No lower extremity edema   Condition at discharge: stable  The results of significant diagnostics from this hospitalization (including imaging, microbiology, ancillary and laboratory) are listed below for reference.   Imaging Studies: DG Chest 2 View  Result Date: 12/03/2022 CLINICAL DATA:  Congestive heart failure EXAM: CHEST - 2 VIEW COMPARISON:  12/02/2022 FINDINGS: Heart size is within normal limits. No pulmonary vascular congestion.  Unchanged elevation of right hemidiaphragm. Focal airspace opacity appears increased at the left lung base. IMPRESSION: Interval increase of left basilar airspace opacity may be due to atelectasis or pneumonia. Electronically Signed   By: Acquanetta Belling M.D.   On: 12/03/2022 13:26   DG CHEST PORT 1 VIEW  Result Date: 12/02/2022 CLINICAL DATA:  Congestive heart failure. EXAM: PORTABLE CHEST 1 VIEW COMPARISON:  Chest radiograph dated 11/30/2022. FINDINGS: There is mild eventration of the right hemidiaphragm with right lung base atelectasis. Stable cardiomegaly with improved CHF since the prior radiograph. Small bilateral pleural effusions suspected. No pneumothorax. Median sternotomy wires. No acute osseous pathology. IMPRESSION: Cardiomegaly with improved CHF. Electronically Signed   By: Elgie Collard M.D.   On: 12/02/2022 15:11   ECHOCARDIOGRAM COMPLETE  Result Date: 11/30/2022    ECHOCARDIOGRAM REPORT   Patient Name:   Jeffery Barr Date of Exam: 11/30/2022 Medical Rec #:  [id number]        Height:       69.0 in Accession #:    [id number]       Weight:       220.0 lb Date of Birth:  1940-06-01         BSA:          2.151 m Patient Age:    82 years         BP:           163/75 mmHg Patient Gender: M                HR:           84 bpm. Exam Location:  Inpatient Procedure: 2D Echo, Color Doppler, Cardiac Doppler and Intracardiac            Opacification Agent Indications:    I50.9* Heart failure (unspecified)  History:        Patient has prior history of Echocardiogram examinations, most                 recent 06/01/2021. CAD, Arrythmias:Atrial Fibrillation; Risk                 Factors:Hypertension and Dyslipidemia.  Sonographer:    Irving Burton Senior RDCS Referring Phys: [id number] Dolly Rias  Sonographer Comments: Technically difficult due to poor echo windows. IMPRESSIONS  1. Left ventricular ejection fraction, by estimation, is 55 to 60%. The left ventricle has normal function. Left ventricular  endocardial border  Physician Discharge Summary   Patient: Jeffery Barr MRN: [id number] DOB: Nov 09, 1940  Admit date:     11/29/2022  Discharge date: 12/05/22  Discharge Physician: York Ram Naiyana Barbian   PCP: [name], MD   Recommendations at discharge:    Carvedilol dose has been increased to 25 mg po bid.  Diuretic therapy with furosemide and spironolactone.  Follow up renal function in 7 days as outpatient.  Added apixaban for anticoagulation. Follow up with Dr Lenise Arena in 7 to 10 days Follow up with Cardiology as scheduled, possible outpatient stress test, PET CT.   Discharge Diagnoses: Principal Problem:   Sepsis secondary to UTI [facility]) Active Problems:   Acute on chronic diastolic heart failure (HCC)   Atrial fibrillation with RVR (HCC)   HTN (hypertension)   Dyslipidemia   Obesity, class 1   Acute kidney injury superimposed on chronic kidney disease (HCC)  Resolved Problems:   * No resolved hospital problems. [facility] Course: Jeffery Barr was admitted to the hospital with the working diagnosis of sepsis due to urine infection, complicated with heart failure decompensation.   82 y.o. male with hx of CAD with history CABG, A-fib,, TIA, hypertension, hyperlipidemia presented with weakness, back pain and fever. On the day of admission he slid of the bed with no head trauma. He was evaluated at outside ED and transferred to [facility] for possible epidural abscess.  On his initial physical examination he was in respiratory distress and was placed on Bipap. His blood pressure was 111/75, HR 86, RR 26 and 02 saturation 95%, lungs with rales at bases, increased work of breathing, abdomen with no distention and positive lower extremity edema.  Had no focal neurologic deficits.   Na 135, K 3,7 Cl 98, bicarbonate 21, glucose 134, bun 19 cr 1,29  BNP 699 CRP 22. Sed rate 85  Lactic acid 2,0 and 1.0  Wbc 9,8 hgb 14.2 plt 154  Sars covid 19 negative  Urine analysis SG 1,030, protein > 300,  large hgb, negative leukocytes, wbc 11-20 rbc 0-5 and many bacteria.   Chest radiograph hypo inflated, cardiomegaly with bilateral hilar vascular congestion, centrally bilateral interstitial infiltrates, small pleural effusions.   Spine MRI with no evidence of acute infection within the cervical, thoracic or lumbar spine. No epidural abscess.  Chronic bilateral pars defects at L5 with associated 6 mm spondylolisthesis, with resultant mild bilateral L5 foraminal stenosis.   EKG 123 bpm, normal axis, normal intervals, atrial fibrillation rhythm with V6 ST depression and negative T wave lead II, III, AvF.   Patient was placed on antibiotic therapy for urinary tract infection.  Diuresis with furosemide.  Echocardiogram with preserved LV systolic function,    Assessment and Plan: * Sepsis secondary to UTI (HCC) Sepsis present on admission. Severe sepsis with lactic acidosis.  Cultures have been no growth. Wbc at 7,4 and he has bee afebrile.   Patient has completed antibiotic therapy. Sepsis has resolved.  Acute on chronic diastolic heart failure (HCC) Echocardiogram with preserved LV systolic function with EF 55 to 60%, mild LVH, RV with mild reduction in systolic function,   Patient was placed on furosemide for diuresis, negative fluid balance was achieved, with significant improvement in his symptoms.  During his hospitalization he lost 3 Kg.   Plan to continue medical therapy with carvedilol.  Furosemide and spironolactone.  No SGTL 2 inh due to urine infection.   Acute coronary syndrome NSTMI has been ruled out.   Acute hypoxemic respiratory failure due  12/05/22 0642  Weight: 96.5 kg 96 kg 95.5 kg   BP (!) 115/59 (BP Location: Left Arm)   Pulse 68   Temp 98.6 F (37 C) (Oral)   Resp 16   Ht 5\' 9"  (1.753 m)   Wt 95.5 kg   SpO2 96%   BMI 31.09 kg/m   Patient is feeling better, back pain has improved, no chest pain or dyspnea, no lower extremity edema, no PND or orthopnea  Neurology awake and alert ENT with mild pallor Cardiovascular with S1 and S2 present. Irregularly irregular with no gallops, rubs or murmurs Respiratory with no rales or wheezing Abdomen with no distention  No lower extremity edema   Condition at discharge: stable  The results of significant diagnostics from this hospitalization (including imaging, microbiology, ancillary and laboratory) are listed below for reference.   Imaging Studies: DG Chest 2 View  Result Date: 12/03/2022 CLINICAL DATA:  Congestive heart failure EXAM: CHEST - 2 VIEW COMPARISON:  12/02/2022 FINDINGS: Heart size is within normal limits. No pulmonary vascular congestion.  Unchanged elevation of right hemidiaphragm. Focal airspace opacity appears increased at the left lung base. IMPRESSION: Interval increase of left basilar airspace opacity may be due to atelectasis or pneumonia. Electronically Signed   By: Acquanetta Belling M.D.   On: 12/03/2022 13:26   DG CHEST PORT 1 VIEW  Result Date: 12/02/2022 CLINICAL DATA:  Congestive heart failure. EXAM: PORTABLE CHEST 1 VIEW COMPARISON:  Chest radiograph dated 11/30/2022. FINDINGS: There is mild eventration of the right hemidiaphragm with right lung base atelectasis. Stable cardiomegaly with improved CHF since the prior radiograph. Small bilateral pleural effusions suspected. No pneumothorax. Median sternotomy wires. No acute osseous pathology. IMPRESSION: Cardiomegaly with improved CHF. Electronically Signed   By: Elgie Collard M.D.   On: 12/02/2022 15:11   ECHOCARDIOGRAM COMPLETE  Result Date: 11/30/2022    ECHOCARDIOGRAM REPORT   Patient Name:   Jeffery Barr Date of Exam: 11/30/2022 Medical Rec #:  [id number]        Height:       69.0 in Accession #:    [id number]       Weight:       220.0 lb Date of Birth:  1940-06-01         BSA:          2.151 m Patient Age:    82 years         BP:           163/75 mmHg Patient Gender: M                HR:           84 bpm. Exam Location:  Inpatient Procedure: 2D Echo, Color Doppler, Cardiac Doppler and Intracardiac            Opacification Agent Indications:    I50.9* Heart failure (unspecified)  History:        Patient has prior history of Echocardiogram examinations, most                 recent 06/01/2021. CAD, Arrythmias:Atrial Fibrillation; Risk                 Factors:Hypertension and Dyslipidemia.  Sonographer:    Irving Burton Senior RDCS Referring Phys: [id number] Dolly Rias  Sonographer Comments: Technically difficult due to poor echo windows. IMPRESSIONS  1. Left ventricular ejection fraction, by estimation, is 55 to 60%. The left ventricle has normal function. Left ventricular  endocardial border

## 2022-12-05 NOTE — Progress Notes (Signed)
Rounding Note    Patient Name: Jeffery Barr Date of Encounter: 12/05/2022  [facility] Cardiologist: Gypsy Balsam, MD  Subjective    Admitted with urosepsis and concern for epidural abscess which has been ruled out Noted to be in afib with CVR.    Troponins are + with flat trend [phone])  No CP or SOB  2D echo with normal LVF  BNP elevated at 2000 with crackles on exam.  Cxray c/w PNA so TRH broadened antibx  Feels good with no complaints.  Anxious to go home Inpatient Medications    Scheduled Meds:  amLODipine  5 mg Oral Daily   apixaban  5 mg Oral BID   aspirin EC  81 mg Oral Daily   carvedilol  25 mg Oral BID   ezetimibe  10 mg Oral Daily   furosemide  40 mg Intravenous BID   rosuvastatin  40 mg Oral Daily   sodium chloride flush  3 mL Intravenous Q12H   Continuous Infusions:   PRN Meds: acetaminophen, albuterol   Vital Signs    Vitals:   12/04/22 2319 12/05/22 0348 12/05/22 0642 12/05/22 0837  BP: 133/66 (!) 149/74  (!) 159/60  Pulse: 70 69  74  Resp: 17 20  14   Temp: 98 F (36.7 C) 98.5 F (36.9 C)  98.5 F (36.9 C)  TempSrc: Oral Oral  Oral  SpO2: 95% 96%  95%  Weight:   95.5 kg   Height:        Intake/Output Summary (Last 24 hours) at 12/05/2022 0843 Last data filed at 12/05/2022 2202 Gross per 24 hour  Intake 730 ml  Output 1800 ml  Net -1070 ml      12/05/2022    6:42 AM 12/04/2022    7:19 AM 12/03/2022    5:00 AM  Last 3 Weights  Weight (lbs) 210 lb 8 oz 211 lb 10.3 oz 212 lb 11.9 oz  Weight (kg) 95.482 kg 96 kg 96.5 kg      Telemetry   Atrial fibrillation with controlled ventricular response  ECG     No new EKG to review- Personally Reviewed  Physical Exam   Physical Exam: Blood pressure (!) 159/60, pulse 74, temperature 98.5 F (36.9 C), temperature source Oral, resp. rate 14, height 5\' 9"  (1.753 m), weight 95.5 kg, SpO2 95%.  GEN: Well nourished, well developed in no acute  distress HEENT: Normal NECK: No JVD; No carotid bruits LYMPHATICS: No lymphadenopathy CARDIAC: Irregularly irregular, no murmurs, rubs, gallops RESPIRATORY:  Clear to auscultation without rales, wheezing or rhonchi  ABDOMEN: Soft, non-tender, non-distended MUSCULOSKELETAL:  No edema; No deformity  SKIN: Warm and dry NEUROLOGIC:  Alert and oriented x 3 PSYCHIATRIC:  Normal affect  Labs    High Sensitivity Troponin:   Recent Labs  Lab 11/30/22 0433 11/30/22 0844 11/30/22 1040  TROPONINIHS 2,083* 2,564* 2,041*     Chemistry Recent Labs  Lab 11/29/22 1605 11/30/22 0433 12/01/22 0321 12/03/22 0427 12/04/22 0952 12/05/22 0300  NA 135 136   < > 140 144 142  K 3.7 3.7   < > 3.4* 3.5 3.8  CL 98 105   < > 104 105 105  CO2 21* 20*   < > 26 28 27   GLUCOSE 134* 146*   < > 104* 128* 104*  BUN 19 18   < > 33* 27* 32*  CREATININE 1.29* 1.49*   < > 1.30* 1.30* 1.46*  CALCIUM 8.9 8.2*   < >  8.6* 9.0 8.5*  MG  --  1.9  --   --  2.1  --   PROT 7.3  --   --   --   --   --   ALBUMIN 3.5  --   --   --   --   --   AST 24  --   --   --   --   --   ALT 16  --   --   --   --   --   ALKPHOS 53  --   --   --   --   --   BILITOT 1.1  --   --   --   --   --   GFRNONAA 56* 47*   < > 56* 56* 48*  ANIONGAP 16* 11   < > 10 11 10    < > = values in this interval not displayed.    Lipids  Recent Labs  Lab 11/30/22 0844  CHOL 135  TRIG 65  HDL 38*  LDLCALC 84  CHOLHDL 3.6    Hematology Recent Labs  Lab 12/01/22 0321 12/02/22 0339 12/03/22 0427  WBC 9.3 8.1 7.4  RBC 4.34 4.15* 4.31  HGB 12.3* 12.2* 12.7*  HCT 36.8* 36.4* 37.6*  MCV 84.8 87.7 87.2  MCH 28.3 29.4 29.5  MCHC 33.4 33.5 33.8  RDW 13.6 13.6 13.4  PLT 150 180 211   Thyroid No results for input(s): "TSH", "FREET4" in the last 168 hours.  BNP Recent Labs  Lab 11/29/22 1612 12/03/22 1055  BNP 699.5* 1,901.8*    DDimer No results for input(s): "DDIMER" in the last 168 hours.   Radiology    DG Chest 2  View  Result Date: 12/03/2022 CLINICAL DATA:  Congestive heart failure EXAM: CHEST - 2 VIEW COMPARISON:  12/02/2022 FINDINGS: Heart size is within normal limits. No pulmonary vascular congestion. Unchanged elevation of right hemidiaphragm. Focal airspace opacity appears increased at the left lung base. IMPRESSION: Interval increase of left basilar airspace opacity may be due to atelectasis or pneumonia. Electronically Signed   By: Acquanetta Belling M.D.   On: 12/03/2022 13:26    Cardiac Studies    Patient Profile     82 y.o. male   with a hx of coronary disease status post CABG in 2002, hypertension, dyslipidemia, obesity and paroxysmal atrial fibrillation with former tobacco abuse who is being seen today for the evaluation of elevated troponin in setting of urosepsis as well as afib with RVR at the request of ED.   Assessment & Plan    #UTI/Urosepsis -pt presents with UTI , profound weakness , no CP  -per TRH -no fevers and normal WBC at this time  #Elevated Troponin -Troponins were incidentally found to be elevated [phone] -likely related to demand ischemia and not ACS -2D echo with normal LVF and no focal wall motion abnormalities -had not had any CP>>no further ischemic workup this admission>> recommend outpatient stress PET CT to rule out ischemia>> can be arranged by his primary cardiologist when he sees him back  #CAD  -s/p remote CABG 2002 -Elevated Troponin incidentally found to be elevated [phone] but not c/w ACS and likely related to demand ischemia from urosepsis -denies any CP -echo this admit with normal LVF -At this point, in the absence of CP and normal LVF with no focal wall motion abnormalities, would pursue conservative therapy for now and plan on outpt Stress PET CT to rule out ischemia -Continue  aspirin 81 mg daily, carvedilol 25 mg twice daily, Crestor 40 mg daily and Zetia 10 mg daily -LDL 84 this admit>>Crestor increased to 40mg  daily -will need  FLP and ALT in 6 weeks  #Acute hypoxemic respiratory failure #Acute Diastolic CHF -required BIPAP transiently -2D echo with normal LVF -BNP elevated -Now on Lasix 40mg  IV BID -he put out 1.8 L yesterday and is net -1.25 L since admission -weight down 1lb from yesterday and 10lbs from admit -Serum creatinine is bumped from 1.3->>1.46 and BUN trending upward.  Suspect he is now euvolemic -O2 sats 96% on room air -Cxray c/w PNA but WBC is normal and remains afebrile>>TRH has broadened antibx coverage>>suspect mainly CHF given improvement on exam with diuresis -no SGLT2i due to UTI  -Change IV Lasix to p.o. 40 mg daily -Add spironolactone 12.5 mg daily -Would not restart home dose of chlorthalidone  #PAF/MAT -has hx of PAF in the past in setting of COVID 19 and also has hx of TIA -apparently had been on [facility] in the past but could not afford it -now in afib with CVR -Continue carvedilol 25 mg twice daily and Eliquis 5 mg twice daily -Pharmacy working application for financial help getting Eliquis  -Will need 30 day free sample card at discharge as Eliquis  #HTN -BP improved yesterday but slightly elevated today but has not received his medications yet BP 159/60  -Continue carvedilol 25 mg twice daily and amlodipine 5 mg daily  -Start spironolactone 12.5 mg daily  total time spent with patient today 35 minutes. This includes reviewing records, discussion with PharmD about financial restrains for anticoagulation, review of 2D echo, evaluating the patient and coordinating care. Face-to-face time >50%.  [facility] will sign off.   Medication Recommendations: Amlodipine 5 mg daily, Eliquis 5 mg twice daily, aspirin 81 mg daily, carvedilol 25 mg twice daily, Zetia 10 mg daily, Lasix 40 mg daily, Crestor 40 mg daily, spironolactone 12.5 mg daily Other recommendations (labs, testing, etc):  BMET on Monday Follow up as an outpatient:  Dr. Dewayne Shorter 7-10 days   For questions or updates,  please contact [facility] Please consult [email or web address] for contact info under        Signed, Armanda Magic, MD  12/05/2022, 8:43 AM

## 2022-12-05 NOTE — Progress Notes (Signed)
Message sent to triage staff to arrange appt with Dr Dewayne Shorter in 7-10 days, staff will call patient to arrange. BMP ordered for 12/09/22 Monday, to be obtained at [facility] office, no appt needed. Per Dr Mayford Knife request. See AVS.

## 2022-12-05 NOTE — Plan of Care (Signed)

## 2022-12-05 NOTE — TOC Transition Note (Addendum)
Transition of Care [facility]) - CM/SW Discharge Note   Patient Details  Name: MALAKHI MARKWOOD MRN: [id number] Date of Birth: 10/19/1940  [facility]) CM/SW Contact:  Leone Haven, RN Phone Number: 12/05/2022, 3:25 PM   Clinical Narrative:    Patient is for dc today, he has transport, will be on eliquis.  Patient does not have prescription coverage, the [facility] office has started the patient asst application for eliquis. Wife is at the bedside to transport him home.   Final next level of care: Home/Self Care Barriers to Discharge: No Barriers Identified   Patient Goals and CMS Choice   Choice offered to / list presented to : NA  Discharge Placement                         Discharge Plan and Services Additional resources added to the After Visit Summary for   In-house Referral: NA Discharge Planning Services: CM Consult Post Acute Care Choice: NA          DME Arranged: N/A DME Agency: NA       HH Arranged: NA          Social Determinants of Health (SDOH) Interventions SDOH Screenings   Food Insecurity: No Food Insecurity (11/30/2022)  Housing: Low Risk  (11/30/2022)  Transportation Needs: No Transportation Needs (11/30/2022)  Utilities: Not At Risk (11/30/2022)  Tobacco Use: Medium Risk (11/29/2022)     Readmission Risk Interventions     No data to display

## 2022-12-06 NOTE — Consult Note (Signed)
Value-Based Care Institute  [facility] Inpatient Consult   12/06/2022  Kashus Bethell Ingalsbe 1941-01-11 [id number]  [facility] [[facility]  Accountable Care Organization [ACO] Patient: Medicare ACO REACH  Primary Care Provider: [name], MD, Deboraha Sprang at [facility], this provider is listed for the transition of care follow up appointments    [facility] Liaison screened the patient remotely at [facility].   The patient was screened for 6 day hospitalization with noted medium risk score for unplanned readmission risk hospital admissions in 6 months.  The patient was assessed for potential Community Care Management service needs for post hospital transition for care coordination. Review of patient's electronic medical record reveals patient is home but barriers may include a need for a prescription insurance plan.   Plan:   Referral request for community care coordination:  Will reach out to the [facility] transition team to update on transitional needs and follow up.   Community Care Management/Population Health does not replace or interfere with any arrangements made by the Inpatient Transition of Care team.   For questions contact:   Charlesetta Shanks, RN, BSN, CCM [place]  [facility], [facility] Liaison Direct Dial: [phone] or secure chat Website: [email or web address]

## 2022-12-11 DIAGNOSIS — I4891 Unspecified atrial fibrillation: Secondary | ICD-10-CM | POA: Diagnosis not present

## 2022-12-11 DIAGNOSIS — I10 Essential (primary) hypertension: Secondary | ICD-10-CM | POA: Diagnosis not present

## 2022-12-11 DIAGNOSIS — I509 Heart failure, unspecified: Secondary | ICD-10-CM | POA: Diagnosis not present

## 2022-12-11 DIAGNOSIS — N390 Urinary tract infection, site not specified: Secondary | ICD-10-CM | POA: Diagnosis not present

## 2022-12-11 DIAGNOSIS — D6869 Other thrombophilia: Secondary | ICD-10-CM | POA: Diagnosis not present

## 2022-12-12 ENCOUNTER — Encounter: Payer: Self-pay | Admitting: Cardiology

## 2022-12-13 ENCOUNTER — Ambulatory Visit: Payer: Medicare Other | Attending: Cardiology | Admitting: Cardiology

## 2022-12-13 ENCOUNTER — Encounter: Payer: Self-pay | Admitting: Cardiology

## 2022-12-13 VITALS — BP 130/64 | HR 52 | Ht 69.0 in | Wt 214.0 lb

## 2022-12-13 DIAGNOSIS — I5022 Chronic systolic (congestive) heart failure: Secondary | ICD-10-CM

## 2022-12-13 DIAGNOSIS — E7800 Pure hypercholesterolemia, unspecified: Secondary | ICD-10-CM

## 2022-12-13 DIAGNOSIS — I4891 Unspecified atrial fibrillation: Secondary | ICD-10-CM | POA: Diagnosis not present

## 2022-12-13 DIAGNOSIS — I10 Essential (primary) hypertension: Secondary | ICD-10-CM

## 2022-12-13 MED ORDER — APIXABAN 5 MG PO TABS: 5.0000 mg | ORAL_TABLET | Freq: Two times a day (BID) | ORAL | 0 refills | Status: DC

## 2022-12-13 NOTE — Patient Instructions (Addendum)
Medication Instructions:  Your physician has recommended you make the following change in your medication: Start Eliquis 5mg  1 tablet 2 twice daily Lab Work: Your physician recommends that you return for lab work in: BMP and Pro BNP If you have labs (blood work) drawn today and your tests are completely normal, you will receive your results only by: MyChart Message (if you have MyChart) OR A paper copy in the mail If you have any lab test that is abnormal or we need to change your treatment, we will call you to review the results.   Testing/Procedures: None   Follow-Up: At [facility], you and your health needs are our priority.  As part of our continuing mission to provide you with exceptional heart care, we have created designated Provider Care Teams.  These Care Teams include your primary Cardiologist (physician) and Advanced Practice Providers (APPs -  Physician Assistants and Nurse Practitioners) who all work together to provide you with the care you need, when you need it.  We recommend signing up for the patient portal called "MyChart".  Sign up information is provided on this After Visit Summary.  MyChart is used to connect with patients for Virtual Visits (Telemedicine).  Patients are able to view lab/test results, encounter notes, upcoming appointments, etc.  Non-urgent messages can be sent to your provider as well.   To learn more about what you can do with MyChart, go to [email or web address].    Your next appointment:   2 month(s)  Provider:   Gypsy Balsam, MD    Other Instructions

## 2022-12-13 NOTE — Progress Notes (Unsigned)
Cardiology Office Note:    Date:  12/13/2022   ID:  Jeffery Barr, DOB [birth year], MRN [id number]  PCP:  [name], MD  Cardiologist:  Gypsy Balsam, MD    Referring MD: [name], MD   Chief Complaint  Patient presents with   Follow-up    History of Present Illness:    Jeffery Barr is a 82 y.o. male with past medical history significant for coronary artery disease, status post coronary bypass graft long time ago, dyslipidemia, essential hypertension, obesity, paroxysmal atrial fibrillation, remains refusing to take anticoagulation now agree.  Recently ended up being in the hospital because of urosepsis.  Treated aggressively for appropriately improved quite significantly, hospital course was complicated by episode of congestive heart that required aggressive diuresis.  Comes today to my office doing well.  Denies of any chest pain tightness squeezing pressure burning chest.  Gradually started feeling better getting stronger.  Past Medical History:  Diagnosis Date   Acute hypoxic respiratory failure (HCC) 11/30/2022   Acute kidney injury superimposed on chronic kidney disease (HCC) 12/04/2022   Acute on chronic diastolic heart failure (HCC) 11/30/2022   Acute respiratory failure with hypoxia (HCC) 07/22/2021   Atrial fibrillation with RVR (HCC) 11/30/2022   Benign essential hypertension 08/27/2021   Benign hypertensive heart disease without heart failure    CAD (coronary artery disease) 11/26/2012   Chronic systolic CHF (congestive heart failure) (HCC) 07/22/2021   Coronary atherosclerosis of native coronary artery    S/P CABG   Dyslipidemia    Generalized weakness 07/22/2021   HTN (hypertension)    Hypoxic respiratory failure (HCC) 11/30/2022   NSTEMI (non-ST elevated myocardial infarction) (HCC) 11/30/2022   Obesity    Obesity (BMI 30-39.9) 11/26/2012   Obesity, class 1 12/04/2022   Other thrombophilia (HCC) 08/27/2021   Paroxysmal atrial fibrillation  (HCC) 07/22/2021   Persistent atrial fibrillation (HCC) 08/27/2021   Pneumonia due to COVID-19 virus 07/21/2021   Pure hypercholesterolemia 08/27/2021   Sepsis secondary to UTI (HCC) 11/30/2022   Severe sepsis (HCC) 11/30/2022   Sinus bradycardia    a. baseline HR 50s.   TIA (transient ischemic attack) 05/30/2021   Transaminitis 07/22/2021    Past Surgical History:  Procedure Laterality Date   CORONARY ARTERY BYPASS GRAFT     VASECTOMY      Current Medications: Current Meds  Medication Sig   amLODipine (NORVASC) 5 MG tablet Take 1 tablet (5 mg total) by mouth daily.   apixaban (ELIQUIS) 5 MG TABS tablet Take 1 tablet (5 mg total) by mouth 2 (two) times daily.   aspirin EC 81 MG tablet Take 81 mg by mouth daily. Swallow whole.   carvedilol (COREG) 25 MG tablet Take 1 tablet (25 mg total) by mouth 2 (two) times daily.   ezetimibe (ZETIA) 10 MG tablet Take 1 tablet (10 mg total) by mouth daily.   furosemide (LASIX) 40 MG tablet Take 1 tablet (40 mg total) by mouth daily.   rosuvastatin (CRESTOR) 20 MG tablet Take 1 tablet (20 mg total) by mouth daily.   spironolactone (ALDACTONE) 25 MG tablet Take 0.5 tablets (12.5 mg total) by mouth daily.     Allergies:   Patient has no known allergies.   Social History   Socioeconomic History   Marital status: Married    Spouse name: Not on file   Number of children: Not on file   Years of education: Not on file   Highest education level: Not on file  Occupational  History   Not on file  Tobacco Use   Smoking status: Former   Smokeless tobacco: Never  Vaping Use   Vaping status: Never Used  Substance and Sexual Activity   Alcohol use: Yes    Alcohol/week: 3.0 - 5.0 standard drinks of alcohol    Types: 1 - 2 Glasses of wine, 1 - 2 Cans of beer, 1 Shots of liquor per week    Comment: 1-2 glasses of wine and beer weekly   Drug use: No   Sexual activity: Not on file  Other Topics Concern   Not on file  Social History Narrative    Right handed   Lives with wife,   One story home,Retired.   Social Determinants of Health   Financial Resource Strain: Not on file  Food Insecurity: No Food Insecurity (11/30/2022)   Hunger Vital Sign    Worried About Running Out of Food in the Last Year: Never true    Ran Out of Food in the Last Year: Never true  Transportation Needs: No Transportation Needs (11/30/2022)   PRAPARE - Administrator, Civil Service (Medical): No    Lack of Transportation (Non-Medical): No  Physical Activity: Not on file  Stress: Not on file  Social Connections: Not on file     Family History: The patient's family history includes CAD in his mother, sister, and another family member; Heart attack in an other family member; Heart disease in his brother; Heart failure in his mother. ROS:   Please see the history of present illness.    All 14 point review of systems negative except as described per history of present illness  EKGs/Labs/Other Studies Reviewed:    EKG Interpretation Date/Time:  Friday December 13 2022 13:40:42 EDT Ventricular Rate:  52 PR Interval:  158 QRS Duration:  94 QT Interval:  482 QTC Calculation: 448 R Axis:   70  Text Interpretation: Sinus bradycardia with Premature supraventricular complexes Low voltage QRS Nonspecific ST abnormality Abnormal ECG When compared with ECG of 30-Nov-2022 05:41, PREVIOUS ECG IS PRESENT Confirmed by Gypsy Balsam [phone]) on 12/13/2022 1:45:09 PM    Recent Labs: 11/29/2022: ALT 16 12/03/2022: B Natriuretic Peptide 1,901.8; Hemoglobin 12.7; Platelets 211 12/04/2022: Magnesium 2.1 12/05/2022: BUN 32; Creatinine, Ser 1.46; Potassium 3.8; Sodium 142  Recent Lipid Panel    Component Value Date/Time   CHOL 135 11/30/2022 0844   CHOL 117 12/21/2019 1006   TRIG 65 11/30/2022 0844   HDL 38 (L) 11/30/2022 0844   HDL 44 12/21/2019 1006   CHOLHDL 3.6 11/30/2022 0844   VLDL 13 11/30/2022 0844   LDLCALC 84 11/30/2022 0844   LDLCALC  55 12/21/2019 1006    Physical Exam:    VS:  BP 130/64 (BP Location: Left Arm, Patient Position: Sitting)   Pulse (!) 52   Ht 5\' 9"  (1.753 m)   Wt 214 lb (97.1 kg)   SpO2 96%   BMI 31.60 kg/m     Wt Readings from Last 3 Encounters:  12/13/22 214 lb (97.1 kg)  12/05/22 210 lb 8 oz (95.5 kg)  11/26/22 220 lb (99.8 kg)     GEN:  Well nourished, well developed in no acute distress HEENT: Normal NECK: No JVD; No carotid bruits LYMPHATICS: No lymphadenopathy CARDIAC: RRR, no murmurs, no rubs, no gallops RESPIRATORY:  Clear to auscultation without rales, wheezing or rhonchi  ABDOMEN: Soft, non-tender, non-distended MUSCULOSKELETAL:  No edema; No deformity  SKIN: Warm and dry LOWER EXTREMITIES: no swelling  NEUROLOGIC:  Alert and oriented x 3 PSYCHIATRIC:  Normal affect   ASSESSMENT:    1. Primary hypertension   2. Chronic systolic CHF (congestive heart failure) (HCC)   3. Benign essential hypertension   4. Atrial fibrillation with RVR (HCC)   5. Pure hypercholesterolemia    PLAN:    In order of problems listed above:  Essential hypertension: Blood pressure well-controlled continue present management. Chronic diastolic congestive heart failure: Seems to be compensated on physical exam will get proBNP and Chem-7 today.  In the meantime continue Aldactone and Lasix. Paroxysmal atrial fibrillation he is in sinus bradycardia today we will continue present management he still complain about prices of Eliquis, try to give him samples as much as I can.  I will see him back in my office in about 2 months. History of urosepsis seems to be doing well recovering   Medication Adjustments/Labs and Tests Ordered: Current medicines are reviewed at length with the patient today.  Concerns regarding medicines are outlined above.  Orders Placed This Encounter  Procedures   EKG 12-Lead   Medication changes: No orders of the defined types were placed in this encounter.   Signed, Georgeanna Lea, MD, [facility] 12/13/2022 2:11 PM    [facility]

## 2022-12-14 LAB — BASIC METABOLIC PANEL
BUN/Creatinine Ratio: 16 (ref 10–24)
BUN: 22 mg/dL (ref 8–27)
CO2: 25 mmol/L (ref 20–29)
Calcium: 10 mg/dL (ref 8.6–10.2)
Chloride: 105 mmol/L (ref 96–106)
Creatinine, Ser: 1.39 mg/dL — ABNORMAL HIGH (ref 0.76–1.27)
Glucose: 90 mg/dL (ref 70–99)
Potassium: 5.7 mmol/L — ABNORMAL HIGH (ref 3.5–5.2)
Sodium: 143 mmol/L (ref 134–144)
eGFR: 51 mL/min/{1.73_m2} — ABNORMAL LOW (ref >59)

## 2022-12-14 LAB — PRO B NATRIURETIC PEPTIDE: NT-Pro BNP: 611 pg/mL — ABNORMAL HIGH (ref 0–486)

## 2022-12-17 ENCOUNTER — Telehealth: Payer: Self-pay

## 2022-12-17 DIAGNOSIS — I10 Essential (primary) hypertension: Secondary | ICD-10-CM

## 2022-12-17 NOTE — Telephone Encounter (Signed)
Spoke with Nina(wife), notified of results and recommendations. Aware of lab hours and location in [place]

## 2022-12-17 NOTE — Telephone Encounter (Signed)
-----   Message from Gypsy Balsam sent at 12/17/2022  9:11 AM EDT ----- Blood test showing high potassium.  Please stop spironolactone, Chem-7 1 week

## 2022-12-23 NOTE — Therapy (Signed)
OUTPATIENT PHYSICAL THERAPY THORACOLUMBAR EVALUATION   Patient Name: Jeffery Barr MRN: [id number] DOB:October 16, 1940, 82 y.o., male Today's Date: 12/25/2022  END OF SESSION:  PT End of Session - 12/25/22 1154     Visit Number 1    Date for PT Re-Evaluation 03/05/23    Authorization Type Medicare + AARP supplement    Progress Note Due on Visit 10    PT Start Time 1150    PT Stop Time 1234    PT Time Calculation (min) 44 min    Activity Tolerance Patient tolerated treatment well    Behavior During Therapy [facility] for tasks assessed/performed             Past Medical History:  Diagnosis Date   Acute hypoxic respiratory failure (HCC) 11/30/2022   Acute kidney injury superimposed on chronic kidney disease (HCC) 12/04/2022   Acute on chronic diastolic heart failure (HCC) 11/30/2022   Acute respiratory failure with hypoxia (HCC) 07/22/2021   Atrial fibrillation with RVR (HCC) 11/30/2022   Benign essential hypertension 08/27/2021   Benign hypertensive heart disease without heart failure    CAD (coronary artery disease) 11/26/2012   Chronic systolic CHF (congestive heart failure) (HCC) 07/22/2021   Coronary atherosclerosis of native coronary artery    S/P CABG   Dyslipidemia    Generalized weakness 07/22/2021   HTN (hypertension)    Hypoxic respiratory failure (HCC) 11/30/2022   NSTEMI (non-ST elevated myocardial infarction) (HCC) 11/30/2022   Obesity    Obesity (BMI 30-39.9) 11/26/2012   Obesity, class 1 12/04/2022   Other thrombophilia (HCC) 08/27/2021   Paroxysmal atrial fibrillation (HCC) 07/22/2021   Persistent atrial fibrillation (HCC) 08/27/2021   Pneumonia due to COVID-19 virus 07/21/2021   Pure hypercholesterolemia 08/27/2021   Sepsis secondary to UTI (HCC) 11/30/2022   Severe sepsis (HCC) 11/30/2022   Sinus bradycardia    a. baseline HR 50s.   TIA (transient ischemic attack) 05/30/2021   Transaminitis 07/22/2021   Past Surgical History:  Procedure Laterality  Date   CORONARY ARTERY BYPASS GRAFT     VASECTOMY     Patient Active Problem List   Diagnosis Date Noted   Obesity, class 1 12/04/2022   Acute kidney injury superimposed on chronic kidney disease (HCC) 12/04/2022   Sepsis secondary to UTI (HCC) 11/30/2022   NSTEMI (non-ST elevated myocardial infarction) (HCC) 11/30/2022   Severe sepsis (HCC) 11/30/2022   Hypoxic respiratory failure (HCC) 11/30/2022   Acute on chronic diastolic heart failure (HCC) 11/30/2022   Atrial fibrillation with RVR (HCC) 11/30/2022   Acute hypoxic respiratory failure (HCC) 11/30/2022   Persistent atrial fibrillation (HCC) 08/27/2021   Benign essential hypertension 08/27/2021   Other thrombophilia (HCC) 08/27/2021   Pure hypercholesterolemia 08/27/2021   Acute respiratory failure with hypoxia (HCC) 07/22/2021   Transaminitis 07/22/2021   Generalized weakness 07/22/2021   Paroxysmal atrial fibrillation (HCC) 07/22/2021   Chronic systolic CHF (congestive heart failure) (HCC) 07/22/2021   Pneumonia due to COVID-19 virus 07/21/2021   TIA (transient ischemic attack) 05/30/2021   CAD (coronary artery disease) 11/26/2012   HTN (hypertension) 11/26/2012   Obesity (BMI 30-39.9) 11/26/2012   Dyslipidemia 11/26/2012    PCP: [name], MD   REFERRING PROVIDER: Hughie Closs, MD   REFERRING DIAG: Unsteadiness on feet (R26.81);Other abnormalities of gait and mobility (R26.89);Pain Pain - part of body:  (back)   Rationale for Evaluation and Treatment: Rehabilitation  THERAPY DIAG:  Other low back pain  Other abnormalities of gait and mobility  Muscle weakness (generalized)  ONSET DATE: ED to hospital admission 11/29/2022, DC on 12/05/2022  SUBJECTIVE:                                                                                                                                                                                           SUBJECTIVE STATEMENT: Patient wife accompanied him today, reported  most of his concerns.  He is till walking bent forward and shuffling feet.  Can't walk too far before complaining about his back so walks have gotten really short. Also having trouble keeping up with his wife. This started before the hospitalization.  He's not sure if the pain is coming from his back or hip, no pain now sitting here, it comes and goes, worse in the morning, gets better after moving around.     PERTINENT HISTORY:  Sepsis secondary to UTI, Acute on chronic diastolic heart failure, Afib with RVR, HTN, dylipidemia, Acute kidney injury on CKD, history of CAD with CABG, TIA, h/o R knee arthroscopic surgery  PAIN:  Are you having pain? Yes: NPRS scale: 0-7/10 Pain location: L side low back/glute Pain description: dull Aggravating factors: walking distances, mornings it hurts Relieving factors: goes away after moving around a little  PRECAUTIONS: Fall  RED FLAGS: None   WEIGHT BEARING RESTRICTIONS: No  FALLS:  Has patient fallen in last 6 months? Yes. Number of falls 1 - at hospital  LIVING ENVIRONMENT: Lives with: lives with their spouse Lives in: House/apartment Stairs: No Has following equipment at home: None  OCCUPATION: retired  PLOF: Independent and Leisure: daily walks around the block ~ 15 min   PATIENT GOALS: get better  NEXT MD VISIT: had follow-up visit last week  OBJECTIVE:   DIAGNOSTIC FINDINGS:  11/29/2022 MRI Lumbar spine IMPRESSION: 1. Motion degraded exam. Additionally, Please note that this is a limited sagittal only MRI for screening of possible epidural abscess or other spinal infection. No axial images were performed. 2. No MRI evidence for acute infection within the cervical, thoracic, or lumbar spine. No epidural abscess. 3. Chronic bilateral pars defects at L5 with associated 6 mm spondylolisthesis, with resultant mild bilateral L5 foraminal stenosis. 4. Otherwise, no other significant disc pathology within the cervical, thoracic, or  lumbar spine for age. No other stenosis or impingement.  PATIENT SURVEYS:  ABC scale 1100/1600; 68.8% Modified Oswestry 17/50   COGNITION: Overall cognitive status: Within functional limits for tasks assessed     SENSATION: WFL  MUSCLE LENGTH: Hamstrings: moderate tightness bil Thomas test: moderate tightness bil  POSTURE: rounded shoulders, forward head, and decreased lumbar lordosis  PALPATION: Tenderness to PA mobs L2-4, L QL  LUMBAR  ROM:   AROM eval  Flexion  To mid shin, pull L side low back  Extension WNL  Right lateral flexion To knee, pull L side  Left lateral flexion To knee  Right rotation WNL, pull L side   Left rotation WNL   (Blank rows = not tested)  LOWER EXTREMITY ROM:   Decreased L hip IR (20 deg) compared to R hip (30 deg), postive FABER on L (groin pain) but negative scour test.    LOWER EXTREMITY MMT:    MMT Right eval Left eval  Hip flexion 4 4+  Hip extension 3+ 3+  Hip abduction 5 5  Hip adduction 5 5  Knee flexion 4+ 4+  Knee extension 5 5  Ankle dorsiflexion 5 5  Ankle plantarflexion 5 5   (Blank rows = not tested)  LUMBAR SPECIAL TESTS:  Straight leg raise test: Negative  FUNCTIONAL TESTS:  5 times sit to stand: 10.66 seconds without UE assist.  Timed up and go (TUG): 9.76 sec Dynamic Gait Index: 19/24 MCTSIB: Condition 1: Avg of 3 trials: 30 sec, Condition 2: Avg of 3 trials: 30 sec, Condition 3: Avg of 3 trials: 30 sec, Condition 4: Avg of 3 trials: 30 sec, and Total Score: 120/120 Gait speed 0.69 m/s Tandem L foot 30 , R foot 13 sec   GAIT: Distance walked: 2' Assistive device utilized: None Level of assistance: Complete Independence Comments: wide BOS, decreased heel strike on L, occasional shuffling, forward lean, decreased gait speed,   TODAY'S TREATMENT:                                                                                                                              DATE:   12/25/22 EVAL only      PATIENT EDUCATION:  Education details: findings, POC Person educated: Patient Education method: Explanation Education comprehension: verbalized understanding  HOME EXERCISE PROGRAM: TBD  ASSESSMENT:  CLINICAL IMPRESSION: TYMIER LINDHOLM  is an 82 y.o. male who was seen today for physical therapy evaluation and treatment for LBP plus gait and balance issues following hospitalization 11/29/22-12/05/22 for sepsis.  Reports L sided low back pain worse in morning, improves with gentle movement but aggravated with prolonged standing/walking, radiates into L glute but does not extend down leg, no changes in bowel or bladder control.  He has also been having difficulty prior to hospitalization with gait and balance, wife reports he has been walking slower and with wider BOS.  He had one fall in hospital.   Patient presents with low back pain, impaired activity tolerance, impaired standing balance, impaired ambulation, and decreased safety awareness impacting safe and independent functional mobility. Examination also reveals patient is at risk for falls and functional decline as evidenced by the following objective test measures: Gait speed 0.69 m/sec, (44m/sec is needed for community access), DGI 19/24 (19-22 /24 indicates moderate fall risk in community dwelling adults), and tandem stand 30 sec R, 13 sec  L (<20 sec high risk for falls). Patient will benefit from skilled physical therapy services to help reach the maximal level of functional independence and mobility and decrease pain. Patient demonstrates understanding of this plan of care and is in agreement with this plan.    OBJECTIVE IMPAIRMENTS: Abnormal gait, decreased activity tolerance, decreased balance, decreased endurance, decreased mobility, difficulty walking, decreased ROM, decreased strength, hypomobility, increased muscle spasms, and pain.   ACTIVITY LIMITATIONS: carrying, lifting, bending, sitting, standing, squatting, sleeping,  and locomotion level  PARTICIPATION LIMITATIONS: meal prep, cleaning, laundry, driving, shopping, and yard work  PERSONAL FACTORS: Age, Past/current experiences, Time since onset of injury/illness/exacerbation, and 3+ comorbidities: Sepsis secondary to UTI, Acute on chronic diastolic heart failure, Afib with RVR, HTN, dylipidemia, Acute kidney injury on CKD, history of CAD with CABG, TIA, h/o R knee arthroscopic surgery  are also affecting patient's functional outcome.   REHAB POTENTIAL: Good  CLINICAL DECISION MAKING: Evolving/moderate complexity  EVALUATION COMPLEXITY: Moderate   GOALS: Goals reviewed with patient? Yes  SHORT TERM GOALS: Target date: 01/15/2023   Patient will be independent with initial HEP.  Baseline: needs Goal status: INITIAL   LONG TERM GOALS: Target date: 03/05/2023   Patient will be independent with advanced/ongoing HEP to improve outcomes and carryover.  Baseline:  Goal status: INITIAL  2.  Patient will report 75% improvement in low back pain to improve QOL.  Baseline:  Goal status: INITIAL  3.  Patient will demonstrate full pain free lumbar ROM to perform ADLs.   Baseline: see objective  Goal status: INITIAL  4.  Patient will demonstrate improved functional strength as demonstrated by 4+/5 hip strength. Baseline: see objective Goal status: INITIAL  5.  Patient will report at least 6 points improvement on modified Oswestry to demonstrate improved functional ability.  Baseline: 17/50 Goal status: INITIAL   6.  Patient will demonstrate improved gait speed to 1.0 m/s for community navigation. Baseline: 0.69 m/s Goal status: INITIAL  7.   Patient be able to maintain tandem stance > 35 sec each side to decrease risk of falls.   Baseline: : L foot forward 30 sec, R foot forward 13 sec, norm for age 37.49 seconds.  Goal status: INITIAL  8. Patient will score >22/24 on DGI to decrease risk of falls.  Baseline: 19/24 Goal status:  INITIAL   PLAN:  PT FREQUENCY: 1-2x/week  PT DURATION: 10 weeks  PLANNED INTERVENTIONS: 97164- PT Re-evaluation, 97110-Therapeutic exercises, 97530- Therapeutic activity, 97112- Neuromuscular re-education, 97535- Self Care, 30865- Manual therapy, [id number]- Gait training, [phone]- Orthotic Fit/training, 97014- Electrical stimulation (unattended), [phone]- Electrical stimulation (manual), [id number]- Ultrasound, 84132- Traction (mechanical), Patient/Family education, Balance training, Stair training, Taping, Dry Needling, Joint mobilization, Joint manipulation, Spinal manipulation, Spinal mobilization, Cryotherapy, and Moist heat.  PLAN FOR NEXT SESSION: HEP for hip/core strengthening, manual therapy, modalities PRN.     Jena Gauss, PT 12/25/2022, 1:47 PM

## 2022-12-23 NOTE — Therapy (Incomplete)
OUTPATIENT PHYSICAL THERAPY THORACOLUMBAR EVALUATION   Patient Name: Jeffery Barr MRN: [id number] DOB:04/27/1940, 82 y.o., male Today's Date: 12/23/2022  END OF SESSION:   Past Medical History:  Diagnosis Date   Acute hypoxic respiratory failure (HCC) 11/30/2022   Acute kidney injury superimposed on chronic kidney disease (HCC) 12/04/2022   Acute on chronic diastolic heart failure (HCC) 11/30/2022   Acute respiratory failure with hypoxia (HCC) 07/22/2021   Atrial fibrillation with RVR (HCC) 11/30/2022   Benign essential hypertension 08/27/2021   Benign hypertensive heart disease without heart failure    CAD (coronary artery disease) 11/26/2012   Chronic systolic CHF (congestive heart failure) (HCC) 07/22/2021   Coronary atherosclerosis of native coronary artery    S/P CABG   Dyslipidemia    Generalized weakness 07/22/2021   HTN (hypertension)    Hypoxic respiratory failure (HCC) 11/30/2022   NSTEMI (non-ST elevated myocardial infarction) (HCC) 11/30/2022   Obesity    Obesity (BMI 30-39.9) 11/26/2012   Obesity, class 1 12/04/2022   Other thrombophilia (HCC) 08/27/2021   Paroxysmal atrial fibrillation (HCC) 07/22/2021   Persistent atrial fibrillation (HCC) 08/27/2021   Pneumonia due to COVID-19 virus 07/21/2021   Pure hypercholesterolemia 08/27/2021   Sepsis secondary to UTI (HCC) 11/30/2022   Severe sepsis (HCC) 11/30/2022   Sinus bradycardia    a. baseline HR 50s.   TIA (transient ischemic attack) 05/30/2021   Transaminitis 07/22/2021   Past Surgical History:  Procedure Laterality Date   CORONARY ARTERY BYPASS GRAFT     VASECTOMY     Patient Active Problem List   Diagnosis Date Noted   Obesity, class 1 12/04/2022   Acute kidney injury superimposed on chronic kidney disease (HCC) 12/04/2022   Sepsis secondary to UTI (HCC) 11/30/2022   NSTEMI (non-ST elevated myocardial infarction) (HCC) 11/30/2022   Severe sepsis (HCC) 11/30/2022   Hypoxic respiratory failure  (HCC) 11/30/2022   Acute on chronic diastolic heart failure (HCC) 11/30/2022   Atrial fibrillation with RVR (HCC) 11/30/2022   Acute hypoxic respiratory failure (HCC) 11/30/2022   Persistent atrial fibrillation (HCC) 08/27/2021   Benign essential hypertension 08/27/2021   Other thrombophilia (HCC) 08/27/2021   Pure hypercholesterolemia 08/27/2021   Acute respiratory failure with hypoxia (HCC) 07/22/2021   Transaminitis 07/22/2021   Generalized weakness 07/22/2021   Paroxysmal atrial fibrillation (HCC) 07/22/2021   Chronic systolic CHF (congestive heart failure) (HCC) 07/22/2021   Pneumonia due to COVID-19 virus 07/21/2021   TIA (transient ischemic attack) 05/30/2021   CAD (coronary artery disease) 11/26/2012   HTN (hypertension) 11/26/2012   Obesity (BMI 30-39.9) 11/26/2012   Dyslipidemia 11/26/2012    PCP: [name], MD   REFERRING PROVIDER: Hughie Closs, MD   REFERRING DIAG: Unsteadiness on feet (R26.81);Other abnormalities of gait and mobility (R26.89);Pain Pain - part of body:  (back)   Rationale for Evaluation and Treatment: Rehabilitation  THERAPY DIAG:  No diagnosis found.  ONSET DATE: ***  SUBJECTIVE:  SUBJECTIVE STATEMENT: ***  PERTINENT HISTORY:  ***  PAIN:  Are you having pain? {OPRCPAIN:27236}  PRECAUTIONS: {Therapy precautions:24002}  RED FLAGS: {PT Red Flags:29287}   WEIGHT BEARING RESTRICTIONS: {Yes ***/No:24003}  FALLS:  Has patient fallen in last 6 months? {fallsyesno:27318}  LIVING ENVIRONMENT: Lives with: {OPRC lives with:25569::"lives with their family"} Lives in: {Lives in:25570} Stairs: {opstairs:27293} Has following equipment at home: {Assistive devices:23999}  OCCUPATION: ***  PLOF: {PLOF:24004}  PATIENT GOALS: ***  NEXT MD VISIT:  ***  OBJECTIVE:   DIAGNOSTIC FINDINGS:  ***  PATIENT SURVEYS:  {rehab surveys:24030}  COGNITION: Overall cognitive status: {cognition:24006}     SENSATION: {sensation:27233}  MUSCLE LENGTH: Hamstrings: Right *** deg; Left *** deg Thomas test: Right *** deg; Left *** deg  POSTURE: {posture:25561}  PALPATION: ***  LUMBAR ROM:   AROM eval  Flexion   Extension   Right lateral flexion   Left lateral flexion   Right rotation   Left rotation    (Blank rows = not tested)  LOWER EXTREMITY ROM:      LOWER EXTREMITY MMT:    MMT Right eval Left eval  Hip flexion    Hip extension    Hip abduction    Hip adduction    Knee flexion    Knee extension    Ankle dorsiflexion    Ankle plantarflexion     (Blank rows = not tested)  LUMBAR SPECIAL TESTS:  {lumbar special test:25242}  FUNCTIONAL TESTS:  {Functional tests:24029}  GAIT: Distance walked: *** Assistive device utilized: {Assistive devices:23999} Level of assistance: {Levels of assistance:24026} Comments: ***  TODAY'S TREATMENT:                                                                                                                              DATE: ***    PATIENT EDUCATION:  Education details: *** Person educated: {Person educated:25204} Education method: {Education Method:25205} Education comprehension: {Education Comprehension:25206}  HOME EXERCISE PROGRAM: ***  ASSESSMENT:  CLINICAL IMPRESSION: Jeffery Barr  is a *** y.o. *** who was seen today for physical therapy evaluation and treatment for ***.    Patient presents with *** symptoms which are interfering with ADLs and are impacting quality of life.    Patient also presents with impaired activity tolerance, impaired standing balance, impaired ambulation, and decreased safety awareness impacting safe and independent functional mobility. Examination also reveals patient is at risk for falls and functional decline as evidenced by  the following objective test measures: Gait speed *** m/sec, (7m/sec is needed for community access), mCTSIB: position 1: *** sec, position 2: ***sec, position 3: ***sec, position 4: ***sec (30sec in each position demonstrates equal weighting of balance systems), TUG of ***sec (>12sec indicates increased risk for falls), and 5x sit to stand of ***sec (>15sec indicates increased risk for falls and decreased BLE power). Patient will benefit from skilled physical therapy services to help reach the maximal level of functional independence and mobility. Patient demonstrates understanding  of this plan of care and is in agreement with this plan.    OBJECTIVE IMPAIRMENTS: {opptimpairments:25111}.   ACTIVITY LIMITATIONS: {activitylimitations:27494}  PARTICIPATION LIMITATIONS: {participationrestrictions:25113}  PERSONAL FACTORS: {Personal factors:25162} are also affecting patient's functional outcome.   REHAB POTENTIAL: {rehabpotential:25112}  CLINICAL DECISION MAKING: {clinical decision making:25114}  EVALUATION COMPLEXITY: {Evaluation complexity:25115}   GOALS: Goals reviewed with patient? {yes/no:20286}  SHORT TERM GOALS: Target date: {follow up:25551}   Patient will be independent with initial HEP.  Baseline: *** Goal status: {GOALSTATUS:25110}   LONG TERM GOALS: Target date: {follow up:25551}   Patient will be independent with advanced/ongoing HEP to improve outcomes and carryover.  Baseline: *** Goal status: {GOALSTATUS:25110}  2.  Patient will report 75% improvement in low back pain to improve QOL.  Baseline: *** Goal status: {GOALSTATUS:25110}  3.  Patient will demonstrate full pain free lumbar ROM to perform ADLs.   Baseline: *** Goal status: {GOALSTATUS:25110}  4.  Patient will demonstrate improved functional strength as demonstrated by ***. Baseline: *** Goal status: {GOALSTATUS:25110}  5.  Patient will report at least 6 points improvement on modified Oswestry to  demonstrate improved functional ability.  Baseline: *** Goal status: {GOALSTATUS:25110}   6.  Patient will tolerate *** min of (standing/sitting/walking) to perform ***. Baseline: *** Goal status: {GOALSTATUS:25110}  7.   Patient will report centralization of radicular symptoms.  Baseline: *** Goal status: {GOALSTATUS:25110}  8. Patient will report *** on FOTO Baseline: *** Goal status: {GOALSTATUS:25110}   PLAN:  PT FREQUENCY: {rehab frequency:25116}  PT DURATION: {rehab duration:25117}  PLANNED INTERVENTIONS: {rehab planned interventions:25118::"97110-Therapeutic exercises","97530- Therapeutic [phone]- Neuromuscular re-education","97535- Self FAOZ","30865- Manual therapy"}.  PLAN FOR NEXT SESSION: ***   Jena Gauss, PT 12/23/2022, 5:03 PM

## 2022-12-24 DIAGNOSIS — I10 Essential (primary) hypertension: Secondary | ICD-10-CM | POA: Diagnosis not present

## 2022-12-25 ENCOUNTER — Other Ambulatory Visit: Payer: Self-pay

## 2022-12-25 ENCOUNTER — Ambulatory Visit: Payer: Medicare Other | Attending: Family Medicine | Admitting: Physical Therapy

## 2022-12-25 ENCOUNTER — Encounter: Payer: Self-pay | Admitting: Physical Therapy

## 2022-12-25 DIAGNOSIS — M6281 Muscle weakness (generalized): Secondary | ICD-10-CM | POA: Diagnosis not present

## 2022-12-25 DIAGNOSIS — M5459 Other low back pain: Secondary | ICD-10-CM | POA: Insufficient documentation

## 2022-12-25 DIAGNOSIS — R2689 Other abnormalities of gait and mobility: Secondary | ICD-10-CM | POA: Diagnosis not present

## 2022-12-25 LAB — BASIC METABOLIC PANEL
BUN/Creatinine Ratio: 16 (ref 10–24)
BUN: 20 mg/dL (ref 8–27)
CO2: 27 mmol/L (ref 20–29)
Calcium: 9.8 mg/dL (ref 8.6–10.2)
Chloride: 105 mmol/L (ref 96–106)
Creatinine, Ser: 1.23 mg/dL (ref 0.76–1.27)
Glucose: 84 mg/dL (ref 70–99)
Potassium: 4.8 mmol/L (ref 3.5–5.2)
Sodium: 147 mmol/L — ABNORMAL HIGH (ref 134–144)
eGFR: 59 mL/min/{1.73_m2} — ABNORMAL LOW (ref >59)

## 2022-12-27 ENCOUNTER — Telehealth: Payer: Self-pay

## 2022-12-27 NOTE — Telephone Encounter (Signed)
Left message on My Chart with normal results per Dr. Krasowski's note. Routed to PCP. 

## 2022-12-27 NOTE — Telephone Encounter (Signed)
Pt viewed results in My Chart per Dr. Krasowski's note. Routed to PCP.  

## 2022-12-30 ENCOUNTER — Ambulatory Visit: Payer: Medicare Other

## 2022-12-30 DIAGNOSIS — M5459 Other low back pain: Secondary | ICD-10-CM

## 2022-12-30 DIAGNOSIS — R2689 Other abnormalities of gait and mobility: Secondary | ICD-10-CM | POA: Diagnosis not present

## 2022-12-30 DIAGNOSIS — M6281 Muscle weakness (generalized): Secondary | ICD-10-CM

## 2022-12-30 NOTE — Therapy (Signed)
OUTPATIENT PHYSICAL THERAPY THORACOLUMBAR TREATMENT   Patient Name: ERI SCHU MRN: [id number] DOB:1940-12-25, 82 y.o., male Today's Date: 12/30/2022  END OF SESSION:  PT End of Session - 12/30/22 1022     Visit Number 2    Date for PT Re-Evaluation 03/05/23    Authorization Type Medicare + [employer] supplement    Progress Note Due on Visit 10    PT Start Time 1016    PT Stop Time 1058    PT Time Calculation (min) 42 min    Activity Tolerance Patient tolerated treatment well    Behavior During Therapy [facility] for tasks assessed/performed              Past Medical History:  Diagnosis Date   Acute hypoxic respiratory failure (HCC) 11/30/2022   Acute kidney injury superimposed on chronic kidney disease (HCC) 12/04/2022   Acute on chronic diastolic heart failure (HCC) 11/30/2022   Acute respiratory failure with hypoxia (HCC) 07/22/2021   Atrial fibrillation with RVR (HCC) 11/30/2022   Benign essential hypertension 08/27/2021   Benign hypertensive heart disease without heart failure    CAD (coronary artery disease) 11/26/2012   Chronic systolic CHF (congestive heart failure) (HCC) 07/22/2021   Coronary atherosclerosis of native coronary artery    S/P CABG   Dyslipidemia    Generalized weakness 07/22/2021   HTN (hypertension)    Hypoxic respiratory failure (HCC) 11/30/2022   NSTEMI (non-ST elevated myocardial infarction) (HCC) 11/30/2022   Obesity    Obesity (BMI 30-39.9) 11/26/2012   Obesity, class 1 12/04/2022   Other thrombophilia (HCC) 08/27/2021   Paroxysmal atrial fibrillation (HCC) 07/22/2021   Persistent atrial fibrillation (HCC) 08/27/2021   Pneumonia due to COVID-19 virus 07/21/2021   Pure hypercholesterolemia 08/27/2021   Sepsis secondary to UTI (HCC) 11/30/2022   Severe sepsis (HCC) 11/30/2022   Sinus bradycardia    a. baseline HR 50s.   TIA (transient ischemic attack) 05/30/2021   Transaminitis 07/22/2021   Past Surgical History:  Procedure Laterality  Date   CORONARY ARTERY BYPASS GRAFT     VASECTOMY     Patient Active Problem List   Diagnosis Date Noted   Obesity, class 1 12/04/2022   Acute kidney injury superimposed on chronic kidney disease (HCC) 12/04/2022   Sepsis secondary to UTI (HCC) 11/30/2022   NSTEMI (non-ST elevated myocardial infarction) (HCC) 11/30/2022   Severe sepsis (HCC) 11/30/2022   Hypoxic respiratory failure (HCC) 11/30/2022   Acute on chronic diastolic heart failure (HCC) 11/30/2022   Atrial fibrillation with RVR (HCC) 11/30/2022   Acute hypoxic respiratory failure (HCC) 11/30/2022   Persistent atrial fibrillation (HCC) 08/27/2021   Benign essential hypertension 08/27/2021   Other thrombophilia (HCC) 08/27/2021   Pure hypercholesterolemia 08/27/2021   Acute respiratory failure with hypoxia (HCC) 07/22/2021   Transaminitis 07/22/2021   Generalized weakness 07/22/2021   Paroxysmal atrial fibrillation (HCC) 07/22/2021   Chronic systolic CHF (congestive heart failure) (HCC) 07/22/2021   Pneumonia due to COVID-19 virus 07/21/2021   TIA (transient ischemic attack) 05/30/2021   CAD (coronary artery disease) 11/26/2012   HTN (hypertension) 11/26/2012   Obesity (BMI 30-39.9) 11/26/2012   Dyslipidemia 11/26/2012    PCP: [name], MD   REFERRING PROVIDER: Hughie Closs, MD   REFERRING DIAG: Unsteadiness on feet (R26.81);Other abnormalities of gait and mobility (R26.89);Pain Pain - part of body:  (back)   Rationale for Evaluation and Treatment: Rehabilitation  THERAPY DIAG:  Other low back pain  Other abnormalities of gait and mobility  Muscle weakness (  generalized)  ONSET DATE: ED to hospital admission 11/29/2022, DC on 12/05/2022  SUBJECTIVE:                                                                                                                                                                                           SUBJECTIVE STATEMENT: Patient wife accompanied him today. Rates no  pain while sitting but 8/10 when walking for longer periods.  PERTINENT HISTORY:  Sepsis secondary to UTI, Acute on chronic diastolic heart failure, Afib with RVR, HTN, dylipidemia, Acute kidney injury on CKD, history of CAD with CABG, TIA, h/o R knee arthroscopic surgery  PAIN:  Are you having pain? Yes: NPRS scale: 8/10 Pain location: L side low back/glute Pain description: dull Aggravating factors: walking distances, mornings it hurts Relieving factors: goes away after moving around a little  PRECAUTIONS: Fall  RED FLAGS: None   WEIGHT BEARING RESTRICTIONS: No  FALLS:  Has patient fallen in last 6 months? Yes. Number of falls 1 - at hospital  LIVING ENVIRONMENT: Lives with: lives with their spouse Lives in: House/apartment Stairs: No Has following equipment at home: None  OCCUPATION: retired  PLOF: Independent and Leisure: daily walks around the block ~ 15 min   PATIENT GOALS: get better  NEXT MD VISIT: had follow-up visit last week  OBJECTIVE:   DIAGNOSTIC FINDINGS:  11/29/2022 MRI Lumbar spine IMPRESSION: 1. Motion degraded exam. Additionally, Please note that this is a limited sagittal only MRI for screening of possible epidural abscess or other spinal infection. No axial images were performed. 2. No MRI evidence for acute infection within the cervical, thoracic, or lumbar spine. No epidural abscess. 3. Chronic bilateral pars defects at L5 with associated 6 mm spondylolisthesis, with resultant mild bilateral L5 foraminal stenosis. 4. Otherwise, no other significant disc pathology within the cervical, thoracic, or lumbar spine for age. No other stenosis or impingement.  PATIENT SURVEYS:  ABC scale 1100/1600; 68.8% Modified Oswestry 17/50   COGNITION: Overall cognitive status: Within functional limits for tasks assessed     SENSATION: WFL  MUSCLE LENGTH: Hamstrings: moderate tightness bil Thomas test: moderate tightness bil  POSTURE: rounded  shoulders, forward head, and decreased lumbar lordosis  PALPATION: Tenderness to PA mobs L2-4, L QL  LUMBAR ROM:   AROM eval  Flexion  To mid shin, pull L side low back  Extension WNL  Right lateral flexion To knee, pull L side  Left lateral flexion To knee  Right rotation WNL, pull L side   Left rotation WNL   (Blank rows = not tested)  LOWER EXTREMITY ROM:   Decreased L hip IR (20 deg)  compared to R hip (30 deg), postive FABER on L (groin pain) but negative scour test.    LOWER EXTREMITY MMT:    MMT Right eval Left eval  Hip flexion 4 4+  Hip extension 3+ 3+  Hip abduction 5 5  Hip adduction 5 5  Knee flexion 4+ 4+  Knee extension 5 5  Ankle dorsiflexion 5 5  Ankle plantarflexion 5 5   (Blank rows = not tested)  LUMBAR SPECIAL TESTS:  Straight leg raise test: Negative  FUNCTIONAL TESTS:  5 times sit to stand: 10.66 seconds without UE assist.  Timed up and go (TUG): 9.76 sec Dynamic Gait Index: 19/24 MCTSIB: Condition 1: Avg of 3 trials: 30 sec, Condition 2: Avg of 3 trials: 30 sec, Condition 3: Avg of 3 trials: 30 sec, Condition 4: Avg of 3 trials: 30 sec, and Total Score: 120/120 Gait speed 0.69 m/s Tandem L foot 30 , R foot 13 sec   GAIT: Distance walked: 45' Assistive device utilized: None Level of assistance: Complete Independence Comments: wide BOS, decreased heel strike on L, occasional shuffling, forward lean, decreased gait speed,   TODAY'S TREATMENT:                                                                                                                              DATE:  12/30/22 Therapeutic Exercise: to improve strength and mobility.  Demo, verbal and tactile cues throughout for technique.  Bike L2x66min Supine pelvic tilts 2 x 10  Supine figure 4 stretch 2 x 30 sec bil Bridges x 10 with TrA  SLR x 10 bil  Seated hip ABD RTB 10x3"  Manual: STM to L lumbar paraspinals and glutes/piriformis 12/25/22 EVAL only     PATIENT EDUCATION:   Education details: HEP Person educated: Patient Education method: Explanation Education comprehension: verbalized understanding  HOME EXERCISE PROGRAM: Access Code: [email or web address] URL: [email or web address] Date: 12/30/2022 Prepared by: Verta Ellen  Exercises - Supine Posterior Pelvic Tilt  - 1 x daily - 7 x weekly - 3 sets - 10 reps - Supine Figure 4 Piriformis Stretch  - 2 x daily - 7 x weekly - 3 sets - 3 reps - Supine Bridge  - 1 x daily - 7 x weekly - 3 sets - 10 reps  ASSESSMENT:  CLINICAL IMPRESSION: Tyberius Hladky Mcgourty  showed a fair tolerance for treatment. He shows difficulty with keeping his core engaged with exercises requiring cues. He requires cues to keep constant breathing during exercises as well. Also showed increased muscle tension in L low back and glutes. Patient will benefit from skilled physical therapy services to help reach the maximal level of functional independence and mobility and decrease pain.   Patient presents with low back pain, impaired activity tolerance, impaired standing balance, impaired ambulation, and decreased safety awareness impacting safe and independent functional mobility. Examination also reveals patient is at risk for falls and functional decline as evidenced by the following objective  test measures: Gait speed 0.69 m/sec, (72m/sec is needed for community access), DGI 19/24 (19-22 /24 indicates moderate fall risk in community dwelling adults), and tandem stand 30 sec R, 13 sec L (<20 sec high risk for falls). Patient will benefit from skilled physical therapy services to help reach the maximal level of functional independence and mobility and decrease pain. Patient demonstrates understanding of this plan of care and is in agreement with this plan.    OBJECTIVE IMPAIRMENTS: Abnormal gait, decreased activity tolerance, decreased balance, decreased endurance, decreased mobility, difficulty walking, decreased ROM, decreased strength,  hypomobility, increased muscle spasms, and pain.   ACTIVITY LIMITATIONS: carrying, lifting, bending, sitting, standing, squatting, sleeping, and locomotion level  PARTICIPATION LIMITATIONS: meal prep, cleaning, laundry, driving, shopping, and yard work  PERSONAL FACTORS: Age, Past/current experiences, Time since onset of injury/illness/exacerbation, and 3+ comorbidities: Sepsis secondary to UTI, Acute on chronic diastolic heart failure, Afib with RVR, HTN, dylipidemia, Acute kidney injury on CKD, history of CAD with CABG, TIA, h/o R knee arthroscopic surgery  are also affecting patient's functional outcome.   REHAB POTENTIAL: Good  CLINICAL DECISION MAKING: Evolving/moderate complexity  EVALUATION COMPLEXITY: Moderate   GOALS: Goals reviewed with patient? Yes  SHORT TERM GOALS: Target date: 01/15/2023   Patient will be independent with initial HEP.  Baseline: needs Goal status: IN PROGRESS   LONG TERM GOALS: Target date: 03/05/2023   Patient will be independent with advanced/ongoing HEP to improve outcomes and carryover.  Baseline:  Goal status: IN PROGRESS  2.  Patient will report 75% improvement in low back pain to improve QOL.  Baseline:  Goal status: IN PROGRESS  3.  Patient will demonstrate full pain free lumbar ROM to perform ADLs.   Baseline: see objective  Goal status: IN PROGRESS  4.  Patient will demonstrate improved functional strength as demonstrated by 4+/5 hip strength. Baseline: see objective Goal status: IN PROGRESS  5.  Patient will report at least 6 points improvement on modified Oswestry to demonstrate improved functional ability.  Baseline: 17/50 Goal status: IN PROGRESS   6.  Patient will demonstrate improved gait speed to 1.0 m/s for community navigation. Baseline: 0.69 m/s Goal status: IN PROGRESS  7.   Patient be able to maintain tandem stance > 35 sec each side to decrease risk of falls.   Baseline: : L foot forward 30 sec, R foot forward 13  sec, norm for age 30.49 seconds.  Goal status: IN PROGRESS  8. Patient will score >22/24 on DGI to decrease risk of falls.  Baseline: 19/24 Goal status: IN PROGRESS   PLAN:  PT FREQUENCY: 1-2x/week  PT DURATION: 10 weeks  PLANNED INTERVENTIONS: 97164- PT Re-evaluation, 97110-Therapeutic exercises, 97530- Therapeutic activity, 97112- Neuromuscular re-education, 97535- Self Care, 41324- Manual therapy, [id number]- Gait training, [phone]- Orthotic Fit/training, 97014- Electrical stimulation (unattended), [id number]- Electrical stimulation (manual), [id number]- Ultrasound, 72536- Traction (mechanical), Patient/Family education, Balance training, Stair training, Taping, Dry Needling, Joint mobilization, Joint manipulation, Spinal manipulation, Spinal mobilization, Cryotherapy, and Moist heat.  PLAN FOR NEXT SESSION: HEP for hip/core strengthening, manual therapy, modalities PRN.     Darleene Cleaver, PTA 12/30/2022, 11:52 AM

## 2023-01-03 ENCOUNTER — Ambulatory Visit: Payer: Medicare Other | Admitting: Physical Therapy

## 2023-01-03 ENCOUNTER — Encounter: Payer: Self-pay | Admitting: Physical Therapy

## 2023-01-03 DIAGNOSIS — M6281 Muscle weakness (generalized): Secondary | ICD-10-CM

## 2023-01-03 DIAGNOSIS — R2689 Other abnormalities of gait and mobility: Secondary | ICD-10-CM

## 2023-01-03 DIAGNOSIS — M5459 Other low back pain: Secondary | ICD-10-CM

## 2023-01-03 NOTE — Therapy (Signed)
OUTPATIENT PHYSICAL THERAPY THORACOLUMBAR TREATMENT   Patient Name: ATHARVA KOHEN MRN: [id number] DOB:[birth year], 82 y.o., male Today's Date: 01/03/2023  END OF SESSION:  PT End of Session - 01/03/23 1059     Visit Number 3    Date for PT Re-Evaluation 03/05/23    Authorization Type Medicare + [employer] supplement    Progress Note Due on Visit 10    PT Start Time 1100    PT Stop Time 1145    PT Time Calculation (min) 45 min    Activity Tolerance Patient tolerated treatment well    Behavior During Therapy [facility] for tasks assessed/performed              Past Medical History:  Diagnosis Date   Acute hypoxic respiratory failure (HCC) 11/30/2022   Acute kidney injury superimposed on chronic kidney disease (HCC) 12/04/2022   Acute on chronic diastolic heart failure (HCC) 11/30/2022   Acute respiratory failure with hypoxia (HCC) 07/22/2021   Atrial fibrillation with RVR (HCC) 11/30/2022   Benign essential hypertension 08/27/2021   Benign hypertensive heart disease without heart failure    CAD (coronary artery disease) 11/26/2012   Chronic systolic CHF (congestive heart failure) (HCC) 07/22/2021   Coronary atherosclerosis of native coronary artery    S/P CABG   Dyslipidemia    Generalized weakness 07/22/2021   HTN (hypertension)    Hypoxic respiratory failure (HCC) 11/30/2022   NSTEMI (non-ST elevated myocardial infarction) (HCC) 11/30/2022   Obesity    Obesity (BMI 30-39.9) 11/26/2012   Obesity, class 1 12/04/2022   Other thrombophilia (HCC) 08/27/2021   Paroxysmal atrial fibrillation (HCC) 07/22/2021   Persistent atrial fibrillation (HCC) 08/27/2021   Pneumonia due to COVID-19 virus 07/21/2021   Pure hypercholesterolemia 08/27/2021   Sepsis secondary to UTI (HCC) 11/30/2022   Severe sepsis (HCC) 11/30/2022   Sinus bradycardia    a. baseline HR 50s.   TIA (transient ischemic attack) 05/30/2021   Transaminitis 07/22/2021   Past Surgical History:  Procedure Laterality  Date   CORONARY ARTERY BYPASS GRAFT     VASECTOMY     Patient Active Problem List   Diagnosis Date Noted   Obesity, class 1 12/04/2022   Acute kidney injury superimposed on chronic kidney disease (HCC) 12/04/2022   Sepsis secondary to UTI (HCC) 11/30/2022   NSTEMI (non-ST elevated myocardial infarction) (HCC) 11/30/2022   Severe sepsis (HCC) 11/30/2022   Hypoxic respiratory failure (HCC) 11/30/2022   Acute on chronic diastolic heart failure (HCC) 11/30/2022   Atrial fibrillation with RVR (HCC) 11/30/2022   Acute hypoxic respiratory failure (HCC) 11/30/2022   Persistent atrial fibrillation (HCC) 08/27/2021   Benign essential hypertension 08/27/2021   Other thrombophilia (HCC) 08/27/2021   Pure hypercholesterolemia 08/27/2021   Acute respiratory failure with hypoxia (HCC) 07/22/2021   Transaminitis 07/22/2021   Generalized weakness 07/22/2021   Paroxysmal atrial fibrillation (HCC) 07/22/2021   Chronic systolic CHF (congestive heart failure) (HCC) 07/22/2021   Pneumonia due to COVID-19 virus 07/21/2021   TIA (transient ischemic attack) 05/30/2021   CAD (coronary artery disease) 11/26/2012   HTN (hypertension) 11/26/2012   Obesity (BMI 30-39.9) 11/26/2012   Dyslipidemia 11/26/2012    PCP: [name], MD   REFERRING PROVIDER: Hughie Closs, MD   REFERRING DIAG: Unsteadiness on feet (R26.81);Other abnormalities of gait and mobility (R26.89);Pain Pain - part of body:  (back)   Rationale for Evaluation and Treatment: Rehabilitation  THERAPY DIAG:  Other low back pain  Other abnormalities of gait and mobility  Muscle weakness (  generalized)  ONSET DATE: ED to hospital admission 11/29/2022, DC on 12/05/2022  SUBJECTIVE:                                                                                                                                                                                           SUBJECTIVE STATEMENT: Backs hurting, exercises are aggravating and  dropped a weight on his toe, not the best day.  PERTINENT HISTORY:  Sepsis secondary to UTI, Acute on chronic diastolic heart failure, Afib with RVR, HTN, dylipidemia, Acute kidney injury on CKD, history of CAD with CABG, TIA, h/o R knee arthroscopic surgery  PAIN:  Are you having pain? Yes: NPRS scale: 7-8/10 Pain location: L side low back/glute Pain description: dull Aggravating factors: walking distances, mornings it hurts Relieving factors: goes away after moving around a little  PRECAUTIONS: Fall  RED FLAGS: None   WEIGHT BEARING RESTRICTIONS: No  FALLS:  Has patient fallen in last 6 months? Yes. Number of falls 1 - at hospital  LIVING ENVIRONMENT: Lives with: lives with their spouse Lives in: House/apartment Stairs: No Has following equipment at home: None  OCCUPATION: retired  PLOF: Independent and Leisure: daily walks around the block ~ 15 min   PATIENT GOALS: get better  NEXT MD VISIT: had follow-up visit last week  OBJECTIVE:   DIAGNOSTIC FINDINGS:  11/29/2022 MRI Lumbar spine IMPRESSION: 1. Motion degraded exam. Additionally, Please note that this is a limited sagittal only MRI for screening of possible epidural abscess or other spinal infection. No axial images were performed. 2. No MRI evidence for acute infection within the cervical, thoracic, or lumbar spine. No epidural abscess. 3. Chronic bilateral pars defects at L5 with associated 6 mm spondylolisthesis, with resultant mild bilateral L5 foraminal stenosis. 4. Otherwise, no other significant disc pathology within the cervical, thoracic, or lumbar spine for age. No other stenosis or impingement.  PATIENT SURVEYS:  ABC scale 1100/1600; 68.8% Modified Oswestry 17/50   COGNITION: Overall cognitive status: Within functional limits for tasks assessed     SENSATION: WFL  MUSCLE LENGTH: Hamstrings: moderate tightness bil Thomas test: moderate tightness bil  POSTURE: rounded shoulders,  forward head, and decreased lumbar lordosis  PALPATION: Tenderness to PA mobs L2-4, L QL  LUMBAR ROM:   AROM eval  Flexion  To mid shin, pull L side low back  Extension WNL  Right lateral flexion To knee, pull L side  Left lateral flexion To knee  Right rotation WNL, pull L side   Left rotation WNL   (Blank rows = not tested)  LOWER EXTREMITY ROM:   Decreased L hip IR (20 deg) compared  to R hip (30 deg), postive FABER on L (groin pain) but negative scour test.    LOWER EXTREMITY MMT:    MMT Right eval Left eval  Hip flexion 4 4+  Hip extension 3+ 3+  Hip abduction 5 5  Hip adduction 5 5  Knee flexion 4+ 4+  Knee extension 5 5  Ankle dorsiflexion 5 5  Ankle plantarflexion 5 5   (Blank rows = not tested)  LUMBAR SPECIAL TESTS:  Straight leg raise test: Negative  FUNCTIONAL TESTS:  5 times sit to stand: 10.66 seconds without UE assist.  Timed up and go (TUG): 9.76 sec Dynamic Gait Index: 19/24 MCTSIB: Condition 1: Avg of 3 trials: 30 sec, Condition 2: Avg of 3 trials: 30 sec, Condition 3: Avg of 3 trials: 30 sec, Condition 4: Avg of 3 trials: 30 sec, and Total Score: 120/120 Gait speed 0.69 m/s Tandem L foot 30 , R foot 13 sec   GAIT: Distance walked: 43' Assistive device utilized: None Level of assistance: Complete Independence Comments: wide BOS, decreased heel strike on L, occasional shuffling, forward lean, decreased gait speed,   TODAY'S TREATMENT:                                                                                                                              DATE:   01/03/23 Therapeutic Exercise: to improve strength and mobility.  Demo, verbal and tactile cues throughout for technique. Nustep L5 x 6 min  At counter for safety: Heel raises 2 x 10  Toe raises 2 x 10  Hip abduction x 10 r/l Hip extension x 10 r/l Hip flexion x 10 r/l Squats 2 x 10 Standing rows x 10 RTB Paloff press RTB x 10 each side  12/30/22 Therapeutic Exercise: to  improve strength and mobility.  Demo, verbal and tactile cues throughout for technique.  Bike L2x49min Supine pelvic tilts 2 x 10  Supine figure 4 stretch 2 x 30 sec bil Bridges x 10 with TrA  SLR x 10 bil  Seated hip ABD RTB 10x3" Manual: STM to L lumbar paraspinals and glutes/piriformis  12/25/22 EVAL only     PATIENT EDUCATION:  Education details: HEP update Person educated: Patient Education method: Explanation, Demonstration, Verbal cues, and Handouts Education comprehension: verbalized understanding and returned demonstration  HOME EXERCISE PROGRAM: Access Code: [email or web address] URL: [email or web address] Date: 01/03/2023 Prepared by: Harrie Foreman  Exercises - Supine Posterior Pelvic Tilt  - 1 x daily - 7 x weekly - 3 sets - 10 reps - Supine Figure 4 Piriformis Stretch  - 2 x daily - 7 x weekly - 3 sets - 3 reps - Supine Bridge  - 1 x daily - 7 x weekly - 2 sets - 10 reps - Heel Raises with Counter Support  - 1 x daily - 7 x weekly - 2 sets - 10 reps - Toe Raises with Counter Support  - 1 x daily - 7 x  weekly - 2 sets - 10 reps - Standing Hip Extension with Counter Support  - 1 x daily - 7 x weekly - 2-3 sets - 10 reps - Standing Hip Abduction with Counter Support  - 1 x daily - 7 x weekly - 2-3 sets - 10 reps - Standing Hip Flexion with Counter Support  - 1 x daily - 7 x weekly - 2-3 sets - 10 reps - Mini Squat with Counter Support  - 1 x daily - 7 x weekly - 2 sets - 10 reps - Standing Single Leg Stance with Counter Support  - 1 x daily - 7 x weekly - 1 sets - 2 reps - 30 sec  hold - Standing Tandem Balance with Counter Support  - 1 x daily - 7 x weekly - 1 sets - 2 reps - 20 sec  hold - Standing Row with Anchored Resistance  - 1 x daily - 7 x weekly - 2 sets - 10 reps - Standing Anti-Rotation Press with Anchored Resistance  - 1 x daily - 7 x weekly - 2 sets - 10 reps  ASSESSMENT:  CLINICAL IMPRESSION: 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   Reported no pain at end of session and declined manual therapy.  Discussed supine exercises, he still wanted to perform them even though he does feel they cause some pain, agreed to back off and do further reps, as he was educated that performing painful movements could slow healing.  Donetta Potts Teaney continues to demonstrate potential for improvement and would benefit from continued skilled therapy to address impairments.     OBJECTIVE IMPAIRMENTS: Abnormal gait, decreased activity tolerance, decreased balance, decreased endurance, decreased mobility, difficulty walking, decreased ROM, decreased strength, hypomobility, increased muscle spasms, and pain.   ACTIVITY LIMITATIONS: carrying, lifting, bending, sitting, standing, squatting, sleeping, and locomotion level  PARTICIPATION LIMITATIONS: meal prep, cleaning, laundry, driving, shopping, and yard work  PERSONAL FACTORS: Age, Past/current experiences, Time since onset of injury/illness/exacerbation, and 3+ comorbidities: Sepsis secondary to UTI, Acute on chronic diastolic heart failure, Afib with RVR, HTN, dylipidemia, Acute kidney injury on CKD, history of CAD with CABG, TIA, h/o R knee arthroscopic surgery  are also affecting patient's functional outcome.   REHAB POTENTIAL: Good  CLINICAL DECISION MAKING: Evolving/moderate complexity  EVALUATION COMPLEXITY: Moderate   GOALS: Goals reviewed with patient? Yes  SHORT TERM GOALS: Target date: 01/15/2023   Patient will be independent with initial HEP.  Baseline: needs Goal status: IN PROGRESS  01/03/23 - good compliance.    LONG TERM GOALS: Target date: 03/05/2023   Patient will be independent with advanced/ongoing HEP to improve outcomes and carryover.  Baseline:  Goal status: IN PROGRESS  2.  Patient will report 75% improvement in low back pain to improve QOL.  Baseline:  Goal status: IN PROGRESS  3.   Patient will demonstrate full pain free lumbar ROM to perform ADLs.   Baseline: see objective  Goal status: IN PROGRESS  4.  Patient will demonstrate improved functional strength as demonstrated by 4+/5 hip strength. Baseline: see objective Goal status: IN PROGRESS  5.  Patient will report at least 6 points improvement on modified Oswestry to demonstrate improved functional ability.  Baseline: 17/50 Goal status: IN PROGRESS   6.  Patient will demonstrate improved gait speed to 1.0 m/s for community navigation. Baseline: 0.69 m/s Goal status: IN PROGRESS  7.   Patient  be able to maintain tandem stance > 35 sec each side to decrease risk of falls.   Baseline: : L foot forward 30 sec, R foot forward 13 sec, norm for age 44.49 seconds.  Goal status: IN PROGRESS  8. Patient will score >22/24 on DGI to decrease risk of falls.  Baseline: 19/24 Goal status: IN PROGRESS   PLAN:  PT FREQUENCY: 1-2x/week  PT DURATION: 10 weeks  PLANNED INTERVENTIONS: 97164- PT Re-evaluation, 97110-Therapeutic exercises, 97530- Therapeutic activity, 97112- Neuromuscular re-education, 97535- Self Care, 96045- Manual therapy, [id number]- Gait training, [phone]- Orthotic Fit/training, 97014- Electrical stimulation (unattended), [id number]- Electrical stimulation (manual), [id number]- Ultrasound, 19147- Traction (mechanical), Patient/Family education, Balance training, Stair training, Taping, Dry Needling, Joint mobilization, Joint manipulation, Spinal manipulation, Spinal mobilization, Cryotherapy, and Moist heat.  PLAN FOR NEXT SESSION: HEP for hip/core strengthening, manual therapy, modalities PRN.     Jena Gauss, PT 01/03/2023, 11:51 AM

## 2023-01-06 ENCOUNTER — Other Ambulatory Visit (HOSPITAL_COMMUNITY): Payer: Self-pay

## 2023-01-06 NOTE — Telephone Encounter (Signed)
Called for update, per [employer] representative, application is still processing. Patient may need samples until [employer] app is resolved.

## 2023-01-07 ENCOUNTER — Other Ambulatory Visit: Payer: Self-pay

## 2023-01-07 ENCOUNTER — Telehealth: Payer: Self-pay | Admitting: Cardiology

## 2023-01-07 MED ORDER — FUROSEMIDE 40 MG PO TABS: 40.0000 mg | ORAL_TABLET | Freq: Every day | ORAL | 3 refills | Status: DC

## 2023-01-07 NOTE — Telephone Encounter (Signed)
Spoke with spouse regarding pts medications. Sent refill for Lasix 40mg   #90 ref x 4

## 2023-01-07 NOTE — Telephone Encounter (Signed)
Wife calling with questions concerning the patient medication. Please advise

## 2023-01-07 NOTE — Telephone Encounter (Signed)
PAP: Patient assistance application for ELIQUIS has been approved by PAP Companies: [employer] from 01/07/23 to 02/18/23. Medication should be delivered to PAP Delivery: Home For further shipping updates, please contact [employer] ([employer]) at [phone] Pt ID is: [id number]

## 2023-01-08 ENCOUNTER — Ambulatory Visit: Payer: Medicare Other

## 2023-01-08 DIAGNOSIS — R2689 Other abnormalities of gait and mobility: Secondary | ICD-10-CM | POA: Diagnosis not present

## 2023-01-08 DIAGNOSIS — M6281 Muscle weakness (generalized): Secondary | ICD-10-CM | POA: Diagnosis not present

## 2023-01-08 DIAGNOSIS — M5459 Other low back pain: Secondary | ICD-10-CM | POA: Diagnosis not present

## 2023-01-08 NOTE — Therapy (Signed)
OUTPATIENT PHYSICAL THERAPY THORACOLUMBAR TREATMENT   Patient Name: Jeffery Barr MRN: [id number] DOB:[birth year], 82 y.o., male Today's Date: 01/08/2023  END OF SESSION:     Past Medical History:  Diagnosis Date   Acute hypoxic respiratory failure (HCC) 11/30/2022   Acute kidney injury superimposed on chronic kidney disease (HCC) 12/04/2022   Acute on chronic diastolic heart failure (HCC) 11/30/2022   Acute respiratory failure with hypoxia (HCC) 07/22/2021   Atrial fibrillation with RVR (HCC) 11/30/2022   Benign essential hypertension 08/27/2021   Benign hypertensive heart disease without heart failure    CAD (coronary artery disease) 11/26/2012   Chronic systolic CHF (congestive heart failure) (HCC) 07/22/2021   Coronary atherosclerosis of native coronary artery    S/P CABG   Dyslipidemia    Generalized weakness 07/22/2021   HTN (hypertension)    Hypoxic respiratory failure (HCC) 11/30/2022   NSTEMI (non-ST elevated myocardial infarction) (HCC) 11/30/2022   Obesity    Obesity (BMI 30-39.9) 11/26/2012   Obesity, class 1 12/04/2022   Other thrombophilia (HCC) 08/27/2021   Paroxysmal atrial fibrillation (HCC) 07/22/2021   Persistent atrial fibrillation (HCC) 08/27/2021   Pneumonia due to COVID-19 virus 07/21/2021   Pure hypercholesterolemia 08/27/2021   Sepsis secondary to UTI (HCC) 11/30/2022   Severe sepsis (HCC) 11/30/2022   Sinus bradycardia    a. baseline HR 50s.   TIA (transient ischemic attack) 05/30/2021   Transaminitis 07/22/2021   Past Surgical History:  Procedure Laterality Date   CORONARY ARTERY BYPASS GRAFT     VASECTOMY     Patient Active Problem List   Diagnosis Date Noted   Obesity, class 1 12/04/2022   Acute kidney injury superimposed on chronic kidney disease (HCC) 12/04/2022   Sepsis secondary to UTI (HCC) 11/30/2022   NSTEMI (non-ST elevated myocardial infarction) (HCC) 11/30/2022   Severe sepsis (HCC) 11/30/2022   Hypoxic respiratory  failure (HCC) 11/30/2022   Acute on chronic diastolic heart failure (HCC) 11/30/2022   Atrial fibrillation with RVR (HCC) 11/30/2022   Acute hypoxic respiratory failure (HCC) 11/30/2022   Persistent atrial fibrillation (HCC) 08/27/2021   Benign essential hypertension 08/27/2021   Other thrombophilia (HCC) 08/27/2021   Pure hypercholesterolemia 08/27/2021   Acute respiratory failure with hypoxia (HCC) 07/22/2021   Transaminitis 07/22/2021   Generalized weakness 07/22/2021   Paroxysmal atrial fibrillation (HCC) 07/22/2021   Chronic systolic CHF (congestive heart failure) (HCC) 07/22/2021   Pneumonia due to COVID-19 virus 07/21/2021   TIA (transient ischemic attack) 05/30/2021   CAD (coronary artery disease) 11/26/2012   HTN (hypertension) 11/26/2012   Obesity (BMI 30-39.9) 11/26/2012   Dyslipidemia 11/26/2012    PCP: [name], MD   REFERRING PROVIDER: Hughie Closs, MD   REFERRING DIAG: Unsteadiness on feet (R26.81);Other abnormalities of gait and mobility (R26.89);Pain Pain - part of body:  (back)   Rationale for Evaluation and Treatment: Rehabilitation  THERAPY DIAG:  Other low back pain  Other abnormalities of gait and mobility  Muscle weakness (generalized)  ONSET DATE: ED to hospital admission 11/29/2022, DC on 12/05/2022  SUBJECTIVE:  SUBJECTIVE STATEMENT: Pt reports he is stiff today, been pulling christmas decorations from the attic going up/down stairs, reports some fatigue from this.   PERTINENT HISTORY:  Sepsis secondary to UTI, Acute on chronic diastolic heart failure, Afib with RVR, HTN, dylipidemia, Acute kidney injury on CKD, history of CAD with CABG, TIA, h/o R knee arthroscopic surgery  PAIN:  Are you having pain? Yes: NPRS scale: 7-8/10 Pain location: L side low  back/glute Pain description: dull Aggravating factors: walking distances, mornings it hurts Relieving factors: goes away after moving around a little  PRECAUTIONS: Fall  RED FLAGS: None   WEIGHT BEARING RESTRICTIONS: No  FALLS:  Has patient fallen in last 6 months? Yes. Number of falls 1 - at hospital  LIVING ENVIRONMENT: Lives with: lives with their spouse Lives in: House/apartment Stairs: No Has following equipment at home: None  OCCUPATION: retired  PLOF: Independent and Leisure: daily walks around the block ~ 15 min   PATIENT GOALS: get better  NEXT MD VISIT: had follow-up visit last week  OBJECTIVE:   DIAGNOSTIC FINDINGS:  11/29/2022 MRI Lumbar spine IMPRESSION: 1. Motion degraded exam. Additionally, Please note that this is a limited sagittal only MRI for screening of possible epidural abscess or other spinal infection. No axial images were performed. 2. No MRI evidence for acute infection within the cervical, thoracic, or lumbar spine. No epidural abscess. 3. Chronic bilateral pars defects at L5 with associated 6 mm spondylolisthesis, with resultant mild bilateral L5 foraminal stenosis. 4. Otherwise, no other significant disc pathology within the cervical, thoracic, or lumbar spine for age. No other stenosis or impingement.  PATIENT SURVEYS:  ABC scale 1100/1600; 68.8% Modified Oswestry 17/50   COGNITION: Overall cognitive status: Within functional limits for tasks assessed     SENSATION: WFL  MUSCLE LENGTH: Hamstrings: moderate tightness bil Thomas test: moderate tightness bil  POSTURE: rounded shoulders, forward head, and decreased lumbar lordosis  PALPATION: Tenderness to PA mobs L2-4, L QL  LUMBAR ROM:   AROM eval  Flexion  To mid shin, pull L side low back  Extension WNL  Right lateral flexion To knee, pull L side  Left lateral flexion To knee  Right rotation WNL, pull L side   Left rotation WNL   (Blank rows = not  tested)  LOWER EXTREMITY ROM:   Decreased L hip IR (20 deg) compared to R hip (30 deg), postive FABER on L (groin pain) but negative scour test.    LOWER EXTREMITY MMT:    MMT Right eval Left eval  Hip flexion 4 4+  Hip extension 3+ 3+  Hip abduction 5 5  Hip adduction 5 5  Knee flexion 4+ 4+  Knee extension 5 5  Ankle dorsiflexion 5 5  Ankle plantarflexion 5 5   (Blank rows = not tested)  LUMBAR SPECIAL TESTS:  Straight leg raise test: Negative  FUNCTIONAL TESTS:  5 times sit to stand: 10.66 seconds without UE assist.  Timed up and go (TUG): 9.76 sec Dynamic Gait Index: 19/24 MCTSIB: Condition 1: Avg of 3 trials: 30 sec, Condition 2: Avg of 3 trials: 30 sec, Condition 3: Avg of 3 trials: 30 sec, Condition 4: Avg of 3 trials: 30 sec, and Total Score: 120/120 Gait speed 0.69 m/s Tandem L foot 30 , R foot 13 sec   GAIT: Distance walked: 50' Assistive device utilized: None Level of assistance: Complete Independence Comments: wide BOS, decreased heel strike on L, occasional shuffling, forward lean, decreased gait speed,  TODAY'S TREATMENT:                                                                                                                              DATE:  01/08/23 Therapeutic Exercise: to improve strength and mobility.  Demo, verbal and tactile cues throughout for technique. Nustep L5x79min UE/LE Squats 2x10 Standing row  with cable 15# 2x10 Standing shoulder ext 15# 2x10 Standing side bend x 10 each side Seated QL stretch x 30 sec bil  Manual Therapy: STM to L lumbar paraspinals and glutes/piriformis  01/03/23 Therapeutic Exercise: to improve strength and mobility.  Demo, verbal and tactile cues throughout for technique. Nustep L5 x 6 min  At counter for safety: Heel raises 2 x 10  Toe raises 2 x 10  Hip abduction x 10 r/l Hip extension x 10 r/l Hip flexion x 10 r/l Squats 2 x 10 Standing rows x 10 RTB Paloff press RTB x 10 each  side  12/30/22 Therapeutic Exercise: to improve strength and mobility.  Demo, verbal and tactile cues throughout for technique.  Bike L2x79min Supine pelvic tilts 2 x 10  Supine figure 4 stretch 2 x 30 sec bil Bridges x 10 with TrA  SLR x 10 bil  Seated hip ABD RTB 10x3" Manual: STM to L lumbar paraspinals and glutes/piriformis  12/25/22 EVAL only     PATIENT EDUCATION:  Education details: HEP update Person educated: Patient Education method: Explanation, Demonstration, Verbal cues, and Handouts Education comprehension: verbalized understanding and returned demonstration  HOME EXERCISE PROGRAM: Access Code: [email or web address] URL: [email or web address] Date: 01/03/2023 Prepared by: Harrie Foreman  Exercises - Supine Posterior Pelvic Tilt  - 1 x daily - 7 x weekly - 3 sets - 10 reps - Supine Figure 4 Piriformis Stretch  - 2 x daily - 7 x weekly - 3 sets - 3 reps - Supine Bridge  - 1 x daily - 7 x weekly - 2 sets - 10 reps - Heel Raises with Counter Support  - 1 x daily - 7 x weekly - 2 sets - 10 reps - Toe Raises with Counter Support  - 1 x daily - 7 x weekly - 2 sets - 10 reps - Standing Hip Extension with Counter Support  - 1 x daily - 7 x weekly - 2-3 sets - 10 reps - Standing Hip Abduction with Counter Support  - 1 x daily - 7 x weekly - 2-3 sets - 10 reps - Standing Hip Flexion with Counter Support  - 1 x daily - 7 x weekly - 2-3 sets - 10 reps - Mini Squat with Counter Support  - 1 x daily - 7 x weekly - 2 sets - 10 reps - Standing Single Leg Stance with Counter Support  - 1 x daily - 7 x weekly - 1 sets - 2 reps - 30 sec  hold - Standing Tandem Balance with Counter Support  - 1 x daily - 7  x weekly - 1 sets - 2 reps - 20 sec  hold - Standing Row with Anchored Resistance  - 1 x daily - 7 x weekly - 2 sets - 10 reps - Standing Anti-Rotation Press with Anchored Resistance  - 1 x daily - 7 x weekly - 2 sets - 10 reps  ASSESSMENT:  CLINICAL IMPRESSION: Payam Zero  Drewry showed a fair response to treatment. He still needs reminders to not push exercises to pain. Continued with standing exercises for core activation and hip strength. His side bending is pretty limited so we worked on some stretching today. He continues to have muscle tightness in the lumbar paraspinals and QL. Donetta Potts Maharaj continues to demonstrate potential for improvement and would benefit from continued skilled therapy to address impairments.     OBJECTIVE IMPAIRMENTS: Abnormal gait, decreased activity tolerance, decreased balance, decreased endurance, decreased mobility, difficulty walking, decreased ROM, decreased strength, hypomobility, increased muscle spasms, and pain.   ACTIVITY LIMITATIONS: carrying, lifting, bending, sitting, standing, squatting, sleeping, and locomotion level  PARTICIPATION LIMITATIONS: meal prep, cleaning, laundry, driving, shopping, and yard work  PERSONAL FACTORS: Age, Past/current experiences, Time since onset of injury/illness/exacerbation, and 3+ comorbidities: Sepsis secondary to UTI, Acute on chronic diastolic heart failure, Afib with RVR, HTN, dylipidemia, Acute kidney injury on CKD, history of CAD with CABG, TIA, h/o R knee arthroscopic surgery  are also affecting patient's functional outcome.   REHAB POTENTIAL: Good  CLINICAL DECISION MAKING: Evolving/moderate complexity  EVALUATION COMPLEXITY: Moderate   GOALS: Goals reviewed with patient? Yes  SHORT TERM GOALS: Target date: 01/15/2023   Patient will be independent with initial HEP.  Baseline: needs Goal status: IN PROGRESS  01/03/23 - good compliance.    LONG TERM GOALS: Target date: 03/05/2023   Patient will be independent with advanced/ongoing HEP to improve outcomes and carryover.  Baseline:  Goal status: IN PROGRESS  2.  Patient will report 75% improvement in low back pain to improve QOL.  Baseline:  Goal status: IN PROGRESS  3.  Patient will demonstrate full pain free  lumbar ROM to perform ADLs.   Baseline: see objective  Goal status: IN PROGRESS  4.  Patient will demonstrate improved functional strength as demonstrated by 4+/5 hip strength. Baseline: see objective Goal status: IN PROGRESS  5.  Patient will report at least 6 points improvement on modified Oswestry to demonstrate improved functional ability.  Baseline: 17/50 Goal status: IN PROGRESS   6.  Patient will demonstrate improved gait speed to 1.0 m/s for community navigation. Baseline: 0.69 m/s Goal status: IN PROGRESS  7.   Patient be able to maintain tandem stance > 35 sec each side to decrease risk of falls.   Baseline: : L foot forward 30 sec, R foot forward 13 sec, norm for age 74.49 seconds.  Goal status: IN PROGRESS  8. Patient will score >22/24 on DGI to decrease risk of falls.  Baseline: 19/24 Goal status: IN PROGRESS   PLAN:  PT FREQUENCY: 1-2x/week  PT DURATION: 10 weeks  PLANNED INTERVENTIONS: 97164- PT Re-evaluation, 97110-Therapeutic exercises, 97530- Therapeutic activity, 97112- Neuromuscular re-education, 97535- Self Care, 09811- Manual therapy, [id number]- Gait training, [phone]- Orthotic Fit/training, 97014- Electrical stimulation (unattended), [id number]- Electrical stimulation (manual), [id number]- Ultrasound, 29562- Traction (mechanical), Patient/Family education, Balance training, Stair training, Taping, Dry Needling, Joint mobilization, Joint manipulation, Spinal manipulation, Spinal mobilization, Cryotherapy, and Moist heat.  PLAN FOR NEXT SESSION: HEP for hip/core strengthening, manual therapy, modalities PRN.     Darleene Cleaver, PTA 01/08/2023,  1:16 PM

## 2023-01-10 ENCOUNTER — Other Ambulatory Visit: Payer: Self-pay

## 2023-01-10 DIAGNOSIS — I509 Heart failure, unspecified: Secondary | ICD-10-CM

## 2023-01-13 ENCOUNTER — Telehealth: Payer: Self-pay | Admitting: *Deleted

## 2023-01-13 ENCOUNTER — Ambulatory Visit: Payer: Medicare Other

## 2023-01-13 DIAGNOSIS — R2689 Other abnormalities of gait and mobility: Secondary | ICD-10-CM

## 2023-01-13 DIAGNOSIS — M5459 Other low back pain: Secondary | ICD-10-CM | POA: Diagnosis not present

## 2023-01-13 DIAGNOSIS — M6281 Muscle weakness (generalized): Secondary | ICD-10-CM

## 2023-01-13 NOTE — Therapy (Signed)
OUTPATIENT PHYSICAL THERAPY THORACOLUMBAR TREATMENT   Patient Name: Jeffery Barr MRN: [id number] DOB:1940-11-10, 82 y.o., male Today's Date: 01/13/2023  END OF SESSION:  PT End of Session - 01/13/23 1059     Visit Number 5    Date for PT Re-Evaluation 03/05/23    Authorization Type Medicare + [employer] supplement    Progress Note Due on Visit 10    PT Start Time 1022   pt late   PT Stop Time 1057    PT Time Calculation (min) 35 min    Activity Tolerance Patient tolerated treatment well    Behavior During Therapy [facility] for tasks assessed/performed               Past Medical History:  Diagnosis Date   Acute hypoxic respiratory failure (HCC) 11/30/2022   Acute kidney injury superimposed on chronic kidney disease (HCC) 12/04/2022   Acute on chronic diastolic heart failure (HCC) 11/30/2022   Acute respiratory failure with hypoxia (HCC) 07/22/2021   Atrial fibrillation with RVR (HCC) 11/30/2022   Benign essential hypertension 08/27/2021   Benign hypertensive heart disease without heart failure    CAD (coronary artery disease) 11/26/2012   Chronic systolic CHF (congestive heart failure) (HCC) 07/22/2021   Coronary atherosclerosis of native coronary artery    S/P CABG   Dyslipidemia    Generalized weakness 07/22/2021   HTN (hypertension)    Hypoxic respiratory failure (HCC) 11/30/2022   NSTEMI (non-ST elevated myocardial infarction) (HCC) 11/30/2022   Obesity    Obesity (BMI 30-39.9) 11/26/2012   Obesity, class 1 12/04/2022   Other thrombophilia (HCC) 08/27/2021   Paroxysmal atrial fibrillation (HCC) 07/22/2021   Persistent atrial fibrillation (HCC) 08/27/2021   Pneumonia due to COVID-19 virus 07/21/2021   Pure hypercholesterolemia 08/27/2021   Sepsis secondary to UTI (HCC) 11/30/2022   Severe sepsis (HCC) 11/30/2022   Sinus bradycardia    a. baseline HR 50s.   TIA (transient ischemic attack) 05/30/2021   Transaminitis 07/22/2021   Past Surgical History:  Procedure  Laterality Date   CORONARY ARTERY BYPASS GRAFT     VASECTOMY     Patient Active Problem List   Diagnosis Date Noted   Obesity, class 1 12/04/2022   Acute kidney injury superimposed on chronic kidney disease (HCC) 12/04/2022   Sepsis secondary to UTI (HCC) 11/30/2022   NSTEMI (non-ST elevated myocardial infarction) (HCC) 11/30/2022   Severe sepsis (HCC) 11/30/2022   Hypoxic respiratory failure (HCC) 11/30/2022   Acute on chronic diastolic heart failure (HCC) 11/30/2022   Atrial fibrillation with RVR (HCC) 11/30/2022   Acute hypoxic respiratory failure (HCC) 11/30/2022   Persistent atrial fibrillation (HCC) 08/27/2021   Benign essential hypertension 08/27/2021   Other thrombophilia (HCC) 08/27/2021   Pure hypercholesterolemia 08/27/2021   Acute respiratory failure with hypoxia (HCC) 07/22/2021   Transaminitis 07/22/2021   Generalized weakness 07/22/2021   Paroxysmal atrial fibrillation (HCC) 07/22/2021   Chronic systolic CHF (congestive heart failure) (HCC) 07/22/2021   Pneumonia due to COVID-19 virus 07/21/2021   TIA (transient ischemic attack) 05/30/2021   CAD (coronary artery disease) 11/26/2012   HTN (hypertension) 11/26/2012   Obesity (BMI 30-39.9) 11/26/2012   Dyslipidemia 11/26/2012    PCP: [name], MD   REFERRING PROVIDER: Hughie Closs, MD   REFERRING DIAG: Unsteadiness on feet (R26.81);Other abnormalities of gait and mobility (R26.89);Pain Pain - part of body:  (back)   Rationale for Evaluation and Treatment: Rehabilitation  THERAPY DIAG:  Other low back pain  Other abnormalities of gait and  mobility  Muscle weakness (generalized)  ONSET DATE: ED to hospital admission 11/29/2022, DC on 12/05/2022  SUBJECTIVE:                                                                                                                                                                                           SUBJECTIVE STATEMENT: Pt reports being put on new  medication since the last time he was here, he is unable to recall the medication but reports that his legs swelled up after using it.    PERTINENT HISTORY:  Sepsis secondary to UTI, Acute on chronic diastolic heart failure, Afib with RVR, HTN, dylipidemia, Acute kidney injury on CKD, history of CAD with CABG, TIA, h/o R knee arthroscopic surgery  PAIN:  Are you having pain? Yes: NPRS scale: 4/10 Pain location: L side low back/glute Pain description: dull Aggravating factors: walking distances, mornings it hurts Relieving factors: goes away after moving around a little  PRECAUTIONS: Fall  RED FLAGS: None   WEIGHT BEARING RESTRICTIONS: No  FALLS:  Has patient fallen in last 6 months? Yes. Number of falls 1 - at hospital  LIVING ENVIRONMENT: Lives with: lives with their spouse Lives in: House/apartment Stairs: No Has following equipment at home: None  OCCUPATION: retired  PLOF: Independent and Leisure: daily walks around the block ~ 15 min   PATIENT GOALS: get better  NEXT MD VISIT: had follow-up visit last week  OBJECTIVE:   DIAGNOSTIC FINDINGS:  11/29/2022 MRI Lumbar spine IMPRESSION: 1. Motion degraded exam. Additionally, Please note that this is a limited sagittal only MRI for screening of possible epidural abscess or other spinal infection. No axial images were performed. 2. No MRI evidence for acute infection within the cervical, thoracic, or lumbar spine. No epidural abscess. 3. Chronic bilateral pars defects at L5 with associated 6 mm spondylolisthesis, with resultant mild bilateral L5 foraminal stenosis. 4. Otherwise, no other significant disc pathology within the cervical, thoracic, or lumbar spine for age. No other stenosis or impingement.  PATIENT SURVEYS:  ABC scale 1100/1600; 68.8% Modified Oswestry 17/50   COGNITION: Overall cognitive status: Within functional limits for tasks assessed     SENSATION: WFL  MUSCLE LENGTH: Hamstrings: moderate  tightness bil Thomas test: moderate tightness bil  POSTURE: rounded shoulders, forward head, and decreased lumbar lordosis  PALPATION: Tenderness to PA mobs L2-4, L QL  LUMBAR ROM:   AROM eval  Flexion  To mid shin, pull L side low back  Extension WNL  Right lateral flexion To knee, pull L side  Left lateral flexion To knee  Right rotation WNL, pull L side   Left rotation  WNL   (Blank rows = not tested)  LOWER EXTREMITY ROM:   Decreased L hip IR (20 deg) compared to R hip (30 deg), postive FABER on L (groin pain) but negative scour test.    LOWER EXTREMITY MMT:    MMT Right eval Left eval  Hip flexion 4 4+  Hip extension 3+ 3+  Hip abduction 5 5  Hip adduction 5 5  Knee flexion 4+ 4+  Knee extension 5 5  Ankle dorsiflexion 5 5  Ankle plantarflexion 5 5   (Blank rows = not tested)  LUMBAR SPECIAL TESTS:  Straight leg raise test: Negative  FUNCTIONAL TESTS:  5 times sit to stand: 10.66 seconds without UE assist.  Timed up and go (TUG): 9.76 sec Dynamic Gait Index: 19/24 MCTSIB: Condition 1: Avg of 3 trials: 30 sec, Condition 2: Avg of 3 trials: 30 sec, Condition 3: Avg of 3 trials: 30 sec, Condition 4: Avg of 3 trials: 30 sec, and Total Score: 120/120 Gait speed 0.69 m/s Tandem L foot 30 , R foot 13 sec   GAIT: Distance walked: 10' Assistive device utilized: None Level of assistance: Complete Independence Comments: wide BOS, decreased heel strike on L, occasional shuffling, forward lean, decreased gait speed,   TODAY'S TREATMENT:                                                                                                                              DATE:  01/08/23 Therapeutic Exercise: to improve strength and mobility.  Demo, verbal and tactile cues throughout for technique. Rec Bike L2x66min Seated row 20# 2x10 high grips Seated lat pulls- increased LBP Standing rows with cable 20# x 10 Farmer carry 10lb 2 laps 90 ft Standing side bend with 10lb  weight x 10  Seated QL stretch 2x15" eahc way Standing HS curls x 10 bil Standing hip extension x 10 bil  01/08/23 Therapeutic Exercise: to improve strength and mobility.  Demo, verbal and tactile cues throughout for technique. Nustep L5x62min UE/LE Squats 2x10 Standing row  with cable 15# 2x10 Standing shoulder ext 15# 2x10 Standing side bend x 10 each side Seated QL stretch x 30 sec bil  Manual Therapy: STM to L lumbar paraspinals and glutes/piriformis  01/03/23 Therapeutic Exercise: to improve strength and mobility.  Demo, verbal and tactile cues throughout for technique. Nustep L5 x 6 min  At counter for safety: Heel raises 2 x 10  Toe raises 2 x 10  Hip abduction x 10 r/l Hip extension x 10 r/l Hip flexion x 10 r/l Squats 2 x 10 Standing rows x 10 RTB Paloff press RTB x 10 each side  12/30/22 Therapeutic Exercise: to improve strength and mobility.  Demo, verbal and tactile cues throughout for technique.  Bike L2x60min Supine pelvic tilts 2 x 10  Supine figure 4 stretch 2 x 30 sec bil Bridges x 10 with TrA  SLR x 10 bil  Seated hip ABD RTB 10x3"  Manual: STM to L lumbar paraspinals and glutes/piriformis  12/25/22 EVAL only     PATIENT EDUCATION:  Education details: HEP update Person educated: Patient Education method: Explanation, Demonstration, Verbal cues, and Handouts Education comprehension: verbalized understanding and returned demonstration  HOME EXERCISE PROGRAM: Access Code: [email or web address] URL: [email or web address] Date: 01/03/2023 Prepared by: Harrie Foreman  Exercises - Supine Posterior Pelvic Tilt  - 1 x daily - 7 x weekly - 3 sets - 10 reps - Supine Figure 4 Piriformis Stretch  - 2 x daily - 7 x weekly - 3 sets - 3 reps - Supine Bridge  - 1 x daily - 7 x weekly - 2 sets - 10 reps - Heel Raises with Counter Support  - 1 x daily - 7 x weekly - 2 sets - 10 reps - Toe Raises with Counter Support  - 1 x daily - 7 x weekly - 2 sets - 10  reps - Standing Hip Extension with Counter Support  - 1 x daily - 7 x weekly - 2-3 sets - 10 reps - Standing Hip Abduction with Counter Support  - 1 x daily - 7 x weekly - 2-3 sets - 10 reps - Standing Hip Flexion with Counter Support  - 1 x daily - 7 x weekly - 2-3 sets - 10 reps - Mini Squat with Counter Support  - 1 x daily - 7 x weekly - 2 sets - 10 reps - Standing Single Leg Stance with Counter Support  - 1 x daily - 7 x weekly - 1 sets - 2 reps - 30 sec  hold - Standing Tandem Balance with Counter Support  - 1 x daily - 7 x weekly - 1 sets - 2 reps - 20 sec  hold - Standing Row with Anchored Resistance  - 1 x daily - 7 x weekly - 2 sets - 10 reps - Standing Anti-Rotation Press with Anchored Resistance  - 1 x daily - 7 x weekly - 2 sets - 10 reps  ASSESSMENT:  CLINICAL IMPRESSION: Continued with standing exercises for core activation and hip strength. Cues with the seated rows to keep shoulders depressed and for periscap muscle engagement. He continues to show decreased lumbar side bending mobility and QL tightness. He did report being on a medication which has been making him retain fluid in his legs but he has stopped taking which has helped with leg swelling.  Donetta [place] continues to demonstrate potential for improvement and would benefit from continued skilled therapy to address impairments.     OBJECTIVE IMPAIRMENTS: Abnormal gait, decreased activity tolerance, decreased balance, decreased endurance, decreased mobility, difficulty walking, decreased ROM, decreased strength, hypomobility, increased muscle spasms, and pain.   ACTIVITY LIMITATIONS: carrying, lifting, bending, sitting, standing, squatting, sleeping, and locomotion level  PARTICIPATION LIMITATIONS: meal prep, cleaning, laundry, driving, shopping, and yard work  PERSONAL FACTORS: Age, Past/current experiences, Time since onset of injury/illness/exacerbation, and 3+ comorbidities: Sepsis secondary to UTI, Acute on  chronic diastolic heart failure, Afib with RVR, HTN, dylipidemia, Acute kidney injury on CKD, history of CAD with CABG, TIA, h/o R knee arthroscopic surgery  are also affecting patient's functional outcome.   REHAB POTENTIAL: Good  CLINICAL DECISION MAKING: Evolving/moderate complexity  EVALUATION COMPLEXITY: Moderate   GOALS: Goals reviewed with patient? Yes  SHORT TERM GOALS: Target date: 01/15/2023   Patient will be independent with initial HEP.  Baseline: needs Goal status: IN PROGRESS  01/03/23 - good compliance.    LONG TERM GOALS: Target  date: 03/05/2023   Patient will be independent with advanced/ongoing HEP to improve outcomes and carryover.  Baseline:  Goal status: IN PROGRESS  2.  Patient will report 75% improvement in low back pain to improve QOL.  Baseline:  Goal status: IN PROGRESS- 01/13/23 pt reports 80-90% improvement  3.  Patient will demonstrate full pain free lumbar ROM to perform ADLs.   Baseline: see objective  Goal status: IN PROGRESS  4.  Patient will demonstrate improved functional strength as demonstrated by 4+/5 hip strength. Baseline: see objective Goal status: IN PROGRESS  5.  Patient will report at least 6 points improvement on modified Oswestry to demonstrate improved functional ability.  Baseline: 17/50 Goal status: IN PROGRESS   6.  Patient will demonstrate improved gait speed to 1.0 m/s for community navigation. Baseline: 0.69 m/s Goal status: IN PROGRESS  7.   Patient be able to maintain tandem stance > 35 sec each side to decrease risk of falls.   Baseline: : L foot forward 30 sec, R foot forward 13 sec, norm for age 27.49 seconds.  Goal status: IN PROGRESS  8. Patient will score >22/24 on DGI to decrease risk of falls.  Baseline: 19/24 Goal status: IN PROGRESS   PLAN:  PT FREQUENCY: 1-2x/week  PT DURATION: 10 weeks  PLANNED INTERVENTIONS: 97164- PT Re-evaluation, 97110-Therapeutic exercises, 97530- Therapeutic activity,  97112- Neuromuscular re-education, 97535- Self Care, 16109- Manual therapy, [id number]- Gait training, [phone]- Orthotic Fit/training, 97014- Electrical stimulation (unattended), [id number]- Electrical stimulation (manual), [id number]- Ultrasound, 09811- Traction (mechanical), Patient/Family education, Balance training, Stair training, Taping, Dry Needling, Joint mobilization, Joint manipulation, Spinal manipulation, Spinal mobilization, Cryotherapy, and Moist heat.  PLAN FOR NEXT SESSION: HEP for hip/core strengthening, manual therapy, modalities PRN.     Darleene Cleaver, PTA 01/13/2023, 11:00 AM

## 2023-01-13 NOTE — Progress Notes (Signed)
  Care Coordination   Note   01/13/2023 Name: Jeffery Barr MRN: [id number] DOB: 02-08-1941  [name] is a 82 y.o. year old male who sees [name], MD for primary care. I reached out to [name] by phone today to offer care coordination services.  [name] was given information about Care Coordination services today including:   The Care Coordination services include support from the care team which includes your Nurse Coordinator, Clinical Social Worker, or Pharmacist.  The Care Coordination team is here to help remove barriers to the health concerns and goals most important to you. Care Coordination services are voluntary, and the patient may decline or stop services at any time by request to their care team member.   Care Coordination Consent Status: Patient did not agree to participate in care coordination services at this time.  Follow up plan:  pt declined services at this time - says they will contact pcp next week   Encounter Outcome:  Patient Refused  [name], [facility] Care Coordination Care Guide Direct Dial: [phone]

## 2023-01-15 ENCOUNTER — Ambulatory Visit: Payer: Medicare Other

## 2023-01-15 DIAGNOSIS — M5459 Other low back pain: Secondary | ICD-10-CM

## 2023-01-15 DIAGNOSIS — R2689 Other abnormalities of gait and mobility: Secondary | ICD-10-CM

## 2023-01-15 DIAGNOSIS — M6281 Muscle weakness (generalized): Secondary | ICD-10-CM | POA: Diagnosis not present

## 2023-01-15 NOTE — Therapy (Signed)
OUTPATIENT PHYSICAL THERAPY THORACOLUMBAR TREATMENT   Patient Name: Jeffery Barr MRN: [id number] DOB:01/01/1941, 82 y.o., male Today's Date: 01/15/2023  END OF SESSION:  PT End of Session - 01/15/23 1418     Visit Number 6    Date for PT Re-Evaluation 03/05/23    Authorization Type Medicare + [employer] supplement    Progress Note Due on Visit 10    PT Start Time 1315    PT Stop Time 1357    PT Time Calculation (min) 42 min    Activity Tolerance Patient tolerated treatment well    Behavior During Therapy [facility] for tasks assessed/performed                Past Medical History:  Diagnosis Date   Acute hypoxic respiratory failure (HCC) 11/30/2022   Acute kidney injury superimposed on chronic kidney disease (HCC) 12/04/2022   Acute on chronic diastolic heart failure (HCC) 11/30/2022   Acute respiratory failure with hypoxia (HCC) 07/22/2021   Atrial fibrillation with RVR (HCC) 11/30/2022   Benign essential hypertension 08/27/2021   Benign hypertensive heart disease without heart failure    CAD (coronary artery disease) 11/26/2012   Chronic systolic CHF (congestive heart failure) (HCC) 07/22/2021   Coronary atherosclerosis of native coronary artery    S/P CABG   Dyslipidemia    Generalized weakness 07/22/2021   HTN (hypertension)    Hypoxic respiratory failure (HCC) 11/30/2022   NSTEMI (non-ST elevated myocardial infarction) (HCC) 11/30/2022   Obesity    Obesity (BMI 30-39.9) 11/26/2012   Obesity, class 1 12/04/2022   Other thrombophilia (HCC) 08/27/2021   Paroxysmal atrial fibrillation (HCC) 07/22/2021   Persistent atrial fibrillation (HCC) 08/27/2021   Pneumonia due to COVID-19 virus 07/21/2021   Pure hypercholesterolemia 08/27/2021   Sepsis secondary to UTI (HCC) 11/30/2022   Severe sepsis (HCC) 11/30/2022   Sinus bradycardia    a. baseline HR 50s.   TIA (transient ischemic attack) 05/30/2021   Transaminitis 07/22/2021   Past Surgical History:  Procedure  Laterality Date   CORONARY ARTERY BYPASS GRAFT     VASECTOMY     Patient Active Problem List   Diagnosis Date Noted   Obesity, class 1 12/04/2022   Acute kidney injury superimposed on chronic kidney disease (HCC) 12/04/2022   Sepsis secondary to UTI (HCC) 11/30/2022   NSTEMI (non-ST elevated myocardial infarction) (HCC) 11/30/2022   Severe sepsis (HCC) 11/30/2022   Hypoxic respiratory failure (HCC) 11/30/2022   Acute on chronic diastolic heart failure (HCC) 11/30/2022   Atrial fibrillation with RVR (HCC) 11/30/2022   Acute hypoxic respiratory failure (HCC) 11/30/2022   Persistent atrial fibrillation (HCC) 08/27/2021   Benign essential hypertension 08/27/2021   Other thrombophilia (HCC) 08/27/2021   Pure hypercholesterolemia 08/27/2021   Acute respiratory failure with hypoxia (HCC) 07/22/2021   Transaminitis 07/22/2021   Generalized weakness 07/22/2021   Paroxysmal atrial fibrillation (HCC) 07/22/2021   Chronic systolic CHF (congestive heart failure) (HCC) 07/22/2021   Pneumonia due to COVID-19 virus 07/21/2021   TIA (transient ischemic attack) 05/30/2021   CAD (coronary artery disease) 11/26/2012   HTN (hypertension) 11/26/2012   Obesity (BMI 30-39.9) 11/26/2012   Dyslipidemia 11/26/2012    PCP: [name], MD   REFERRING PROVIDER: Hughie Closs, MD   REFERRING DIAG: Unsteadiness on feet (R26.81);Other abnormalities of gait and mobility (R26.89);Pain Pain - part of body:  (back)   Rationale for Evaluation and Treatment: Rehabilitation  THERAPY DIAG:  Other low back pain  Other abnormalities of gait and mobility  Muscle weakness (generalized)  ONSET DATE: ED to hospital admission 11/29/2022, DC on 12/05/2022  SUBJECTIVE:                                                                                                                                                                                           SUBJECTIVE STATEMENT: Pt reports being sore everywhere but  no pain he has been running around a lot today. The swelling in his lower legs are coming down PERTINENT HISTORY:  Sepsis secondary to UTI, Acute on chronic diastolic heart failure, Afib with RVR, HTN, dylipidemia, Acute kidney injury on CKD, history of CAD with CABG, TIA, h/o R knee arthroscopic surgery  PAIN:  Are you having pain? Yes: NPRS scale: 4/10 Pain location: L side low back/glute Pain description: dull Aggravating factors: walking distances, mornings it hurts Relieving factors: goes away after moving around a little  PRECAUTIONS: Fall  RED FLAGS: None   WEIGHT BEARING RESTRICTIONS: No  FALLS:  Has patient fallen in last 6 months? Yes. Number of falls 1 - at hospital  LIVING ENVIRONMENT: Lives with: lives with their spouse Lives in: House/apartment Stairs: No Has following equipment at home: None  OCCUPATION: retired  PLOF: Independent and Leisure: daily walks around the block ~ 15 min   PATIENT GOALS: get better  NEXT MD VISIT: had follow-up visit last week  OBJECTIVE:   DIAGNOSTIC FINDINGS:  11/29/2022 MRI Lumbar spine IMPRESSION: 1. Motion degraded exam. Additionally, Please note that this is a limited sagittal only MRI for screening of possible epidural abscess or other spinal infection. No axial images were performed. 2. No MRI evidence for acute infection within the cervical, thoracic, or lumbar spine. No epidural abscess. 3. Chronic bilateral pars defects at L5 with associated 6 mm spondylolisthesis, with resultant mild bilateral L5 foraminal stenosis. 4. Otherwise, no other significant disc pathology within the cervical, thoracic, or lumbar spine for age. No other stenosis or impingement.  PATIENT SURVEYS:  ABC scale 1100/1600; 68.8% Modified Oswestry 17/50   COGNITION: Overall cognitive status: Within functional limits for tasks assessed     SENSATION: WFL  MUSCLE LENGTH: Hamstrings: moderate tightness bil Thomas test: moderate  tightness bil  POSTURE: rounded shoulders, forward head, and decreased lumbar lordosis  PALPATION: Tenderness to PA mobs L2-4, L QL  LUMBAR ROM:   AROM eval  Flexion  To mid shin, pull L side low back  Extension WNL  Right lateral flexion To knee, pull L side  Left lateral flexion To knee  Right rotation WNL, pull L side   Left rotation WNL   (Blank rows = not tested)  LOWER EXTREMITY  ROM:   Decreased L hip IR (20 deg) compared to R hip (30 deg), postive FABER on L (groin pain) but negative scour test.    LOWER EXTREMITY MMT:    MMT Right eval Left eval  Hip flexion 4 4+  Hip extension 3+ 3+  Hip abduction 5 5  Hip adduction 5 5  Knee flexion 4+ 4+  Knee extension 5 5  Ankle dorsiflexion 5 5  Ankle plantarflexion 5 5   (Blank rows = not tested)  LUMBAR SPECIAL TESTS:  Straight leg raise test: Negative  FUNCTIONAL TESTS:  5 times sit to stand: 10.66 seconds without UE assist.  Timed up and go (TUG): 9.76 sec Dynamic Gait Index: 19/24 MCTSIB: Condition 1: Avg of 3 trials: 30 sec, Condition 2: Avg of 3 trials: 30 sec, Condition 3: Avg of 3 trials: 30 sec, Condition 4: Avg of 3 trials: 30 sec, and Total Score: 120/120 Gait speed 0.69 m/s Tandem L foot 30 , R foot 13 sec   GAIT: Distance walked: 7' Assistive device utilized: None Level of assistance: Complete Independence Comments: wide BOS, decreased heel strike on L, occasional shuffling, forward lean, decreased gait speed,   TODAY'S TREATMENT:                                                                                                                              DATE: 01/15/23  Therapeutic Exercise: to improve strength and mobility.  Demo, verbal and tactile cues throughout for technique. Rec Bike L2x31min Supine LTR each direction with orange pball x 10  Supine HS curl with orange pball 2x10 Supine SKTC 2x30" B Supine KTOS piriformis stretch 2x30" B Supine ball squeeze hip ADD 2x10 Farmer walks 10lb  B - cues to keep arms to side 3x90 ft  01/08/23 Therapeutic Exercise: to improve strength and mobility.  Demo, verbal and tactile cues throughout for technique. Rec Bike L2x39min Seated row 20# 2x10 high grips Seated lat pulls- increased LBP Standing rows with cable 20# x 10 Farmer carry 10lb 2 laps 90 ft Standing side bend with 10lb weight x 10  Seated QL stretch 2x15" eahc way Standing HS curls x 10 bil Standing hip extension x 10 bil  01/08/23 Therapeutic Exercise: to improve strength and mobility.  Demo, verbal and tactile cues throughout for technique. Nustep L5x71min UE/LE Squats 2x10 Standing row  with cable 15# 2x10 Standing shoulder ext 15# 2x10 Standing side bend x 10 each side Seated QL stretch x 30 sec bil  Manual Therapy: STM to L lumbar paraspinals and glutes/piriformis  01/03/23 Therapeutic Exercise: to improve strength and mobility.  Demo, verbal and tactile cues throughout for technique. Nustep L5 x 6 min  At counter for safety: Heel raises 2 x 10  Toe raises 2 x 10  Hip abduction x 10 r/l Hip extension x 10 r/l Hip flexion x 10 r/l Squats 2 x 10 Standing rows x 10 RTB Paloff press RTB x  10 each side  12/30/22 Therapeutic Exercise: to improve strength and mobility.  Demo, verbal and tactile cues throughout for technique.  Bike L2x47min Supine pelvic tilts 2 x 10  Supine figure 4 stretch 2 x 30 sec bil Bridges x 10 with TrA  SLR x 10 bil  Seated hip ABD RTB 10x3" Manual: STM to L lumbar paraspinals and glutes/piriformis  12/25/22 EVAL only     PATIENT EDUCATION:  Education details: HEP update Person educated: Patient Education method: Explanation, Demonstration, Verbal cues, and Handouts Education comprehension: verbalized understanding and returned demonstration  HOME EXERCISE PROGRAM: Access Code: [email or web address] URL: [email or web address] Date: 01/03/2023 Prepared by: Harrie Foreman  Exercises - Supine Posterior Pelvic Tilt  -  1 x daily - 7 x weekly - 3 sets - 10 reps - Supine Figure 4 Piriformis Stretch  - 2 x daily - 7 x weekly - 3 sets - 3 reps - Supine Bridge  - 1 x daily - 7 x weekly - 2 sets - 10 reps - Heel Raises with Counter Support  - 1 x daily - 7 x weekly - 2 sets - 10 reps - Toe Raises with Counter Support  - 1 x daily - 7 x weekly - 2 sets - 10 reps - Standing Hip Extension with Counter Support  - 1 x daily - 7 x weekly - 2-3 sets - 10 reps - Standing Hip Abduction with Counter Support  - 1 x daily - 7 x weekly - 2-3 sets - 10 reps - Standing Hip Flexion with Counter Support  - 1 x daily - 7 x weekly - 2-3 sets - 10 reps - Mini Squat with Counter Support  - 1 x daily - 7 x weekly - 2 sets - 10 reps - Standing Single Leg Stance with Counter Support  - 1 x daily - 7 x weekly - 1 sets - 2 reps - 30 sec  hold - Standing Tandem Balance with Counter Support  - 1 x daily - 7 x weekly - 1 sets - 2 reps - 20 sec  hold - Standing Row with Anchored Resistance  - 1 x daily - 7 x weekly - 2 sets - 10 reps - Standing Anti-Rotation Press with Anchored Resistance  - 1 x daily - 7 x weekly - 2 sets - 10 reps  ASSESSMENT:  CLINICAL IMPRESSION: Pt completed interventions with no increase in LBP. He was sore coming in today so we mainly focused on gentle stretching and mobility exercises for the spine. He noted feeling the tension in his LB with the farmer walks today but no pain. Donetta Potts Dorner continues to demonstrate potential for improvement and would benefit from continued skilled therapy to address impairments.     OBJECTIVE IMPAIRMENTS: Abnormal gait, decreased activity tolerance, decreased balance, decreased endurance, decreased mobility, difficulty walking, decreased ROM, decreased strength, hypomobility, increased muscle spasms, and pain.   ACTIVITY LIMITATIONS: carrying, lifting, bending, sitting, standing, squatting, sleeping, and locomotion level  PARTICIPATION LIMITATIONS: meal prep, cleaning, laundry,  driving, shopping, and yard work  PERSONAL FACTORS: Age, Past/current experiences, Time since onset of injury/illness/exacerbation, and 3+ comorbidities: Sepsis secondary to UTI, Acute on chronic diastolic heart failure, Afib with RVR, HTN, dylipidemia, Acute kidney injury on CKD, history of CAD with CABG, TIA, h/o R knee arthroscopic surgery  are also affecting patient's functional outcome.   REHAB POTENTIAL: Good  CLINICAL DECISION MAKING: Evolving/moderate complexity  EVALUATION COMPLEXITY: Moderate   GOALS: Goals reviewed with patient? Yes  SHORT TERM GOALS: Target date: 01/15/2023   Patient will be independent with initial HEP.  Baseline: needs Goal status: MET  01/15/23   LONG TERM GOALS: Target date: 03/05/2023   Patient will be independent with advanced/ongoing HEP to improve outcomes and carryover.  Baseline:  Goal status: IN PROGRESS  2.  Patient will report 75% improvement in low back pain to improve QOL.  Baseline:  Goal status: IN PROGRESS- 01/13/23 pt reports 80-90% improvement  3.  Patient will demonstrate full pain free lumbar ROM to perform ADLs.   Baseline: see objective  Goal status: IN PROGRESS  4.  Patient will demonstrate improved functional strength as demonstrated by 4+/5 hip strength. Baseline: see objective Goal status: IN PROGRESS  5.  Patient will report at least 6 points improvement on modified Oswestry to demonstrate improved functional ability.  Baseline: 17/50 Goal status: IN PROGRESS   6.  Patient will demonstrate improved gait speed to 1.0 m/s for community navigation. Baseline: 0.69 m/s Goal status: IN PROGRESS  7.   Patient be able to maintain tandem stance > 35 sec each side to decrease risk of falls.   Baseline: : L foot forward 30 sec, R foot forward 13 sec, norm for age 21.49 seconds.  Goal status: IN PROGRESS  8. Patient will score >22/24 on DGI to decrease risk of falls.  Baseline: 19/24 Goal status: IN  PROGRESS   PLAN:  PT FREQUENCY: 1-2x/week  PT DURATION: 10 weeks  PLANNED INTERVENTIONS: 97164- PT Re-evaluation, 97110-Therapeutic exercises, 97530- Therapeutic activity, 97112- Neuromuscular re-education, 97535- Self Care, 16109- Manual therapy, [id number]- Gait training, [id number]- Orthotic Fit/training, 97014- Electrical stimulation (unattended), [id number]- Electrical stimulation (manual), [id number]- Ultrasound, 09811- Traction (mechanical), Patient/Family education, Balance training, Stair training, Taping, Dry Needling, Joint mobilization, Joint manipulation, Spinal manipulation, Spinal mobilization, Cryotherapy, and Moist heat.  PLAN FOR NEXT SESSION: HEP for hip/core strengthening, functional strengthening for back; manual therapy, modalities PRN.     Darleene Cleaver, PTA 01/15/2023, 2:22 PM

## 2023-01-20 ENCOUNTER — Encounter: Payer: Self-pay | Admitting: Physical Therapy

## 2023-01-20 ENCOUNTER — Ambulatory Visit: Payer: Medicare Other | Attending: Family Medicine | Admitting: Physical Therapy

## 2023-01-20 DIAGNOSIS — R2689 Other abnormalities of gait and mobility: Secondary | ICD-10-CM | POA: Insufficient documentation

## 2023-01-20 DIAGNOSIS — M6281 Muscle weakness (generalized): Secondary | ICD-10-CM | POA: Insufficient documentation

## 2023-01-20 DIAGNOSIS — M5459 Other low back pain: Secondary | ICD-10-CM | POA: Diagnosis not present

## 2023-01-20 NOTE — Therapy (Signed)
OUTPATIENT PHYSICAL THERAPY THORACOLUMBAR TREATMENT   Patient Name: Jeffery Barr MRN: [id number] DOB:02/04/1941, 82 y.o., male Today's Date: 01/20/2023  END OF SESSION:  PT End of Session - 01/20/23 1154     Visit Number 7    Date for PT Re-Evaluation 03/05/23    Authorization Type Medicare + [employer] supplement    Progress Note Due on Visit 10    PT Start Time 1151    PT Stop Time 1220    PT Time Calculation (min) 29 min    Activity Tolerance Patient tolerated treatment well    Behavior During Therapy [facility] for tasks assessed/performed                 Past Medical History:  Diagnosis Date   Acute hypoxic respiratory failure (HCC) 11/30/2022   Acute kidney injury superimposed on chronic kidney disease (HCC) 12/04/2022   Acute on chronic diastolic heart failure (HCC) 11/30/2022   Acute respiratory failure with hypoxia (HCC) 07/22/2021   Atrial fibrillation with RVR (HCC) 11/30/2022   Benign essential hypertension 08/27/2021   Benign hypertensive heart disease without heart failure    CAD (coronary artery disease) 11/26/2012   Chronic systolic CHF (congestive heart failure) (HCC) 07/22/2021   Coronary atherosclerosis of native coronary artery    S/P CABG   Dyslipidemia    Generalized weakness 07/22/2021   HTN (hypertension)    Hypoxic respiratory failure (HCC) 11/30/2022   NSTEMI (non-ST elevated myocardial infarction) (HCC) 11/30/2022   Obesity    Obesity (BMI 30-39.9) 11/26/2012   Obesity, class 1 12/04/2022   Other thrombophilia (HCC) 08/27/2021   Paroxysmal atrial fibrillation (HCC) 07/22/2021   Persistent atrial fibrillation (HCC) 08/27/2021   Pneumonia due to COVID-19 virus 07/21/2021   Pure hypercholesterolemia 08/27/2021   Sepsis secondary to UTI (HCC) 11/30/2022   Severe sepsis (HCC) 11/30/2022   Sinus bradycardia    a. baseline HR 50s.   TIA (transient ischemic attack) 05/30/2021   Transaminitis 07/22/2021   Past Surgical History:  Procedure  Laterality Date   CORONARY ARTERY BYPASS GRAFT     VASECTOMY     Patient Active Problem List   Diagnosis Date Noted   Obesity, class 1 12/04/2022   Acute kidney injury superimposed on chronic kidney disease (HCC) 12/04/2022   Sepsis secondary to UTI (HCC) 11/30/2022   NSTEMI (non-ST elevated myocardial infarction) (HCC) 11/30/2022   Severe sepsis (HCC) 11/30/2022   Hypoxic respiratory failure (HCC) 11/30/2022   Acute on chronic diastolic heart failure (HCC) 11/30/2022   Atrial fibrillation with RVR (HCC) 11/30/2022   Acute hypoxic respiratory failure (HCC) 11/30/2022   Persistent atrial fibrillation (HCC) 08/27/2021   Benign essential hypertension 08/27/2021   Other thrombophilia (HCC) 08/27/2021   Pure hypercholesterolemia 08/27/2021   Acute respiratory failure with hypoxia (HCC) 07/22/2021   Transaminitis 07/22/2021   Generalized weakness 07/22/2021   Paroxysmal atrial fibrillation (HCC) 07/22/2021   Chronic systolic CHF (congestive heart failure) (HCC) 07/22/2021   Pneumonia due to COVID-19 virus 07/21/2021   TIA (transient ischemic attack) 05/30/2021   CAD (coronary artery disease) 11/26/2012   HTN (hypertension) 11/26/2012   Obesity (BMI 30-39.9) 11/26/2012   Dyslipidemia 11/26/2012    PCP: [name], MD   REFERRING PROVIDER: Hughie Closs, MD   REFERRING DIAG: Unsteadiness on feet (R26.81);Other abnormalities of gait and mobility (R26.89);Pain Pain - part of body:  (back)   Rationale for Evaluation and Treatment: Rehabilitation  THERAPY DIAG:  Other low back pain  Other abnormalities of gait and mobility  Muscle weakness (generalized)  ONSET DATE: ED to hospital admission 11/29/2022, DC on 12/05/2022  SUBJECTIVE:                                                                                                                                                                                           SUBJECTIVE STATEMENT: Doing well today, no back pain  today.  Has to leave a little early.  Most difficulty is with balance.   PERTINENT HISTORY:  Sepsis secondary to UTI, Acute on chronic diastolic heart failure, Afib with RVR, HTN, dylipidemia, Acute kidney injury on CKD, history of CAD with CABG, TIA, h/o R knee arthroscopic surgery  PAIN:  Are you having pain? Yes: NPRS scale: 0/10 Pain location: L side low back/glute Pain description: dull Aggravating factors: walking distances, mornings it hurts Relieving factors: goes away after moving around a little  PRECAUTIONS: Fall  RED FLAGS: None   WEIGHT BEARING RESTRICTIONS: No  FALLS:  Has patient fallen in last 6 months? Yes. Number of falls 1 - at hospital  LIVING ENVIRONMENT: Lives with: lives with their spouse Lives in: House/apartment Stairs: No Has following equipment at home: None  OCCUPATION: retired  PLOF: Independent and Leisure: daily walks around the block ~ 15 min   PATIENT GOALS: get better  NEXT MD VISIT: had follow-up visit last week  OBJECTIVE:   DIAGNOSTIC FINDINGS:  11/29/2022 MRI Lumbar spine IMPRESSION: 1. Motion degraded exam. Additionally, Please note that this is a limited sagittal only MRI for screening of possible epidural abscess or other spinal infection. No axial images were performed. 2. No MRI evidence for acute infection within the cervical, thoracic, or lumbar spine. No epidural abscess. 3. Chronic bilateral pars defects at L5 with associated 6 mm spondylolisthesis, with resultant mild bilateral L5 foraminal stenosis. 4. Otherwise, no other significant disc pathology within the cervical, thoracic, or lumbar spine for age. No other stenosis or impingement.  PATIENT SURVEYS:  ABC scale 1100/1600; 68.8% Modified Oswestry 17/50   COGNITION: Overall cognitive status: Within functional limits for tasks assessed     SENSATION: WFL  MUSCLE LENGTH: Hamstrings: moderate tightness bil Thomas test: moderate tightness bil  POSTURE:  rounded shoulders, forward head, and decreased lumbar lordosis  PALPATION: Tenderness to PA mobs L2-4, L QL  LUMBAR ROM:   AROM eval  Flexion  To mid shin, pull L side low back  Extension WNL  Right lateral flexion To knee, pull L side  Left lateral flexion To knee  Right rotation WNL, pull L side   Left rotation WNL   (Blank rows = not tested)  LOWER EXTREMITY ROM:  Decreased L hip IR (20 deg) compared to R hip (30 deg), postive FABER on L (groin pain) but negative scour test.    LOWER EXTREMITY MMT:    MMT Right eval Left eval  Hip flexion 4 4+  Hip extension 3+ 3+  Hip abduction 5 5  Hip adduction 5 5  Knee flexion 4+ 4+  Knee extension 5 5  Ankle dorsiflexion 5 5  Ankle plantarflexion 5 5   (Blank rows = not tested)  LUMBAR SPECIAL TESTS:  Straight leg raise test: Negative  FUNCTIONAL TESTS:  5 times sit to stand: 10.66 seconds without UE assist.  Timed up and go (TUG): 9.76 sec Dynamic Gait Index: 19/24 MCTSIB: Condition 1: Avg of 3 trials: 30 sec, Condition 2: Avg of 3 trials: 30 sec, Condition 3: Avg of 3 trials: 30 sec, Condition 4: Avg of 3 trials: 30 sec, and Total Score: 120/120 Gait speed 0.69 m/s Tandem L foot 30 , R foot 13 sec   GAIT: Distance walked: 46' Assistive device utilized: None Level of assistance: Complete Independence Comments: wide BOS, decreased heel strike on L, occasional shuffling, forward lean, decreased gait speed,   TODAY'S TREATMENT:                                                                                                                              DATE:  01/20/23  Therapeutic Exercise: to improve strength and mobility.  Demo, verbal and tactile cues throughout for technique. Bike L2 x 6 min  Flexion stretch with ball in sitting - to decrease tightness low back after extension with stepping strategy exercise [employer] x 20  Neuromuscular Reeducation: to improve balance and stability. SBA for safety throughout.   Stepping strategy  - forward reach x 10 r/l  - side step and turn x 10 r/l  - back step and cross x 10 r/l - reported complaint LB tightness following due to extension Star excursion - forward, side back, starting with step, progressing to taps Tandem stance 2 x 30 sec each side  01/15/23  Therapeutic Exercise: to improve strength and mobility.  Demo, verbal and tactile cues throughout for technique. Rec Bike L2x23min Supine LTR each direction with orange pball x 10  Supine HS curl with orange pball 2x10 Supine SKTC 2x30" B Supine KTOS piriformis stretch 2x30" B Supine ball squeeze hip ADD 2x10 Farmer walks 10lb B - cues to keep arms to side 3x90 ft  01/08/23 Therapeutic Exercise: to improve strength and mobility.  Demo, verbal and tactile cues throughout for technique. Rec Bike L2x42min Seated row 20# 2x10 high grips Seated lat pulls- increased LBP Standing rows with cable 20# x 10 Farmer carry 10lb 2 laps 90 ft Standing side bend with 10lb weight x 10  Seated QL stretch 2x15" eahc way Standing HS curls x 10 bil Standing hip extension x 10 bil  PATIENT EDUCATION:  Education details: HEP update Person educated: Patient Education method:  Explanation, Demonstration, Verbal cues, and Handouts Education comprehension: verbalized understanding and returned demonstration  HOME EXERCISE PROGRAM: Access Code: [email or web address] URL: [email or web address] Date: 01/03/2023 Prepared by: Harrie Foreman  Exercises - Supine Posterior Pelvic Tilt  - 1 x daily - 7 x weekly - 3 sets - 10 reps - Supine Figure 4 Piriformis Stretch  - 2 x daily - 7 x weekly - 3 sets - 3 reps - Supine Bridge  - 1 x daily - 7 x weekly - 2 sets - 10 reps - Heel Raises with Counter Support  - 1 x daily - 7 x weekly - 2 sets - 10 reps - Toe Raises with Counter Support  - 1 x daily - 7 x weekly - 2 sets - 10 reps - Standing Hip Extension with Counter Support  - 1 x daily - 7 x weekly - 2-3 sets - 10  reps - Standing Hip Abduction with Counter Support  - 1 x daily - 7 x weekly - 2-3 sets - 10 reps - Standing Hip Flexion with Counter Support  - 1 x daily - 7 x weekly - 2-3 sets - 10 reps - Mini Squat with Counter Support  - 1 x daily - 7 x weekly - 2 sets - 10 reps - Standing Single Leg Stance with Counter Support  - 1 x daily - 7 x weekly - 1 sets - 2 reps - 30 sec  hold - Standing Tandem Balance with Counter Support  - 1 x daily - 7 x weekly - 1 sets - 2 reps - 20 sec  hold - Standing Row with Anchored Resistance  - 1 x daily - 7 x weekly - 2 sets - 10 reps - Standing Anti-Rotation Press with Anchored Resistance  - 1 x daily - 7 x weekly - 2 sets - 10 reps  ASSESSMENT:  CLINICAL IMPRESSION: Pt. Reports no back pain today, doing well with HEP although reports its a lot, so no HEP update today.  Today focused on balance exercises, some minor LOB, needing just minA to stabilize or able to recover on own.  Some report of tightness in LB after stepping back with crossing arms due to increased lumbar extension, relieved with flexion stretch with ball. Donetta Potts Mort continues to demonstrate potential for improvement and would benefit from continued skilled therapy to address impairments.     OBJECTIVE IMPAIRMENTS: Abnormal gait, decreased activity tolerance, decreased balance, decreased endurance, decreased mobility, difficulty walking, decreased ROM, decreased strength, hypomobility, increased muscle spasms, and pain.   ACTIVITY LIMITATIONS: carrying, lifting, bending, sitting, standing, squatting, sleeping, and locomotion level  PARTICIPATION LIMITATIONS: meal prep, cleaning, laundry, driving, shopping, and yard work  PERSONAL FACTORS: Age, Past/current experiences, Time since onset of injury/illness/exacerbation, and 3+ comorbidities: Sepsis secondary to UTI, Acute on chronic diastolic heart failure, Afib with RVR, HTN, dylipidemia, Acute kidney injury on CKD, history of CAD with CABG, TIA,  h/o R knee arthroscopic surgery  are also affecting patient's functional outcome.   REHAB POTENTIAL: Good  CLINICAL DECISION MAKING: Evolving/moderate complexity  EVALUATION COMPLEXITY: Moderate   GOALS: Goals reviewed with patient? Yes  SHORT TERM GOALS: Target date: 01/15/2023   Patient will be independent with initial HEP.  Baseline: needs Goal status: MET  01/15/23   LONG TERM GOALS: Target date: 03/05/2023   Patient will be independent with advanced/ongoing HEP to improve outcomes and carryover.  Baseline:  Goal status: IN PROGRESS  2.  Patient will report 75% improvement in low back  pain to improve QOL.  Baseline:  Goal status: IN PROGRESS- 01/13/23 pt reports 80-90% improvement  3.  Patient will demonstrate full pain free lumbar ROM to perform ADLs.   Baseline: see objective  Goal status: IN PROGRESS  4.  Patient will demonstrate improved functional strength as demonstrated by 4+/5 hip strength. Baseline: see objective Goal status: IN PROGRESS  5.  Patient will report at least 6 points improvement on modified Oswestry to demonstrate improved functional ability.  Baseline: 17/50 Goal status: IN PROGRESS   6.  Patient will demonstrate improved gait speed to 1.0 m/s for community navigation. Baseline: 0.69 m/s Goal status: IN PROGRESS  7.   Patient be able to maintain tandem stance > 35 sec each side to decrease risk of falls.   Baseline: : L foot forward 30 sec, R foot forward 13 sec, norm for age 38.49 seconds.  Goal status: IN PROGRESS  01/20/23- L foot forward 40 sec, R foot 24 second.   8. Patient will score >22/24 on DGI to decrease risk of falls.  Baseline: 19/24 Goal status: IN PROGRESS   PLAN:  PT FREQUENCY: 1-2x/week  PT DURATION: 10 weeks  PLANNED INTERVENTIONS: 97164- PT Re-evaluation, 97110-Therapeutic exercises, 97530- Therapeutic activity, 97112- Neuromuscular re-education, 97535- Self Care, 52841- Manual therapy, [id number]- Gait training,  [id number]- Orthotic Fit/training, 97014- Electrical stimulation (unattended), [id number]- Electrical stimulation (manual), [id number]- Ultrasound, 10272- Traction (mechanical), Patient/Family education, Balance training, Stair training, Taping, Dry Needling, Joint mobilization, Joint manipulation, Spinal manipulation, Spinal mobilization, Cryotherapy, and Moist heat.  PLAN FOR NEXT SESSION: HEP for hip/core strengthening, functional strengthening for back; manual therapy, modalities PRN.  Balance training.    Jena Gauss, PT 01/20/2023, 12:29 PM

## 2023-01-21 ENCOUNTER — Other Ambulatory Visit: Payer: Self-pay

## 2023-01-21 ENCOUNTER — Telehealth: Payer: Self-pay | Admitting: Cardiology

## 2023-01-21 MED ORDER — CARVEDILOL 25 MG PO TABS: 25.0000 mg | ORAL_TABLET | Freq: Two times a day (BID) | ORAL | 3 refills | Status: DC

## 2023-01-21 NOTE — Telephone Encounter (Signed)
Pt c/o medication issue:  1. Name of Medication:   apixaban (ELIQUIS) 5 MG TABS tablet (Expired)    2. How are you currently taking this medication (dosage and times per day)? Take 1 tablet (5 mg total) by mouth 2 (two) times daily.   3. Are you having a reaction (difficulty breathing--STAT)? No   4. What is your medication issue? Pt's wife states she needs to speak with a nurse about the above medication. Please advise

## 2023-01-21 NOTE — Telephone Encounter (Signed)
Spoke with spouse who needed information on Eliquis foundation. Advised to call the number provided. Sent new Rx for Carvedilol 25 Bid to [facility] per Dr. Vanetta Shawl note. Spouse verbalized understanding and had no further questions.

## 2023-01-23 ENCOUNTER — Ambulatory Visit: Payer: Medicare Other | Admitting: Physical Therapy

## 2023-01-23 ENCOUNTER — Encounter: Payer: Self-pay | Admitting: Physical Therapy

## 2023-01-23 DIAGNOSIS — R2689 Other abnormalities of gait and mobility: Secondary | ICD-10-CM | POA: Diagnosis not present

## 2023-01-23 DIAGNOSIS — M6281 Muscle weakness (generalized): Secondary | ICD-10-CM

## 2023-01-23 DIAGNOSIS — M5459 Other low back pain: Secondary | ICD-10-CM | POA: Diagnosis not present

## 2023-01-23 NOTE — Therapy (Signed)
OUTPATIENT PHYSICAL THERAPY THORACOLUMBAR TREATMENT   Patient Name: Jeffery Barr MRN: [id number] DOB:1940/10/19, 82 y.o., male Today's Date: 01/23/2023  END OF SESSION:  PT End of Session - 01/23/23 1209     Visit Number 8    Date for PT Re-Evaluation 03/05/23    Authorization Type Medicare + [employer] supplement    Progress Note Due on Visit 10    PT Start Time 1207    PT Stop Time 1231    PT Time Calculation (min) 24 min    Activity Tolerance Patient tolerated treatment well    Behavior During Therapy [facility] for tasks assessed/performed                 Past Medical History:  Diagnosis Date   Acute hypoxic respiratory failure (HCC) 11/30/2022   Acute kidney injury superimposed on chronic kidney disease (HCC) 12/04/2022   Acute on chronic diastolic heart failure (HCC) 11/30/2022   Acute respiratory failure with hypoxia (HCC) 07/22/2021   Atrial fibrillation with RVR (HCC) 11/30/2022   Benign essential hypertension 08/27/2021   Benign hypertensive heart disease without heart failure    CAD (coronary artery disease) 11/26/2012   Chronic systolic CHF (congestive heart failure) (HCC) 07/22/2021   Coronary atherosclerosis of native coronary artery    S/P CABG   Dyslipidemia    Generalized weakness 07/22/2021   HTN (hypertension)    Hypoxic respiratory failure (HCC) 11/30/2022   NSTEMI (non-ST elevated myocardial infarction) (HCC) 11/30/2022   Obesity    Obesity (BMI 30-39.9) 11/26/2012   Obesity, class 1 12/04/2022   Other thrombophilia (HCC) 08/27/2021   Paroxysmal atrial fibrillation (HCC) 07/22/2021   Persistent atrial fibrillation (HCC) 08/27/2021   Pneumonia due to COVID-19 virus 07/21/2021   Pure hypercholesterolemia 08/27/2021   Sepsis secondary to UTI (HCC) 11/30/2022   Severe sepsis (HCC) 11/30/2022   Sinus bradycardia    a. baseline HR 50s.   TIA (transient ischemic attack) 05/30/2021   Transaminitis 07/22/2021   Past Surgical History:  Procedure  Laterality Date   CORONARY ARTERY BYPASS GRAFT     VASECTOMY     Patient Active Problem List   Diagnosis Date Noted   Obesity, class 1 12/04/2022   Acute kidney injury superimposed on chronic kidney disease (HCC) 12/04/2022   Sepsis secondary to UTI (HCC) 11/30/2022   NSTEMI (non-ST elevated myocardial infarction) (HCC) 11/30/2022   Severe sepsis (HCC) 11/30/2022   Hypoxic respiratory failure (HCC) 11/30/2022   Acute on chronic diastolic heart failure (HCC) 11/30/2022   Atrial fibrillation with RVR (HCC) 11/30/2022   Acute hypoxic respiratory failure (HCC) 11/30/2022   Persistent atrial fibrillation (HCC) 08/27/2021   Benign essential hypertension 08/27/2021   Other thrombophilia (HCC) 08/27/2021   Pure hypercholesterolemia 08/27/2021   Acute respiratory failure with hypoxia (HCC) 07/22/2021   Transaminitis 07/22/2021   Generalized weakness 07/22/2021   Paroxysmal atrial fibrillation (HCC) 07/22/2021   Chronic systolic CHF (congestive heart failure) (HCC) 07/22/2021   Pneumonia due to COVID-19 virus 07/21/2021   TIA (transient ischemic attack) 05/30/2021   CAD (coronary artery disease) 11/26/2012   HTN (hypertension) 11/26/2012   Obesity (BMI 30-39.9) 11/26/2012   Dyslipidemia 11/26/2012    PCP: [name], MD   REFERRING PROVIDER: [name], MD   REFERRING DIAG: Unsteadiness on feet (R26.81);Other abnormalities of gait and mobility (R26.89);Pain Pain - part of body:  (back)   Rationale for Evaluation and Treatment: Rehabilitation  THERAPY DIAG:  Other low back pain  Other abnormalities of gait and mobility  Muscle weakness (generalized)  ONSET DATE: ED to hospital admission 11/29/2022, DC on 12/05/2022  SUBJECTIVE:                                                                                                                                                                                           SUBJECTIVE STATEMENT: Back is doing well, despite  hanging Christmas lights earlier.  Arrived late.   PERTINENT HISTORY:  Sepsis secondary to UTI, Acute on chronic diastolic heart failure, Afib with RVR, HTN, dylipidemia, Acute kidney injury on CKD, history of CAD with CABG, TIA, h/o R knee arthroscopic surgery  PAIN:  Are you having pain? Yes: NPRS scale: 2/10 Pain location: L side low back/glute Pain description: dull Aggravating factors: walking distances, mornings it hurts Relieving factors: goes away after moving around a little  PRECAUTIONS: Fall  RED FLAGS: None   WEIGHT BEARING RESTRICTIONS: No  FALLS:  Has patient fallen in last 6 months? Yes. Number of falls 1 - at hospital  LIVING ENVIRONMENT: Lives with: lives with their spouse Lives in: House/apartment Stairs: No Has following equipment at home: None  OCCUPATION: retired  PLOF: Independent and Leisure: daily walks around the block ~ 15 min   PATIENT GOALS: get better  NEXT MD VISIT: had follow-up visit last week  OBJECTIVE:   DIAGNOSTIC FINDINGS:  11/29/2022 MRI Lumbar spine IMPRESSION: 1. Motion degraded exam. Additionally, Please note that this is a limited sagittal only MRI for screening of possible epidural abscess or other spinal infection. No axial images were performed. 2. No MRI evidence for acute infection within the cervical, thoracic, or lumbar spine. No epidural abscess. 3. Chronic bilateral pars defects at L5 with associated 6 mm spondylolisthesis, with resultant mild bilateral L5 foraminal stenosis. 4. Otherwise, no other significant disc pathology within the cervical, thoracic, or lumbar spine for age. No other stenosis or impingement.  PATIENT SURVEYS:  ABC scale 1100/1600; 68.8% Modified Oswestry 17/50   COGNITION: Overall cognitive status: Within functional limits for tasks assessed     SENSATION: WFL  MUSCLE LENGTH: Hamstrings: moderate tightness bil Thomas test: moderate tightness bil  POSTURE: rounded shoulders,  forward head, and decreased lumbar lordosis  PALPATION: Tenderness to PA mobs L2-4, L QL  LUMBAR ROM:   AROM eval  Flexion  To mid shin, pull L side low back  Extension WNL  Right lateral flexion To knee, pull L side  Left lateral flexion To knee  Right rotation WNL, pull L side   Left rotation WNL   (Blank rows = not tested)  LOWER EXTREMITY ROM:   Decreased L hip IR (20 deg) compared to  R hip (30 deg), postive FABER on L (groin pain) but negative scour test.    LOWER EXTREMITY MMT:    MMT Right eval Left eval  Hip flexion 4 4+  Hip extension 3+ 3+  Hip abduction 5 5  Hip adduction 5 5  Knee flexion 4+ 4+  Knee extension 5 5  Ankle dorsiflexion 5 5  Ankle plantarflexion 5 5   (Blank rows = not tested)  LUMBAR SPECIAL TESTS:  Straight leg raise test: Negative  FUNCTIONAL TESTS:  5 times sit to stand: 10.66 seconds without UE assist.  Timed up and go (TUG): 9.76 sec Dynamic Gait Index: 19/24 MCTSIB: Condition 1: Avg of 3 trials: 30 sec, Condition 2: Avg of 3 trials: 30 sec, Condition 3: Avg of 3 trials: 30 sec, Condition 4: Avg of 3 trials: 30 sec, and Total Score: 120/120 Gait speed 0.69 m/s Tandem L foot 30 , R foot 13 sec   GAIT: Distance walked: 28' Assistive device utilized: None Level of assistance: Complete Independence Comments: wide BOS, decreased heel strike on L, occasional shuffling, forward lean, decreased gait speed,   TODAY'S TREATMENT:                                                                                                                              DATE:  01/23/23 Neuromuscular Reeducation: to improve balance and stability. SBA for safety throughout.  Bike L2 x 5 min for warm-up/tissue perfusion  Star excursion pattern forward, side and back each side x 5 Cone taps x 5 each side Cone tip and return x 5 each side  Side stepping over cones 2 x 5 each side - tended to step around cones, so changed to stepping over rollers, no  difficulty stepping over but difficulty with judging distance, stepping on rollers, minA to steady Step and reach diagonals to foam rollers with cones on top - x 10 each side outside BOS   01/20/23  Therapeutic Exercise: to improve strength and mobility.  Demo, verbal and tactile cues throughout for technique. Bike L2 x 6 min  Flexion stretch with ball in sitting - to decrease tightness low back after extension with stepping strategy exercise [employer] x 20  Neuromuscular Reeducation: to improve balance and stability. SBA for safety throughout.  Stepping strategy  - forward reach x 10 r/l  - side step and turn x 10 r/l  - back step and cross x 10 r/l - reported complaint LB tightness following due to extension Star excursion - forward, side back, starting with step, progressing to taps Tandem stance 2 x 30 sec each side  01/15/23  Therapeutic Exercise: to improve strength and mobility.  Demo, verbal and tactile cues throughout for technique. Rec Bike L2x82min Supine LTR each direction with orange pball x 10  Supine HS curl with orange pball 2x10 Supine SKTC 2x30" B Supine KTOS piriformis stretch 2x30" B Supine ball squeeze hip ADD 2x10 Farmer walks 10lb  B - cues to keep arms to side 3x90 ft  01/08/23 Therapeutic Exercise: to improve strength and mobility.  Demo, verbal and tactile cues throughout for technique. Rec Bike L2x82min Seated row 20# 2x10 high grips Seated lat pulls- increased LBP Standing rows with cable 20# x 10 Farmer carry 10lb 2 laps 90 ft Standing side bend with 10lb weight x 10  Seated QL stretch 2x15" eahc way Standing HS curls x 10 bil Standing hip extension x 10 bil  PATIENT EDUCATION:  Education details: HEP update Person educated: Patient Education method: Explanation, Demonstration, Verbal cues, and Handouts Education comprehension: verbalized understanding and returned demonstration  HOME EXERCISE PROGRAM: Access Code: [email or web address] URL:  [email or web address] Date: 01/03/2023 Prepared by: Harrie Foreman  Exercises - Supine Posterior Pelvic Tilt  - 1 x daily - 7 x weekly - 3 sets - 10 reps - Supine Figure 4 Piriformis Stretch  - 2 x daily - 7 x weekly - 3 sets - 3 reps - Supine Bridge  - 1 x daily - 7 x weekly - 2 sets - 10 reps - Heel Raises with Counter Support  - 1 x daily - 7 x weekly - 2 sets - 10 reps - Toe Raises with Counter Support  - 1 x daily - 7 x weekly - 2 sets - 10 reps - Standing Hip Extension with Counter Support  - 1 x daily - 7 x weekly - 2-3 sets - 10 reps - Standing Hip Abduction with Counter Support  - 1 x daily - 7 x weekly - 2-3 sets - 10 reps - Standing Hip Flexion with Counter Support  - 1 x daily - 7 x weekly - 2-3 sets - 10 reps - Mini Squat with Counter Support  - 1 x daily - 7 x weekly - 2 sets - 10 reps - Standing Single Leg Stance with Counter Support  - 1 x daily - 7 x weekly - 1 sets - 2 reps - 30 sec  hold - Standing Tandem Balance with Counter Support  - 1 x daily - 7 x weekly - 1 sets - 2 reps - 20 sec  hold - Standing Row with Anchored Resistance  - 1 x daily - 7 x weekly - 2 sets - 10 reps - Standing Anti-Rotation Press with Anchored Resistance  - 1 x daily - 7 x weekly - 2 sets - 10 reps  ASSESSMENT:  CLINICAL IMPRESSION: Continued to focus on balance, with more challenging balance task reaching further outside BOS and cone taps today to encourage maintaining SLS for longer periods.  Some minor LOB but able to correct balance on own. Jeffery Barr continues to demonstrate potential for improvement and would benefit from continued skilled therapy to address impairments.     OBJECTIVE IMPAIRMENTS: Abnormal gait, decreased activity tolerance, decreased balance, decreased endurance, decreased mobility, difficulty walking, decreased ROM, decreased strength, hypomobility, increased muscle spasms, and pain.   ACTIVITY LIMITATIONS: carrying, lifting, bending, sitting,  standing, squatting, sleeping, and locomotion level  PARTICIPATION LIMITATIONS: meal prep, cleaning, laundry, driving, shopping, and yard work  PERSONAL FACTORS: Age, Past/current experiences, Time since onset of injury/illness/exacerbation, and 3+ comorbidities: Sepsis secondary to UTI, Acute on chronic diastolic heart failure, Afib with RVR, HTN, dylipidemia, Acute kidney injury on CKD, history of CAD with CABG, TIA, h/o R knee arthroscopic surgery  are also affecting patient's functional outcome.   REHAB POTENTIAL: Good  CLINICAL DECISION MAKING: Evolving/moderate complexity  EVALUATION COMPLEXITY: Moderate   GOALS: Goals  reviewed with patient? Yes  SHORT TERM GOALS: Target date: 01/15/2023   Patient will be independent with initial HEP.  Baseline: needs Goal status: MET  01/15/23   LONG TERM GOALS: Target date: 03/05/2023   Patient will be independent with advanced/ongoing HEP to improve outcomes and carryover.  Baseline:  Goal status: IN PROGRESS  2.  Patient will report 75% improvement in low back pain to improve QOL.  Baseline:  Goal status: IN PROGRESS- 01/13/23 pt reports 80-90% improvement  3.  Patient will demonstrate full pain free lumbar ROM to perform ADLs.   Baseline: see objective  Goal status: IN PROGRESS  4.  Patient will demonstrate improved functional strength as demonstrated by 4+/5 hip strength. Baseline: see objective Goal status: IN PROGRESS  5.  Patient will report at least 6 points improvement on modified Oswestry to demonstrate improved functional ability.  Baseline: 17/50 Goal status: IN PROGRESS   6.  Patient will demonstrate improved gait speed to 1.0 m/s for community navigation. Baseline: 0.69 m/s Goal status: IN PROGRESS  7.   Patient be able to maintain tandem stance > 35 sec each side to decrease risk of falls.   Baseline: : L foot forward 30 sec, R foot forward 13 sec, norm for age 62.49 seconds.  Goal status: IN PROGRESS  01/20/23-  L foot forward 40 sec, R foot 24 second.   8. Patient will score >22/24 on DGI to decrease risk of falls.  Baseline: 19/24 Goal status: IN PROGRESS   PLAN:  PT FREQUENCY: 1-2x/week  PT DURATION: 10 weeks  PLANNED INTERVENTIONS: 97164- PT Re-evaluation, 97110-Therapeutic exercises, 97530- Therapeutic activity, 97112- Neuromuscular re-education, 97535- Self Care, 40347- Manual therapy, [id number]- Gait training, [phone]- Orthotic Fit/training, 97014- Electrical stimulation (unattended), [id number]- Electrical stimulation (manual), [id number]- Ultrasound, [id number]- Traction (mechanical), Patient/Family education, Balance training, Stair training, Taping, Dry Needling, Joint mobilization, Joint manipulation, Spinal manipulation, Spinal mobilization, Cryotherapy, and Moist heat.  PLAN FOR NEXT SESSION: HEP for hip/core strengthening, functional strengthening for back; manual therapy, modalities PRN.  Balance training.    Jena Gauss, PT 01/23/2023, 1:27 PM

## 2023-01-27 ENCOUNTER — Ambulatory Visit: Payer: Medicare Other

## 2023-01-27 DIAGNOSIS — M5459 Other low back pain: Secondary | ICD-10-CM | POA: Diagnosis not present

## 2023-01-27 DIAGNOSIS — M6281 Muscle weakness (generalized): Secondary | ICD-10-CM

## 2023-01-27 DIAGNOSIS — R2689 Other abnormalities of gait and mobility: Secondary | ICD-10-CM | POA: Diagnosis not present

## 2023-01-27 NOTE — Therapy (Signed)
OUTPATIENT PHYSICAL THERAPY THORACOLUMBAR TREATMENT   Patient Name: Jeffery Barr MRN: [id number] DOB:[birth year], 82 y.o., male Today's Date: 01/27/2023  END OF SESSION:  PT End of Session - 01/27/23 1151     Visit Number 9    Date for PT Re-Evaluation 03/05/23    Authorization Type Medicare + [employer] supplement    Progress Note Due on Visit 10    PT Start Time 1147    PT Stop Time 1231    PT Time Calculation (min) 44 min    Activity Tolerance Patient tolerated treatment well    Behavior During Therapy [facility] for tasks assessed/performed                  Past Medical History:  Diagnosis Date   Acute hypoxic respiratory failure (HCC) 11/30/2022   Acute kidney injury superimposed on chronic kidney disease (HCC) 12/04/2022   Acute on chronic diastolic heart failure (HCC) 11/30/2022   Acute respiratory failure with hypoxia (HCC) 07/22/2021   Atrial fibrillation with RVR (HCC) 11/30/2022   Benign essential hypertension 08/27/2021   Benign hypertensive heart disease without heart failure    CAD (coronary artery disease) 11/26/2012   Chronic systolic CHF (congestive heart failure) (HCC) 07/22/2021   Coronary atherosclerosis of native coronary artery    S/P CABG   Dyslipidemia    Generalized weakness 07/22/2021   HTN (hypertension)    Hypoxic respiratory failure (HCC) 11/30/2022   NSTEMI (non-ST elevated myocardial infarction) (HCC) 11/30/2022   Obesity    Obesity (BMI 30-39.9) 11/26/2012   Obesity, class 1 12/04/2022   Other thrombophilia (HCC) 08/27/2021   Paroxysmal atrial fibrillation (HCC) 07/22/2021   Persistent atrial fibrillation (HCC) 08/27/2021   Pneumonia due to COVID-19 virus 07/21/2021   Pure hypercholesterolemia 08/27/2021   Sepsis secondary to UTI (HCC) 11/30/2022   Severe sepsis (HCC) 11/30/2022   Sinus bradycardia    a. baseline HR 50s.   TIA (transient ischemic attack) 05/30/2021   Transaminitis 07/22/2021   Past Surgical History:  Procedure  Laterality Date   CORONARY ARTERY BYPASS GRAFT     VASECTOMY     Patient Active Problem List   Diagnosis Date Noted   Obesity, class 1 12/04/2022   Acute kidney injury superimposed on chronic kidney disease (HCC) 12/04/2022   Sepsis secondary to UTI (HCC) 11/30/2022   NSTEMI (non-ST elevated myocardial infarction) (HCC) 11/30/2022   Severe sepsis (HCC) 11/30/2022   Hypoxic respiratory failure (HCC) 11/30/2022   Acute on chronic diastolic heart failure (HCC) 11/30/2022   Atrial fibrillation with RVR (HCC) 11/30/2022   Acute hypoxic respiratory failure (HCC) 11/30/2022   Persistent atrial fibrillation (HCC) 08/27/2021   Benign essential hypertension 08/27/2021   Other thrombophilia (HCC) 08/27/2021   Pure hypercholesterolemia 08/27/2021   Acute respiratory failure with hypoxia (HCC) 07/22/2021   Transaminitis 07/22/2021   Generalized weakness 07/22/2021   Paroxysmal atrial fibrillation (HCC) 07/22/2021   Chronic systolic CHF (congestive heart failure) (HCC) 07/22/2021   Pneumonia due to COVID-19 virus 07/21/2021   TIA (transient ischemic attack) 05/30/2021   CAD (coronary artery disease) 11/26/2012   HTN (hypertension) 11/26/2012   Obesity (BMI 30-39.9) 11/26/2012   Dyslipidemia 11/26/2012    PCP: [name], MD   REFERRING PROVIDER: Hughie Closs, MD   REFERRING DIAG: Unsteadiness on feet (R26.81);Other abnormalities of gait and mobility (R26.89);Pain Pain - part of body:  (back)   Rationale for Evaluation and Treatment: Rehabilitation  THERAPY DIAG:  Other low back pain  Other abnormalities of gait and  mobility  Muscle weakness (generalized)  ONSET DATE: ED to hospital admission 11/29/2022, DC on 12/05/2022  SUBJECTIVE:                                                                                                                                                                                           SUBJECTIVE STATEMENT: Pt reports he has the most pain when  he is moving but then again it's mild.  PERTINENT HISTORY:  Sepsis secondary to UTI, Acute on chronic diastolic heart failure, Afib with RVR, HTN, dylipidemia, Acute kidney injury on CKD, history of CAD with CABG, TIA, h/o R knee arthroscopic surgery  PAIN:  Are you having pain? Yes: NPRS scale: 2 when moving/10 Pain location: L side low back/glute Pain description: dull Aggravating factors: walking distances, mornings it hurts Relieving factors: goes away after moving around a little  PRECAUTIONS: Fall  RED FLAGS: None   WEIGHT BEARING RESTRICTIONS: No  FALLS:  Has patient fallen in last 6 months? Yes. Number of falls 1 - at hospital  LIVING ENVIRONMENT: Lives with: lives with their spouse Lives in: House/apartment Stairs: No Has following equipment at home: None  OCCUPATION: retired  PLOF: Independent and Leisure: daily walks around the block ~ 15 min   PATIENT GOALS: get better  NEXT MD VISIT: had follow-up visit last week  OBJECTIVE:   DIAGNOSTIC FINDINGS:  11/29/2022 MRI Lumbar spine IMPRESSION: 1. Motion degraded exam. Additionally, Please note that this is a limited sagittal only MRI for screening of possible epidural abscess or other spinal infection. No axial images were performed. 2. No MRI evidence for acute infection within the cervical, thoracic, or lumbar spine. No epidural abscess. 3. Chronic bilateral pars defects at L5 with associated 6 mm spondylolisthesis, with resultant mild bilateral L5 foraminal stenosis. 4. Otherwise, no other significant disc pathology within the cervical, thoracic, or lumbar spine for age. No other stenosis or impingement.  PATIENT SURVEYS:  ABC scale 1100/1600; 68.8% Modified Oswestry 17/50   COGNITION: Overall cognitive status: Within functional limits for tasks assessed     SENSATION: WFL  MUSCLE LENGTH: Hamstrings: moderate tightness bil Thomas test: moderate tightness bil  POSTURE: rounded shoulders,  forward head, and decreased lumbar lordosis  PALPATION: Tenderness to PA mobs L2-4, L QL  LUMBAR ROM:   AROM eval  Flexion  To mid shin, pull L side low back  Extension WNL  Right lateral flexion To knee, pull L side  Left lateral flexion To knee  Right rotation WNL, pull L side   Left rotation WNL   (Blank rows = not tested)  LOWER EXTREMITY ROM:   Decreased  L hip IR (20 deg) compared to R hip (30 deg), postive FABER on L (groin pain) but negative scour test.    LOWER EXTREMITY MMT:    MMT Right eval Left eval  Hip flexion 4 4+  Hip extension 3+ 3+  Hip abduction 5 5  Hip adduction 5 5  Knee flexion 4+ 4+  Knee extension 5 5  Ankle dorsiflexion 5 5  Ankle plantarflexion 5 5   (Blank rows = not tested)  LUMBAR SPECIAL TESTS:  Straight leg raise test: Negative  FUNCTIONAL TESTS:  5 times sit to stand: 10.66 seconds without UE assist.  Timed up and go (TUG): 9.76 sec Dynamic Gait Index: 19/24 MCTSIB: Condition 1: Avg of 3 trials: 30 sec, Condition 2: Avg of 3 trials: 30 sec, Condition 3: Avg of 3 trials: 30 sec, Condition 4: Avg of 3 trials: 30 sec, and Total Score: 120/120 Gait speed 0.69 m/s Tandem L foot 30 , R foot 13 sec   GAIT: Distance walked: 33' Assistive device utilized: None Level of assistance: Complete Independence Comments: wide BOS, decreased heel strike on L, occasional shuffling, forward lean, decreased gait speed,   TODAY'S TREATMENT:                                                                                                                              DATE: 01/27/23 Bike L2x47min Neuromuscular Reeducation: to improve balance and stability. SBA for safety throughout.  Golfers lifts x 10 B with 1HA Cone taps 2 x 8 each side Fwd step and reach x 10 B- cues for proper step length Step downs from airex pad 2x10 Marching from airex x 10 intermittent UE support   Therapeutic Exercise: to improve strength and mobility.  Demo, verbal and  tactile cues throughout for technique. Bike L2x63min Seated 3 way 2x30" each green pball  01/23/23 Neuromuscular Reeducation: to improve balance and stability. SBA for safety throughout.  Bike L2 x 5 min for warm-up/tissue perfusion  Star excursion pattern forward, side and back each side x 5 Cone taps x 5 each side Cone tip and return x 5 each side  Side stepping over cones 2 x 5 each side - tended to step around cones, so changed to stepping over rollers, no difficulty stepping over but difficulty with judging distance, stepping on rollers, minA to steady Step and reach diagonals to foam rollers with cones on top - x 10 each side outside BOS   01/20/23  Therapeutic Exercise: to improve strength and mobility.  Demo, verbal and tactile cues throughout for technique. Bike L2 x 6 min  Flexion stretch with ball in sitting - to decrease tightness low back after extension with stepping strategy exercise [employer] x 20  Neuromuscular Reeducation: to improve balance and stability. SBA for safety throughout.  Stepping strategy  - forward reach x 10 r/l  - side step and turn x 10 r/l  - back step and cross x  10 r/l - reported complaint LB tightness following due to extension Star excursion - forward, side back, starting with step, progressing to taps Tandem stance 2 x 30 sec each side  01/15/23  Therapeutic Exercise: to improve strength and mobility.  Demo, verbal and tactile cues throughout for technique. Rec Bike L2x31min Supine LTR each direction with orange pball x 10  Supine HS curl with orange pball 2x10 Supine SKTC 2x30" B Supine KTOS piriformis stretch 2x30" B Supine ball squeeze hip ADD 2x10 Farmer walks 10lb B - cues to keep arms to side 3x90 ft  01/08/23 Therapeutic Exercise: to improve strength and mobility.  Demo, verbal and tactile cues throughout for technique. Rec Bike L2x68min Seated row 20# 2x10 high grips Seated lat pulls- increased LBP Standing rows with cable 20# x  10 Farmer carry 10lb 2 laps 90 ft Standing side bend with 10lb weight x 10  Seated QL stretch 2x15" eahc way Standing HS curls x 10 bil Standing hip extension x 10 bil  PATIENT EDUCATION:  Education details: HEP update Person educated: Patient Education method: Explanation, Demonstration, Verbal cues, and Handouts Education comprehension: verbalized understanding and returned demonstration  HOME EXERCISE PROGRAM: Access Code: [email or web address] URL: [email or web address] Date: 01/03/2023 Prepared by: Harrie Foreman  Exercises - Supine Posterior Pelvic Tilt  - 1 x daily - 7 x weekly - 3 sets - 10 reps - Supine Figure 4 Piriformis Stretch  - 2 x daily - 7 x weekly - 3 sets - 3 reps - Supine Bridge  - 1 x daily - 7 x weekly - 2 sets - 10 reps - Heel Raises with Counter Support  - 1 x daily - 7 x weekly - 2 sets - 10 reps - Toe Raises with Counter Support  - 1 x daily - 7 x weekly - 2 sets - 10 reps - Standing Hip Extension with Counter Support  - 1 x daily - 7 x weekly - 2-3 sets - 10 reps - Standing Hip Abduction with Counter Support  - 1 x daily - 7 x weekly - 2-3 sets - 10 reps - Standing Hip Flexion with Counter Support  - 1 x daily - 7 x weekly - 2-3 sets - 10 reps - Mini Squat with Counter Support  - 1 x daily - 7 x weekly - 2 sets - 10 reps - Standing Single Leg Stance with Counter Support  - 1 x daily - 7 x weekly - 1 sets - 2 reps - 30 sec  hold - Standing Tandem Balance with Counter Support  - 1 x daily - 7 x weekly - 1 sets - 2 reps - 20 sec  hold - Standing Row with Anchored Resistance  - 1 x daily - 7 x weekly - 2 sets - 10 reps - Standing Anti-Rotation Press with Anchored Resistance  - 1 x daily - 7 x weekly - 2 sets - 10 reps  ASSESSMENT:  CLINICAL IMPRESSION: Continued to focus on balance, with more challenging balance activities, adding foam surface as well as progressing cone taps and functional movements. Pt more challenged with SLS phase of cone taps and  step downs from airex. Min A required to maintain balance with some interventions. Donetta Potts [place] continues to demonstrate potential for improvement and would benefit from continued skilled therapy to address impairments.     OBJECTIVE IMPAIRMENTS: Abnormal gait, decreased activity tolerance, decreased balance, decreased endurance, decreased mobility, difficulty walking, decreased ROM, decreased strength, hypomobility, increased muscle spasms, and  pain.   ACTIVITY LIMITATIONS: carrying, lifting, bending, sitting, standing, squatting, sleeping, and locomotion level  PARTICIPATION LIMITATIONS: meal prep, cleaning, laundry, driving, shopping, and yard work  PERSONAL FACTORS: Age, Past/current experiences, Time since onset of injury/illness/exacerbation, and 3+ comorbidities: Sepsis secondary to UTI, Acute on chronic diastolic heart failure, Afib with RVR, HTN, dylipidemia, Acute kidney injury on CKD, history of CAD with CABG, TIA, h/o R knee arthroscopic surgery  are also affecting patient's functional outcome.   REHAB POTENTIAL: Good  CLINICAL DECISION MAKING: Evolving/moderate complexity  EVALUATION COMPLEXITY: Moderate   GOALS: Goals reviewed with patient? Yes  SHORT TERM GOALS: Target date: 01/15/2023   Patient will be independent with initial HEP.  Baseline: needs Goal status: MET  01/15/23   LONG TERM GOALS: Target date: 03/05/2023   Patient will be independent with advanced/ongoing HEP to improve outcomes and carryover.  Baseline:  Goal status: IN PROGRESS  2.  Patient will report 75% improvement in low back pain to improve QOL.  Baseline:  Goal status: IN PROGRESS- 01/13/23 pt reports 80-90% improvement  3.  Patient will demonstrate full pain free lumbar ROM to perform ADLs.   Baseline: see objective  Goal status: IN PROGRESS  4.  Patient will demonstrate improved functional strength as demonstrated by 4+/5 hip strength. Baseline: see objective Goal status: IN  PROGRESS  5.  Patient will report at least 6 points improvement on modified Oswestry to demonstrate improved functional ability.  Baseline: 17/50 Goal status: IN PROGRESS   6.  Patient will demonstrate improved gait speed to 1.0 m/s for community navigation. Baseline: 0.69 m/s Goal status: IN PROGRESS  7.   Patient be able to maintain tandem stance > 35 sec each side to decrease risk of falls.   Baseline: : L foot forward 30 sec, R foot forward 13 sec, norm for age 2.49 seconds.  Goal status: IN PROGRESS  01/20/23- L foot forward 40 sec, R foot 24 second.   8. Patient will score >22/24 on DGI to decrease risk of falls.  Baseline: 19/24 Goal status: IN PROGRESS   PLAN:  PT FREQUENCY: 1-2x/week  PT DURATION: 10 weeks  PLANNED INTERVENTIONS: 97164- PT Re-evaluation, 97110-Therapeutic exercises, 97530- Therapeutic activity, 97112- Neuromuscular re-education, 97535- Self Care, 16109- Manual therapy, [id number]- Gait training, [phone]- Orthotic Fit/training, 97014- Electrical stimulation (unattended), [id number]- Electrical stimulation (manual), [id number]- Ultrasound, 09811- Traction (mechanical), Patient/Family education, Balance training, Stair training, Taping, Dry Needling, Joint mobilization, Joint manipulation, Spinal manipulation, Spinal mobilization, Cryotherapy, and Moist heat.  PLAN FOR NEXT SESSION: 10th visit PN; HEP for hip/core strengthening, functional strengthening for back; manual therapy, modalities PRN.  Balance training.    Darleene Cleaver, PTA 01/27/2023, 12:45 PM

## 2023-01-30 ENCOUNTER — Encounter: Payer: Self-pay | Admitting: Physical Therapy

## 2023-01-30 ENCOUNTER — Ambulatory Visit: Payer: Medicare Other | Admitting: Physical Therapy

## 2023-01-30 DIAGNOSIS — M5459 Other low back pain: Secondary | ICD-10-CM

## 2023-01-30 DIAGNOSIS — R2689 Other abnormalities of gait and mobility: Secondary | ICD-10-CM | POA: Diagnosis not present

## 2023-01-30 DIAGNOSIS — M6281 Muscle weakness (generalized): Secondary | ICD-10-CM

## 2023-01-30 NOTE — Therapy (Signed)
OUTPATIENT PHYSICAL THERAPY THORACOLUMBAR TREATMENT/PROGRESS NOTE   Patient Name: Jeffery Barr MRN: [id number] DOB:05-Dec-1940, 82 y.o., male Today's Date: 01/30/2023  Progress Note Reporting Period 12/25/22 to 01/30/23  See note below for Objective Data and Assessment of Progress/Goals.      END OF SESSION:  PT End of Session - 01/30/23 1214     Visit Number 10    Date for PT Re-Evaluation 03/05/23    Authorization Type Medicare + [employer] supplement    Progress Note Due on Visit 20    PT Start Time 1150    PT Stop Time 1229    PT Time Calculation (min) 39 min    Activity Tolerance Patient tolerated treatment well    Behavior During Therapy [facility] for tasks assessed/performed                   Past Medical History:  Diagnosis Date   Acute hypoxic respiratory failure (HCC) 11/30/2022   Acute kidney injury superimposed on chronic kidney disease (HCC) 12/04/2022   Acute on chronic diastolic heart failure (HCC) 11/30/2022   Acute respiratory failure with hypoxia (HCC) 07/22/2021   Atrial fibrillation with RVR (HCC) 11/30/2022   Benign essential hypertension 08/27/2021   Benign hypertensive heart disease without heart failure    CAD (coronary artery disease) 11/26/2012   Chronic systolic CHF (congestive heart failure) (HCC) 07/22/2021   Coronary atherosclerosis of native coronary artery    S/P CABG   Dyslipidemia    Generalized weakness 07/22/2021   HTN (hypertension)    Hypoxic respiratory failure (HCC) 11/30/2022   NSTEMI (non-ST elevated myocardial infarction) (HCC) 11/30/2022   Obesity    Obesity (BMI 30-39.9) 11/26/2012   Obesity, class 1 12/04/2022   Other thrombophilia (HCC) 08/27/2021   Paroxysmal atrial fibrillation (HCC) 07/22/2021   Persistent atrial fibrillation (HCC) 08/27/2021   Pneumonia due to COVID-19 virus 07/21/2021   Pure hypercholesterolemia 08/27/2021   Sepsis secondary to UTI (HCC) 11/30/2022   Severe sepsis (HCC) 11/30/2022   Sinus  bradycardia    a. baseline HR 50s.   TIA (transient ischemic attack) 05/30/2021   Transaminitis 07/22/2021   Past Surgical History:  Procedure Laterality Date   CORONARY ARTERY BYPASS GRAFT     VASECTOMY     Patient Active Problem List   Diagnosis Date Noted   Obesity, class 1 12/04/2022   Acute kidney injury superimposed on chronic kidney disease (HCC) 12/04/2022   Sepsis secondary to UTI (HCC) 11/30/2022   NSTEMI (non-ST elevated myocardial infarction) (HCC) 11/30/2022   Severe sepsis (HCC) 11/30/2022   Hypoxic respiratory failure (HCC) 11/30/2022   Acute on chronic diastolic heart failure (HCC) 11/30/2022   Atrial fibrillation with RVR (HCC) 11/30/2022   Acute hypoxic respiratory failure (HCC) 11/30/2022   Persistent atrial fibrillation (HCC) 08/27/2021   Benign essential hypertension 08/27/2021   Other thrombophilia (HCC) 08/27/2021   Pure hypercholesterolemia 08/27/2021   Acute respiratory failure with hypoxia (HCC) 07/22/2021   Transaminitis 07/22/2021   Generalized weakness 07/22/2021   Paroxysmal atrial fibrillation (HCC) 07/22/2021   Chronic systolic CHF (congestive heart failure) (HCC) 07/22/2021   Pneumonia due to COVID-19 virus 07/21/2021   TIA (transient ischemic attack) 05/30/2021   CAD (coronary artery disease) 11/26/2012   HTN (hypertension) 11/26/2012   Obesity (BMI 30-39.9) 11/26/2012   Dyslipidemia 11/26/2012    PCP: [name], MD   REFERRING PROVIDER: Hughie Closs, MD   REFERRING DIAG: Unsteadiness on feet (R26.81);Other abnormalities of gait and mobility (R26.89);Pain Pain - part of  body:  (back)   Rationale for Evaluation and Treatment: Rehabilitation  THERAPY DIAG:  Other low back pain  Other abnormalities of gait and mobility  Muscle weakness (generalized)  ONSET DATE: ED to hospital admission 11/29/2022, DC on 12/05/2022  SUBJECTIVE:                                                                                                                                                                                            SUBJECTIVE STATEMENT:  Feel like I'm getting better, walking and back pain are getting better, but at the same time back can still stiffen up quickly. No falls since the one in the hospital.   PERTINENT HISTORY:  Sepsis secondary to UTI, Acute on chronic diastolic heart failure, Afib with RVR, HTN, dylipidemia, Acute kidney injury on CKD, history of CAD with CABG, TIA, h/o R knee arthroscopic surgery  PAIN:  Are you having pain? Yes: NPRS scale: 0 now, present in sitting but goes away quickly/no number given/10 Pain location: L side low back/glute Pain description: sharp pain for a few seconds  Aggravating factors: sitting too long, transfer from sit to stand Relieving factors: goes away after moving around a little  PRECAUTIONS: Fall  RED FLAGS: None   WEIGHT BEARING RESTRICTIONS: No  FALLS:  Has patient fallen in last 6 months? Yes. Number of falls 1 - at hospital  LIVING ENVIRONMENT: Lives with: lives with their spouse Lives in: House/apartment Stairs: No Has following equipment at home: None  OCCUPATION: retired  PLOF: Independent and Leisure: daily walks around the block ~ 15 min   PATIENT GOALS: get better  NEXT MD VISIT: had follow-up visit last week  OBJECTIVE:   DIAGNOSTIC FINDINGS:  11/29/2022 MRI Lumbar spine IMPRESSION: 1. Motion degraded exam. Additionally, Please note that this is a limited sagittal only MRI for screening of possible epidural abscess or other spinal infection. No axial images were performed. 2. No MRI evidence for acute infection within the cervical, thoracic, or lumbar spine. No epidural abscess. 3. Chronic bilateral pars defects at L5 with associated 6 mm spondylolisthesis, with resultant mild bilateral L5 foraminal stenosis. 4. Otherwise, no other significant disc pathology within the cervical, thoracic, or lumbar spine for age. No other  stenosis or impingement.  PATIENT SURVEYS:  ABC scale 1100/1600; 68.8% Modified Oswestry 17/50; 01/30/23- 3/50   COGNITION: Overall cognitive status: Within functional limits for tasks assessed     SENSATION: WFL  MUSCLE LENGTH: Hamstrings: moderate tightness bil Thomas test: moderate tightness bil  POSTURE: rounded shoulders, forward head, and decreased lumbar lordosis  PALPATION: Tenderness to PA mobs L2-4, L QL  LUMBAR ROM:   AROM eval 01/30/23  Flexion  To mid shin, pull L side low back Mild limitation   Extension WNL WNL   Right lateral flexion To knee, pull L side WNL   Left lateral flexion To knee WNL   Right rotation WNL, pull L side    Left rotation WNL    (Blank rows = not tested)  LOWER EXTREMITY ROM:   Decreased L hip IR (20 deg) compared to R hip (30 deg), postive FABER on L (groin pain) but negative scour test.    LOWER EXTREMITY MMT:    MMT Right eval Left eval Right 01/30/23 Left 01/30/23  Hip flexion 4 4+ 4 4  Hip extension 3+ 3+ 3- 3-  Hip abduction 5 5 5 5   Hip adduction 5 5    Knee flexion 4+ 4+ 5 5  Knee extension 5 5 5 5   Ankle dorsiflexion 5 5    Ankle plantarflexion 5 5     (Blank rows = not tested)  LUMBAR SPECIAL TESTS:  Straight leg raise test: Negative  FUNCTIONAL TESTS:  5 times sit to stand: 10.66 seconds without UE assist.  Timed up and go (TUG): 9.76 sec Dynamic Gait Index: 19/24; 01/30/23- 23/24 MCTSIB: Condition 1: Avg of 3 trials: 30 sec, Condition 2: Avg of 3 trials: 30 sec, Condition 3: Avg of 3 trials: 30 sec, Condition 4: Avg of 3 trials: 30 sec, and Total Score: 120/120 Gait speed 0.69 m/s Tandem L foot 30 , R foot 13 sec    GAIT: Distance walked: 14' Assistive device utilized: None Level of assistance: Complete Independence Comments: wide BOS, decreased heel strike on L, occasional shuffling, forward lean, decreased gait speed,   TODAY'S TREATMENT:                                                                                                                               DATE:  01/30/23  Modified Oswestry, objective measures for progress note as above, education on goals/progress overall and POC moving forward, role of core and hip strength as well as wt loss on back pain  TherEx  Bridges x10 TA sets 15x3 second holds Staggered bridges x10 B  TA set + march x12  Tandem stance solid surface 2x30 seconds B Narrow BOS EC solid surface 2x30 seconds B      01/27/23 Bike L2x73min Neuromuscular Reeducation: to improve balance and stability. SBA for safety throughout.  Golfers lifts x 10 B with 1HA Cone taps 2 x 8 each side Fwd step and reach x 10 B- cues for proper step length Step downs from airex pad 2x10 Marching from airex x 10 intermittent UE support   Therapeutic Exercise: to improve strength and mobility.  Demo, verbal and tactile cues throughout for technique. Bike L2x58min Seated 3 way 2x30" each green pball  01/23/23 Neuromuscular Reeducation: to improve balance and stability. SBA for safety throughout.  Bike L2 x 5  min for warm-up/tissue perfusion  Star excursion pattern forward, side and back each side x 5 Cone taps x 5 each side Cone tip and return x 5 each side  Side stepping over cones 2 x 5 each side - tended to step around cones, so changed to stepping over rollers, no difficulty stepping over but difficulty with judging distance, stepping on rollers, minA to steady Step and reach diagonals to foam rollers with cones on top - x 10 each side outside BOS   01/20/23  Therapeutic Exercise: to improve strength and mobility.  Demo, verbal and tactile cues throughout for technique. Bike L2 x 6 min  Flexion stretch with ball in sitting - to decrease tightness low back after extension with stepping strategy exercise [employer] x 20  Neuromuscular Reeducation: to improve balance and stability. SBA for safety throughout.  Stepping strategy  - forward reach x 10 r/l  - side  step and turn x 10 r/l  - back step and cross x 10 r/l - reported complaint LB tightness following due to extension Star excursion - forward, side back, starting with step, progressing to taps Tandem stance 2 x 30 sec each side  01/15/23  Therapeutic Exercise: to improve strength and mobility.  Demo, verbal and tactile cues throughout for technique. Rec Bike L2x87min Supine LTR each direction with orange pball x 10  Supine HS curl with orange pball 2x10 Supine SKTC 2x30" B Supine KTOS piriformis stretch 2x30" B Supine ball squeeze hip ADD 2x10 Jeffery walks 10lb B - cues to keep arms to side 3x90 ft  01/08/23 Therapeutic Exercise: to improve strength and mobility.  Demo, verbal and tactile cues throughout for technique. Rec Bike L2x35min Seated row 20# 2x10 high grips Seated lat pulls- increased LBP Standing rows with cable 20# x 10 Jeffery carry 10lb 2 laps 90 ft Standing side bend with 10lb weight x 10  Seated QL stretch 2x15" eahc way Standing HS curls x 10 bil Standing hip extension x 10 bil  PATIENT EDUCATION:  Education details: HEP update Person educated: Patient Education method: Explanation, Demonstration, Verbal cues, and Handouts Education comprehension: verbalized understanding and returned demonstration  HOME EXERCISE PROGRAM: Access Code: [email or web address] URL: [email or web address] Date: 01/03/2023 Prepared by: Harrie Foreman  Exercises - Supine Posterior Pelvic Tilt  - 1 x daily - 7 x weekly - 3 sets - 10 reps - Supine Figure 4 Piriformis Stretch  - 2 x daily - 7 x weekly - 3 sets - 3 reps - Supine Bridge  - 1 x daily - 7 x weekly - 2 sets - 10 reps - Heel Raises with Counter Support  - 1 x daily - 7 x weekly - 2 sets - 10 reps - Toe Raises with Counter Support  - 1 x daily - 7 x weekly - 2 sets - 10 reps - Standing Hip Extension with Counter Support  - 1 x daily - 7 x weekly - 2-3 sets - 10 reps - Standing Hip Abduction with Counter Support  - 1 x  daily - 7 x weekly - 2-3 sets - 10 reps - Standing Hip Flexion with Counter Support  - 1 x daily - 7 x weekly - 2-3 sets - 10 reps - Mini Squat with Counter Support  - 1 x daily - 7 x weekly - 2 sets - 10 reps - Standing Single Leg Stance with Counter Support  - 1 x daily - 7 x weekly - 1 sets - 2 reps -  30 sec  hold - Standing Tandem Balance with Counter Support  - 1 x daily - 7 x weekly - 1 sets - 2 reps - 20 sec  hold - Standing Row with Anchored Resistance  - 1 x daily - 7 x weekly - 2 sets - 10 reps - Standing Anti-Rotation Press with Anchored Resistance  - 1 x daily - 7 x weekly - 2 sets - 10 reps  ASSESSMENT:  CLINICAL IMPRESSION:  Pt arrives today doing well, primary focus of session was getting objective measures for progress note today, also worked on core strengthening/hip extensor strength as well as balance training. Does seem to be making some progress towards his goals but would definitely benefit from continuation of skilled PT services as per POC.     OBJECTIVE IMPAIRMENTS: Abnormal gait, decreased activity tolerance, decreased balance, decreased endurance, decreased mobility, difficulty walking, decreased ROM, decreased strength, hypomobility, increased muscle spasms, and pain.   ACTIVITY LIMITATIONS: carrying, lifting, bending, sitting, standing, squatting, sleeping, and locomotion level  PARTICIPATION LIMITATIONS: meal prep, cleaning, laundry, driving, shopping, and yard work  PERSONAL FACTORS: Age, Past/current experiences, Time since onset of injury/illness/exacerbation, and 3+ comorbidities: Sepsis secondary to UTI, Acute on chronic diastolic heart failure, Afib with RVR, HTN, dylipidemia, Acute kidney injury on CKD, history of CAD with CABG, TIA, h/o R knee arthroscopic surgery  are also affecting patient's functional outcome.   REHAB POTENTIAL: Good  CLINICAL DECISION MAKING: Evolving/moderate complexity  EVALUATION COMPLEXITY: Moderate   GOALS: Goals reviewed  with patient? Yes  SHORT TERM GOALS: Target date: 01/15/2023   Patient will be independent with initial HEP.  Baseline: needs Goal status: MET  01/15/23   LONG TERM GOALS: Target date: 03/05/2023   Patient will be independent with advanced/ongoing HEP to improve outcomes and carryover.  Baseline:  Goal status: IN PROGRESS 01/30/23  2.  Patient will report 75% improvement in low back pain to improve QOL.  Baseline:  Goal status: IN PROGRESS- 01/30/23 50% improvement   3.  Patient will demonstrate full pain free lumbar ROM to perform ADLs.   Baseline: see objective  Goal status: MET 01/30/23  4.  Patient will demonstrate improved functional strength as demonstrated by 4+/5 hip strength. Baseline: see objective Goal status: IN PROGRESS 01/30/23  5.  Patient will report at least 6 points improvement on modified Oswestry to demonstrate improved functional ability.  Baseline: 17/50 Goal status: IN PROGRESS   6.  Patient will demonstrate improved gait speed to 1.0 m/s for community navigation. Baseline: 0.69 m/s Goal status: IN PROGRESS 01/30/23  7.   Patient be able to maintain tandem stance > 35 sec each side to decrease risk of falls.   Baseline: : L foot forward 30 sec, R foot forward 13 sec, norm for age 52.49 seconds.  Goal status: IN PROGRESS  01/30/23  8. Patient will score >22/24 on DGI to decrease risk of falls.  Baseline: 19/24 Goal status: MET 01/30/23    PLAN:  PT FREQUENCY: 1-2x/week  PT DURATION: 10 weeks  PLANNED INTERVENTIONS: 97164- PT Re-evaluation, 97110-Therapeutic exercises, 97530- Therapeutic activity, 97112- Neuromuscular re-education, 97535- Self Care, [id number]- Manual therapy, [phone]- Gait training, [phone]- Orthotic Fit/training, 97014- Electrical stimulation (unattended), [id number]- Electrical stimulation (manual), [id number]- Ultrasound, 64403- Traction (mechanical), Patient/Family education, Balance training, Stair training, Taping, Dry Needling, Joint  mobilization, Joint manipulation, Spinal manipulation, Spinal mobilization, Cryotherapy, and Moist heat.  PLAN FOR NEXT SESSION: HEP for hip/core strengthening, functional strengthening for back; manual therapy, modalities PRN.  Balance  training.   Nedra Hai, PT, DPT 01/30/23 1:53 PM

## 2023-02-03 ENCOUNTER — Ambulatory Visit: Payer: Medicare Other

## 2023-02-03 DIAGNOSIS — R2689 Other abnormalities of gait and mobility: Secondary | ICD-10-CM

## 2023-02-03 DIAGNOSIS — M6281 Muscle weakness (generalized): Secondary | ICD-10-CM | POA: Diagnosis not present

## 2023-02-03 DIAGNOSIS — M5459 Other low back pain: Secondary | ICD-10-CM | POA: Diagnosis not present

## 2023-02-03 NOTE — Therapy (Signed)
OUTPATIENT PHYSICAL THERAPY THORACOLUMBAR TREATMENT   Patient Name: CLAXTON LYU MRN: [id number] DOB:Jul 02, 1940, 82 y.o., male Today's Date: 02/03/2023    END OF SESSION:  PT End of Session - 02/03/23 1227     Visit Number 11    Date for PT Re-Evaluation 03/05/23    Authorization Type Medicare + [employer] supplement    Progress Note Due on Visit 20    PT Start Time 1146    PT Stop Time 1227    PT Time Calculation (min) 41 min    Activity Tolerance Patient tolerated treatment well    Behavior During Therapy [facility] for tasks assessed/performed                    Past Medical History:  Diagnosis Date   Acute hypoxic respiratory failure (HCC) 11/30/2022   Acute kidney injury superimposed on chronic kidney disease (HCC) 12/04/2022   Acute on chronic diastolic heart failure (HCC) 11/30/2022   Acute respiratory failure with hypoxia (HCC) 07/22/2021   Atrial fibrillation with RVR (HCC) 11/30/2022   Benign essential hypertension 08/27/2021   Benign hypertensive heart disease without heart failure    CAD (coronary artery disease) 11/26/2012   Chronic systolic CHF (congestive heart failure) (HCC) 07/22/2021   Coronary atherosclerosis of native coronary artery    S/P CABG   Dyslipidemia    Generalized weakness 07/22/2021   HTN (hypertension)    Hypoxic respiratory failure (HCC) 11/30/2022   NSTEMI (non-ST elevated myocardial infarction) (HCC) 11/30/2022   Obesity    Obesity (BMI 30-39.9) 11/26/2012   Obesity, class 1 12/04/2022   Other thrombophilia (HCC) 08/27/2021   Paroxysmal atrial fibrillation (HCC) 07/22/2021   Persistent atrial fibrillation (HCC) 08/27/2021   Pneumonia due to COVID-19 virus 07/21/2021   Pure hypercholesterolemia 08/27/2021   Sepsis secondary to UTI (HCC) 11/30/2022   Severe sepsis (HCC) 11/30/2022   Sinus bradycardia    a. baseline HR 50s.   TIA (transient ischemic attack) 05/30/2021   Transaminitis 07/22/2021   Past Surgical History:   Procedure Laterality Date   CORONARY ARTERY BYPASS GRAFT     VASECTOMY     Patient Active Problem List   Diagnosis Date Noted   Obesity, class 1 12/04/2022   Acute kidney injury superimposed on chronic kidney disease (HCC) 12/04/2022   Sepsis secondary to UTI (HCC) 11/30/2022   NSTEMI (non-ST elevated myocardial infarction) (HCC) 11/30/2022   Severe sepsis (HCC) 11/30/2022   Hypoxic respiratory failure (HCC) 11/30/2022   Acute on chronic diastolic heart failure (HCC) 11/30/2022   Atrial fibrillation with RVR (HCC) 11/30/2022   Acute hypoxic respiratory failure (HCC) 11/30/2022   Persistent atrial fibrillation (HCC) 08/27/2021   Benign essential hypertension 08/27/2021   Other thrombophilia (HCC) 08/27/2021   Pure hypercholesterolemia 08/27/2021   Acute respiratory failure with hypoxia (HCC) 07/22/2021   Transaminitis 07/22/2021   Generalized weakness 07/22/2021   Paroxysmal atrial fibrillation (HCC) 07/22/2021   Chronic systolic CHF (congestive heart failure) (HCC) 07/22/2021   Pneumonia due to COVID-19 virus 07/21/2021   TIA (transient ischemic attack) 05/30/2021   CAD (coronary artery disease) 11/26/2012   HTN (hypertension) 11/26/2012   Obesity (BMI 30-39.9) 11/26/2012   Dyslipidemia 11/26/2012    PCP: [name], MD   REFERRING PROVIDER: Hughie Closs, MD   REFERRING DIAG: Unsteadiness on feet (R26.81);Other abnormalities of gait and mobility (R26.89);Pain Pain - part of body:  (back)   Rationale for Evaluation and Treatment: Rehabilitation  THERAPY DIAG:  Other low back pain  Other  abnormalities of gait and mobility  Muscle weakness (generalized)  ONSET DATE: ED to hospital admission 11/29/2022, DC on 12/05/2022  SUBJECTIVE:                                                                                                                                                                                           SUBJECTIVE STATEMENT: Pt just reporting  stiffness.  PERTINENT HISTORY:  Sepsis secondary to UTI, Acute on chronic diastolic heart failure, Afib with RVR, HTN, dylipidemia, Acute kidney injury on CKD, history of CAD with CABG, TIA, h/o R knee arthroscopic surgery  PAIN:  Are you having pain? Yes: NPRS scale: 0 now, present in sitting but goes away quickly/no number given/10 Pain location: L side low back/glute Pain description: sharp pain for a few seconds  Aggravating factors: sitting too long, transfer from sit to stand Relieving factors: goes away after moving around a little  PRECAUTIONS: Fall  RED FLAGS: None   WEIGHT BEARING RESTRICTIONS: No  FALLS:  Has patient fallen in last 6 months? Yes. Number of falls 1 - at hospital  LIVING ENVIRONMENT: Lives with: lives with their spouse Lives in: House/apartment Stairs: No Has following equipment at home: None  OCCUPATION: retired  PLOF: Independent and Leisure: daily walks around the block ~ 15 min   PATIENT GOALS: get better  NEXT MD VISIT: had follow-up visit last week  OBJECTIVE:   DIAGNOSTIC FINDINGS:  11/29/2022 MRI Lumbar spine IMPRESSION: 1. Motion degraded exam. Additionally, Please note that this is a limited sagittal only MRI for screening of possible epidural abscess or other spinal infection. No axial images were performed. 2. No MRI evidence for acute infection within the cervical, thoracic, or lumbar spine. No epidural abscess. 3. Chronic bilateral pars defects at L5 with associated 6 mm spondylolisthesis, with resultant mild bilateral L5 foraminal stenosis. 4. Otherwise, no other significant disc pathology within the cervical, thoracic, or lumbar spine for age. No other stenosis or impingement.  PATIENT SURVEYS:  ABC scale 1100/1600; 68.8% Modified Oswestry 17/50; 01/30/23- 3/50   COGNITION: Overall cognitive status: Within functional limits for tasks assessed     SENSATION: WFL  MUSCLE LENGTH: Hamstrings: moderate tightness  bil Thomas test: moderate tightness bil  POSTURE: rounded shoulders, forward head, and decreased lumbar lordosis  PALPATION: Tenderness to PA mobs L2-4, L QL  LUMBAR ROM:   AROM eval 01/30/23  Flexion  To mid shin, pull L side low back Mild limitation   Extension WNL WNL   Right lateral flexion To knee, pull L side WNL   Left lateral flexion To knee WNL   Right rotation WNL, pull  L side    Left rotation WNL    (Blank rows = not tested)  LOWER EXTREMITY ROM:   Decreased L hip IR (20 deg) compared to R hip (30 deg), postive FABER on L (groin pain) but negative scour test.    LOWER EXTREMITY MMT:    MMT Right eval Left eval Right 01/30/23 Left 01/30/23  Hip flexion 4 4+ 4 4  Hip extension 3+ 3+ 3- 3-  Hip abduction 5 5 5 5   Hip adduction 5 5    Knee flexion 4+ 4+ 5 5  Knee extension 5 5 5 5   Ankle dorsiflexion 5 5    Ankle plantarflexion 5 5     (Blank rows = not tested)  LUMBAR SPECIAL TESTS:  Straight leg raise test: Negative  FUNCTIONAL TESTS:  5 times sit to stand: 10.66 seconds without UE assist.  Timed up and go (TUG): 9.76 sec Dynamic Gait Index: 19/24; 01/30/23- 23/24 MCTSIB: Condition 1: Avg of 3 trials: 30 sec, Condition 2: Avg of 3 trials: 30 sec, Condition 3: Avg of 3 trials: 30 sec, Condition 4: Avg of 3 trials: 30 sec, and Total Score: 120/120 Gait speed 0.69 m/s Tandem L foot 30 , R foot 13 sec    GAIT: Distance walked: 21' Assistive device utilized: None Level of assistance: Complete Independence Comments: wide BOS, decreased heel strike on L, occasional shuffling, forward lean, decreased gait speed,   TODAY'S TREATMENT:                                                                                                                              DATE: 02/03/23 Rec Bike L2x62min Wall slides into shld flexion with staggered stance 2x10 Lat pull downs standing 20lb 2x15 Seated rows 25lb 2x12 low grips Trunk rotation blue TB x 10  LTR supine w/  orange pball x 15 each way HS curls orange pball x 10 - pt reports crepitus in lumbar spine Open books x 10 B IASTM foam roll to bil lumbar paraspinals  01/30/23  Modified Oswestry, objective measures for progress note as above, education on goals/progress overall and POC moving forward, role of core and hip strength as well as wt loss on back pain  TherEx  Bridges x10 TA sets 15x3 second holds Staggered bridges x10 B  TA set + march x12  Tandem stance solid surface 2x30 seconds B Narrow BOS EC solid surface 2x30 seconds B      01/27/23 Bike L2x20min Neuromuscular Reeducation: to improve balance and stability. SBA for safety throughout.  Golfers lifts x 10 B with 1HA Cone taps 2 x 8 each side Fwd step and reach x 10 B- cues for proper step length Step downs from airex pad 2x10 Marching from airex x 10 intermittent UE support   Therapeutic Exercise: to improve strength and mobility.  Demo, verbal and tactile cues throughout for technique. Bike L2x42min Seated 3 way 2x30" each green pball  01/23/23  Neuromuscular Reeducation: to improve balance and stability. SBA for safety throughout.  Bike L2 x 5 min for warm-up/tissue perfusion  Star excursion pattern forward, side and back each side x 5 Cone taps x 5 each side Cone tip and return x 5 each side  Side stepping over cones 2 x 5 each side - tended to step around cones, so changed to stepping over rollers, no difficulty stepping over but difficulty with judging distance, stepping on rollers, minA to steady Step and reach diagonals to foam rollers with cones on top - x 10 each side outside BOS   01/20/23  Therapeutic Exercise: to improve strength and mobility.  Demo, verbal and tactile cues throughout for technique. Bike L2 x 6 min  Flexion stretch with ball in sitting - to decrease tightness low back after extension with stepping strategy exercise [employer] x 20  Neuromuscular Reeducation: to improve balance and stability.  SBA for safety throughout.  Stepping strategy  - forward reach x 10 r/l  - side step and turn x 10 r/l  - back step and cross x 10 r/l - reported complaint LB tightness following due to extension Star excursion - forward, side back, starting with step, progressing to taps Tandem stance 2 x 30 sec each side  01/15/23  Therapeutic Exercise: to improve strength and mobility.  Demo, verbal and tactile cues throughout for technique. Rec Bike L2x82min Supine LTR each direction with orange pball x 10  Supine HS curl with orange pball 2x10 Supine SKTC 2x30" B Supine KTOS piriformis stretch 2x30" B Supine ball squeeze hip ADD 2x10 Farmer walks 10lb B - cues to keep arms to side 3x90 ft  01/08/23 Therapeutic Exercise: to improve strength and mobility.  Demo, verbal and tactile cues throughout for technique. Rec Bike L2x35min Seated row 20# 2x10 high grips Seated lat pulls- increased LBP Standing rows with cable 20# x 10 Farmer carry 10lb 2 laps 90 ft Standing side bend with 10lb weight x 10  Seated QL stretch 2x15" eahc way Standing HS curls x 10 bil Standing hip extension x 10 bil  PATIENT EDUCATION:  Education details: HEP update Person educated: Patient Education method: Explanation, Demonstration, Verbal cues, and Handouts Education comprehension: verbalized understanding and returned demonstration  HOME EXERCISE PROGRAM: Access Code: [email or web address] URL: [email or web address] Date: 01/03/2023 Prepared by: Harrie Foreman  Exercises - Supine Posterior Pelvic Tilt  - 1 x daily - 7 x weekly - 3 sets - 10 reps - Supine Figure 4 Piriformis Stretch  - 2 x daily - 7 x weekly - 3 sets - 3 reps - Supine Bridge  - 1 x daily - 7 x weekly - 2 sets - 10 reps - Heel Raises with Counter Support  - 1 x daily - 7 x weekly - 2 sets - 10 reps - Toe Raises with Counter Support  - 1 x daily - 7 x weekly - 2 sets - 10 reps - Standing Hip Extension with Counter Support  - 1 x daily - 7 x  weekly - 2-3 sets - 10 reps - Standing Hip Abduction with Counter Support  - 1 x daily - 7 x weekly - 2-3 sets - 10 reps - Standing Hip Flexion with Counter Support  - 1 x daily - 7 x weekly - 2-3 sets - 10 reps - Mini Squat with Counter Support  - 1 x daily - 7 x weekly - 2 sets - 10 reps - Standing Single Leg Stance with Counter Support  -  1 x daily - 7 x weekly - 1 sets - 2 reps - 30 sec  hold - Standing Tandem Balance with Counter Support  - 1 x daily - 7 x weekly - 1 sets - 2 reps - 20 sec  hold - Standing Row with Anchored Resistance  - 1 x daily - 7 x weekly - 2 sets - 10 reps - Standing Anti-Rotation Press with Anchored Resistance  - 1 x daily - 7 x weekly - 2 sets - 10 reps  ASSESSMENT:  CLINICAL IMPRESSION: Pt arrived stiff today so focused interventions on improve mobility of the spine and musculature. Pt noted crepitus with pulley pallof press as well as in his lower back with HS curls, no pain though. Decreased core activation noted with trunk rotations today. He continues to benefit from skilled therapy.     OBJECTIVE IMPAIRMENTS: Abnormal gait, decreased activity tolerance, decreased balance, decreased endurance, decreased mobility, difficulty walking, decreased ROM, decreased strength, hypomobility, increased muscle spasms, and pain.   ACTIVITY LIMITATIONS: carrying, lifting, bending, sitting, standing, squatting, sleeping, and locomotion level  PARTICIPATION LIMITATIONS: meal prep, cleaning, laundry, driving, shopping, and yard work  PERSONAL FACTORS: Age, Past/current experiences, Time since onset of injury/illness/exacerbation, and 3+ comorbidities: Sepsis secondary to UTI, Acute on chronic diastolic heart failure, Afib with RVR, HTN, dylipidemia, Acute kidney injury on CKD, history of CAD with CABG, TIA, h/o R knee arthroscopic surgery  are also affecting patient's functional outcome.   REHAB POTENTIAL: Good  CLINICAL DECISION MAKING: Evolving/moderate  complexity  EVALUATION COMPLEXITY: Moderate   GOALS: Goals reviewed with patient? Yes  SHORT TERM GOALS: Target date: 01/15/2023   Patient will be independent with initial HEP.  Baseline: needs Goal status: MET  01/15/23   LONG TERM GOALS: Target date: 03/05/2023   Patient will be independent with advanced/ongoing HEP to improve outcomes and carryover.  Baseline:  Goal status: IN PROGRESS 01/30/23  2.  Patient will report 75% improvement in low back pain to improve QOL.  Baseline:  Goal status: IN PROGRESS- 01/30/23 50% improvement   3.  Patient will demonstrate full pain free lumbar ROM to perform ADLs.   Baseline: see objective  Goal status: MET 01/30/23  4.  Patient will demonstrate improved functional strength as demonstrated by 4+/5 hip strength. Baseline: see objective Goal status: IN PROGRESS 01/30/23  5.  Patient will report at least 6 points improvement on modified Oswestry to demonstrate improved functional ability.  Baseline: 17/50 Goal status: IN PROGRESS   6.  Patient will demonstrate improved gait speed to 1.0 m/s for community navigation. Baseline: 0.69 m/s Goal status: IN PROGRESS 01/30/23  7.   Patient be able to maintain tandem stance > 35 sec each side to decrease risk of falls.   Baseline: : L foot forward 30 sec, R foot forward 13 sec, norm for age 53.49 seconds.  Goal status: IN PROGRESS  01/30/23  8. Patient will score >22/24 on DGI to decrease risk of falls.  Baseline: 19/24 Goal status: MET 01/30/23    PLAN:  PT FREQUENCY: 1-2x/week  PT DURATION: 10 weeks  PLANNED INTERVENTIONS: 97164- PT Re-evaluation, 97110-Therapeutic exercises, 97530- Therapeutic activity, 97112- Neuromuscular re-education, 97535- Self Care, 16109- Manual therapy, [phone]- Gait training, [phone]- Orthotic Fit/training, 97014- Electrical stimulation (unattended), [id number]- Electrical stimulation (manual), [id number]- Ultrasound, 91478- Traction (mechanical), Patient/Family  education, Balance training, Stair training, Taping, Dry Needling, Joint mobilization, Joint manipulation, Spinal manipulation, Spinal mobilization, Cryotherapy, and Moist heat.  PLAN FOR NEXT SESSION: HEP for hip/core  strengthening, functional strengthening for back; manual therapy, modalities PRN.  Balance training.   Darleene Cleaver, PTA  02/03/23 12:28 PM

## 2023-02-06 ENCOUNTER — Encounter: Payer: PRIVATE HEALTH INSURANCE | Admitting: Physical Therapy

## 2023-02-10 ENCOUNTER — Ambulatory Visit: Payer: Medicare Other | Admitting: Physical Therapy

## 2023-02-10 ENCOUNTER — Encounter: Payer: Self-pay | Admitting: Physical Therapy

## 2023-02-10 DIAGNOSIS — M6281 Muscle weakness (generalized): Secondary | ICD-10-CM

## 2023-02-10 DIAGNOSIS — R2689 Other abnormalities of gait and mobility: Secondary | ICD-10-CM | POA: Diagnosis not present

## 2023-02-10 DIAGNOSIS — M5459 Other low back pain: Secondary | ICD-10-CM | POA: Diagnosis not present

## 2023-02-10 NOTE — Therapy (Signed)
OUTPATIENT PHYSICAL THERAPY THORACOLUMBAR TREATMENT   Patient Name: Jeffery Barr MRN: [id number] DOB:06/29/1940, 82 y.o., male Today's Date: 02/10/2023    END OF SESSION:  PT End of Session - 02/10/23 1157     Visit Number 12    Date for PT Re-Evaluation 03/05/23    Authorization Type Medicare + [employer] supplement    Progress Note Due on Visit 20    PT Start Time 1151    PT Stop Time 1230    PT Time Calculation (min) 39 min    Activity Tolerance Patient tolerated treatment well    Behavior During Therapy [facility] for tasks assessed/performed                    Past Medical History:  Diagnosis Date   Acute hypoxic respiratory failure (HCC) 11/30/2022   Acute kidney injury superimposed on chronic kidney disease (HCC) 12/04/2022   Acute on chronic diastolic heart failure (HCC) 11/30/2022   Acute respiratory failure with hypoxia (HCC) 07/22/2021   Atrial fibrillation with RVR (HCC) 11/30/2022   Benign essential hypertension 08/27/2021   Benign hypertensive heart disease without heart failure    CAD (coronary artery disease) 11/26/2012   Chronic systolic CHF (congestive heart failure) (HCC) 07/22/2021   Coronary atherosclerosis of native coronary artery    S/P CABG   Dyslipidemia    Generalized weakness 07/22/2021   HTN (hypertension)    Hypoxic respiratory failure (HCC) 11/30/2022   NSTEMI (non-ST elevated myocardial infarction) (HCC) 11/30/2022   Obesity    Obesity (BMI 30-39.9) 11/26/2012   Obesity, class 1 12/04/2022   Other thrombophilia (HCC) 08/27/2021   Paroxysmal atrial fibrillation (HCC) 07/22/2021   Persistent atrial fibrillation (HCC) 08/27/2021   Pneumonia due to COVID-19 virus 07/21/2021   Pure hypercholesterolemia 08/27/2021   Sepsis secondary to UTI (HCC) 11/30/2022   Severe sepsis (HCC) 11/30/2022   Sinus bradycardia    a. baseline HR 50s.   TIA (transient ischemic attack) 05/30/2021   Transaminitis 07/22/2021   Past Surgical History:   Procedure Laterality Date   CORONARY ARTERY BYPASS GRAFT     VASECTOMY     Patient Active Problem List   Diagnosis Date Noted   Obesity, class 1 12/04/2022   Acute kidney injury superimposed on chronic kidney disease (HCC) 12/04/2022   Sepsis secondary to UTI (HCC) 11/30/2022   NSTEMI (non-ST elevated myocardial infarction) (HCC) 11/30/2022   Severe sepsis (HCC) 11/30/2022   Hypoxic respiratory failure (HCC) 11/30/2022   Acute on chronic diastolic heart failure (HCC) 11/30/2022   Atrial fibrillation with RVR (HCC) 11/30/2022   Acute hypoxic respiratory failure (HCC) 11/30/2022   Persistent atrial fibrillation (HCC) 08/27/2021   Benign essential hypertension 08/27/2021   Other thrombophilia (HCC) 08/27/2021   Pure hypercholesterolemia 08/27/2021   Acute respiratory failure with hypoxia (HCC) 07/22/2021   Transaminitis 07/22/2021   Generalized weakness 07/22/2021   Paroxysmal atrial fibrillation (HCC) 07/22/2021   Chronic systolic CHF (congestive heart failure) (HCC) 07/22/2021   Pneumonia due to COVID-19 virus 07/21/2021   TIA (transient ischemic attack) 05/30/2021   CAD (coronary artery disease) 11/26/2012   HTN (hypertension) 11/26/2012   Obesity (BMI 30-39.9) 11/26/2012   Dyslipidemia 11/26/2012    PCP: [name], MD   REFERRING PROVIDER: Hughie Closs, MD   REFERRING DIAG: Unsteadiness on feet (R26.81);Other abnormalities of gait and mobility (R26.89);Pain Pain - part of body:  (back)   Rationale for Evaluation and Treatment: Rehabilitation  THERAPY DIAG:  Other low back pain  Other  abnormalities of gait and mobility  Muscle weakness (generalized)  ONSET DATE: ED to hospital admission 11/29/2022, DC on 12/05/2022  SUBJECTIVE:                                                                                                                                                                                           SUBJECTIVE STATEMENT: Has been sick with chest  cold, coughing today, does not want to lay down.  Back feels fine.   PERTINENT HISTORY:  Sepsis secondary to UTI, Acute on chronic diastolic heart failure, Afib with RVR, HTN, dylipidemia, Acute kidney injury on CKD, history of CAD with CABG, TIA, h/o R knee arthroscopic surgery  PAIN:  Are you having pain? Yes: NPRS scale: 0/10 Pain location: L side low back/glute Pain description: sharp pain for a few seconds  Aggravating factors: sitting too long, transfer from sit to stand Relieving factors: goes away after moving around a little  PRECAUTIONS: Fall  RED FLAGS: None   WEIGHT BEARING RESTRICTIONS: No  FALLS:  Has patient fallen in last 6 months? Yes. Number of falls 1 - at hospital  LIVING ENVIRONMENT: Lives with: lives with their spouse Lives in: House/apartment Stairs: No Has following equipment at home: None  OCCUPATION: retired  PLOF: Independent and Leisure: daily walks around the block ~ 15 min   PATIENT GOALS: get better  NEXT MD VISIT: had follow-up visit last week  OBJECTIVE:   DIAGNOSTIC FINDINGS:  11/29/2022 MRI Lumbar spine IMPRESSION: 1. Motion degraded exam. Additionally, Please note that this is a limited sagittal only MRI for screening of possible epidural abscess or other spinal infection. No axial images were performed. 2. No MRI evidence for acute infection within the cervical, thoracic, or lumbar spine. No epidural abscess. 3. Chronic bilateral pars defects at L5 with associated 6 mm spondylolisthesis, with resultant mild bilateral L5 foraminal stenosis. 4. Otherwise, no other significant disc pathology within the cervical, thoracic, or lumbar spine for age. No other stenosis or impingement.  PATIENT SURVEYS:  ABC scale 1100/1600; 68.8% Modified Oswestry 17/50; 01/30/23- 3/50   COGNITION: Overall cognitive status: Within functional limits for tasks assessed     SENSATION: WFL  MUSCLE LENGTH: Hamstrings: moderate tightness  bil Thomas test: moderate tightness bil  POSTURE: rounded shoulders, forward head, and decreased lumbar lordosis  PALPATION: Tenderness to PA mobs L2-4, L QL  LUMBAR ROM:   AROM eval 01/30/23  Flexion  To mid shin, pull L side low back Mild limitation   Extension WNL WNL   Right lateral flexion To knee, pull L side WNL   Left lateral flexion To knee WNL  Right rotation WNL, pull L side    Left rotation WNL    (Blank rows = not tested)  LOWER EXTREMITY ROM:   Decreased L hip IR (20 deg) compared to R hip (30 deg), postive FABER on L (groin pain) but negative scour test.    LOWER EXTREMITY MMT:    MMT Right eval Left eval Right 01/30/23 Left 01/30/23  Hip flexion 4 4+ 4 4  Hip extension 3+ 3+ 3- 3-  Hip abduction 5 5 5 5   Hip adduction 5 5    Knee flexion 4+ 4+ 5 5  Knee extension 5 5 5 5   Ankle dorsiflexion 5 5    Ankle plantarflexion 5 5     (Blank rows = not tested)  LUMBAR SPECIAL TESTS:  Straight leg raise test: Negative  FUNCTIONAL TESTS:  5 times sit to stand: 10.66 seconds without UE assist.  Timed up and go (TUG): 9.76 sec Dynamic Gait Index: 19/24; 01/30/23- 23/24 MCTSIB: Condition 1: Avg of 3 trials: 30 sec, Condition 2: Avg of 3 trials: 30 sec, Condition 3: Avg of 3 trials: 30 sec, Condition 4: Avg of 3 trials: 30 sec, and Total Score: 120/120 Gait speed 0.69 m/s Tandem L foot 30 , R foot 13 sec    GAIT: Distance walked: 50' Assistive device utilized: None Level of assistance: Complete Independence Comments: wide BOS, decreased heel strike on L, occasional shuffling, forward lean, decreased gait speed,   TODAY'S TREATMENT:                                                                                                                              DATE:  02/10/23 Therapeutic Exercise: to improve strength and mobility.  Demo, verbal and tactile cues throughout for technique. Gait x 600' for warm-up Toe Raises at wall 2 x 10 HEP review and  progression - demo hip hikes on small book, on counter for safety Neuromuscular Reeducation: to improve balance and stability. SBA for safety throughout.  Gait on heels x 100' Gait with cues for heel strike Wall bumps x 20 - cues to try not to rest against wall, just tap and pull foward One foot on stool- with head turns horizontally x 10, vertically x 10 Foot taps on foam block - x 10 each side - harder when RLE in stance Stepping over 1/2 foam roller x 10 each side - more difficult with RLE in stance  02/03/23 Rec Bike L2x62min Wall slides into shld flexion with staggered stance 2x10 Lat pull downs standing 20lb 2x15 Seated rows 25lb 2x12 low grips Trunk rotation blue TB x 10  LTR supine w/ orange pball x 15 each way HS curls orange pball x 10 - pt reports crepitus in lumbar spine Open books x 10 B IASTM foam roll to bil lumbar paraspinals  01/30/23  Modified Oswestry, objective measures for progress note as above, education on goals/progress overall and POC moving forward, role of core  and hip strength as well as wt loss on back pain  TherEx  Bridges x10 TA sets 15x3 second holds Staggered bridges x10 B  TA set + march x12  Tandem stance solid surface 2x30 seconds B Narrow BOS EC solid surface 2x30 seconds B    PATIENT EDUCATION:  Education details: HEP update Person educated: Patient Education method: Explanation, Demonstration, Verbal cues, and Handouts Education comprehension: verbalized understanding and returned demonstration  HOME EXERCISE PROGRAM: Access Code: [email or web address] URL: [email or web address] Date: 02/10/2023 Prepared by: Harrie Foreman  Exercises - Supine Posterior Pelvic Tilt  - 1 x daily - 7 x weekly - 3 sets - 10 reps - Supine Figure 4 Piriformis Stretch  - 2 x daily - 7 x weekly - 3 sets - 3 reps - Supine Bridge  - 1 x daily - 7 x weekly - 2 sets - 10 reps - Heel Raises with Counter Support  - 1 x daily - 7 x weekly - 2 sets - 10  reps - Standing Hip Extension with Counter Support  - 1 x daily - 7 x weekly - 2-3 sets - 10 reps - Standing Hip Abduction with Counter Support  - 1 x daily - 7 x weekly - 2-3 sets - 10 reps - Standing Hip Flexion with Counter Support  - 1 x daily - 7 x weekly - 2-3 sets - 10 reps - Mini Squat with Counter Support  - 1 x daily - 7 x weekly - 2 sets - 10 reps - Standing Single Leg Stance with Counter Support  - 1 x daily - 7 x weekly - 1 sets - 2 reps - 30 sec  hold - Standing Tandem Balance with Counter Support  - 1 x daily - 7 x weekly - 1 sets - 2 reps - 20 sec  hold - Standing Row with Anchored Resistance  - 1 x daily - 7 x weekly - 2 sets - 10 reps - Standing Anti-Rotation Press with Anchored Resistance  - 1 x daily - 7 x weekly - 2 sets - 10 reps - Toe Raise With Back Against Wall  - 1 x daily - 7 x weekly - 2-3 sets - 10 reps - Hip Hiking on Step  - 1 x daily - 7 x weekly - 2-3 sets - 10 reps  ASSESSMENT:  CLINICAL IMPRESSION: Dijon Mcmeans Vences reported missing last visit due to chest cold, still congested with cough.  Noted during warm up walking extremely flat footed and staggering frequently.  Focused interventions on balance and promoting heel strike as well as shifting BOS more posteriorly, discussing importance of not falling back into "bad habits" with gait as this could lead to falls.  Noted difficulty with balance exercises when in R stance, suggesting R glute medius weakness. Progressed HEP.  Donetta Potts Rogala continues to demonstrate potential for improvement and would benefit from continued skilled therapy to address impairments.       OBJECTIVE IMPAIRMENTS: Abnormal gait, decreased activity tolerance, decreased balance, decreased endurance, decreased mobility, difficulty walking, decreased ROM, decreased strength, hypomobility, increased muscle spasms, and pain.   ACTIVITY LIMITATIONS: carrying, lifting, bending, sitting, standing, squatting, sleeping, and locomotion  level  PARTICIPATION LIMITATIONS: meal prep, cleaning, laundry, driving, shopping, and yard work  PERSONAL FACTORS: Age, Past/current experiences, Time since onset of injury/illness/exacerbation, and 3+ comorbidities: Sepsis secondary to UTI, Acute on chronic diastolic heart failure, Afib with RVR, HTN, dylipidemia, Acute kidney injury on CKD, history of CAD with CABG, TIA,  h/o R knee arthroscopic surgery  are also affecting patient's functional outcome.   REHAB POTENTIAL: Good  CLINICAL DECISION MAKING: Evolving/moderate complexity  EVALUATION COMPLEXITY: Moderate   GOALS: Goals reviewed with patient? Yes  SHORT TERM GOALS: Target date: 01/15/2023   Patient will be independent with initial HEP.  Baseline: needs Goal status: MET  01/15/23   LONG TERM GOALS: Target date: 03/05/2023   Patient will be independent with advanced/ongoing HEP to improve outcomes and carryover.  Baseline:  Goal status: IN PROGRESS 01/30/23  2.  Patient will report 75% improvement in low back pain to improve QOL.  Baseline:  Goal status: IN PROGRESS- 01/30/23 50% improvement   3.  Patient will demonstrate full pain free lumbar ROM to perform ADLs.   Baseline: see objective  Goal status: MET 01/30/23  4.  Patient will demonstrate improved functional strength as demonstrated by 4+/5 hip strength. Baseline: see objective Goal status: IN PROGRESS 01/30/23  5.  Patient will report at least 6 points improvement on modified Oswestry to demonstrate improved functional ability.  Baseline: 17/50 Goal status: IN PROGRESS   6.  Patient will demonstrate improved gait speed to 1.0 m/s for community navigation. Baseline: 0.69 m/s Goal status: IN PROGRESS 01/30/23  7.   Patient be able to maintain tandem stance > 35 sec each side to decrease risk of falls.   Baseline: : L foot forward 30 sec, R foot forward 13 sec, norm for age 55.49 seconds.  Goal status: IN PROGRESS  01/30/23  8. Patient will score >22/24  on DGI to decrease risk of falls.  Baseline: 19/24 Goal status: MET 01/30/23    PLAN:  PT FREQUENCY: 1-2x/week  PT DURATION: 10 weeks  PLANNED INTERVENTIONS: 97164- PT Re-evaluation, 97110-Therapeutic exercises, 97530- Therapeutic activity, 97112- Neuromuscular re-education, 97535- Self Care, 81191- Manual therapy, [phone]- Gait training, [phone]- Orthotic Fit/training, 97014- Electrical stimulation (unattended), [id number]- Electrical stimulation (manual), [id number]- Ultrasound, [id number]- Traction (mechanical), Patient/Family education, Balance training, Stair training, Taping, Dry Needling, Joint mobilization, Joint manipulation, Spinal manipulation, Spinal mobilization, Cryotherapy, and Moist heat.  PLAN FOR NEXT SESSION: HEP for hip/core strengthening, functional strengthening for back; manual therapy, modalities PRN.  Balance training.   Jena Gauss, PT  02/10/23 12:52 PM

## 2023-02-13 ENCOUNTER — Encounter: Payer: Self-pay | Admitting: Physical Therapy

## 2023-02-13 ENCOUNTER — Ambulatory Visit: Payer: Medicare Other | Admitting: Physical Therapy

## 2023-02-13 DIAGNOSIS — M5459 Other low back pain: Secondary | ICD-10-CM

## 2023-02-13 DIAGNOSIS — R2689 Other abnormalities of gait and mobility: Secondary | ICD-10-CM

## 2023-02-13 DIAGNOSIS — M6281 Muscle weakness (generalized): Secondary | ICD-10-CM | POA: Diagnosis not present

## 2023-02-13 NOTE — Therapy (Signed)
OUTPATIENT PHYSICAL THERAPY THORACOLUMBAR TREATMENT   Patient Name: Jeffery Barr MRN: [id number] DOB:[birth year], 82 y.o., male Today's Date: 02/13/2023    END OF SESSION:  PT End of Session - 02/13/23 1145     Visit Number 13    Date for PT Re-Evaluation 03/05/23    Authorization Type Medicare + [employer] supplement    Progress Note Due on Visit 20    PT Start Time 1145    PT Stop Time 1219    PT Time Calculation (min) 34 min    Activity Tolerance Patient tolerated treatment well    Behavior During Therapy [facility] for tasks assessed/performed                    Past Medical History:  Diagnosis Date   Acute hypoxic respiratory failure (HCC) 11/30/2022   Acute kidney injury superimposed on chronic kidney disease (HCC) 12/04/2022   Acute on chronic diastolic heart failure (HCC) 11/30/2022   Acute respiratory failure with hypoxia (HCC) 07/22/2021   Atrial fibrillation with RVR (HCC) 11/30/2022   Benign essential hypertension 08/27/2021   Benign hypertensive heart disease without heart failure    CAD (coronary artery disease) 11/26/2012   Chronic systolic CHF (congestive heart failure) (HCC) 07/22/2021   Coronary atherosclerosis of native coronary artery    S/P CABG   Dyslipidemia    Generalized weakness 07/22/2021   HTN (hypertension)    Hypoxic respiratory failure (HCC) 11/30/2022   NSTEMI (non-ST elevated myocardial infarction) (HCC) 11/30/2022   Obesity    Obesity (BMI 30-39.9) 11/26/2012   Obesity, class 1 12/04/2022   Other thrombophilia (HCC) 08/27/2021   Paroxysmal atrial fibrillation (HCC) 07/22/2021   Persistent atrial fibrillation (HCC) 08/27/2021   Pneumonia due to COVID-19 virus 07/21/2021   Pure hypercholesterolemia 08/27/2021   Sepsis secondary to UTI (HCC) 11/30/2022   Severe sepsis (HCC) 11/30/2022   Sinus bradycardia    a. baseline HR 50s.   TIA (transient ischemic attack) 05/30/2021   Transaminitis 07/22/2021   Past Surgical History:   Procedure Laterality Date   CORONARY ARTERY BYPASS GRAFT     VASECTOMY     Patient Active Problem List   Diagnosis Date Noted   Obesity, class 1 12/04/2022   Acute kidney injury superimposed on chronic kidney disease (HCC) 12/04/2022   Sepsis secondary to UTI (HCC) 11/30/2022   NSTEMI (non-ST elevated myocardial infarction) (HCC) 11/30/2022   Severe sepsis (HCC) 11/30/2022   Hypoxic respiratory failure (HCC) 11/30/2022   Acute on chronic diastolic heart failure (HCC) 11/30/2022   Atrial fibrillation with RVR (HCC) 11/30/2022   Acute hypoxic respiratory failure (HCC) 11/30/2022   Persistent atrial fibrillation (HCC) 08/27/2021   Benign essential hypertension 08/27/2021   Other thrombophilia (HCC) 08/27/2021   Pure hypercholesterolemia 08/27/2021   Acute respiratory failure with hypoxia (HCC) 07/22/2021   Transaminitis 07/22/2021   Generalized weakness 07/22/2021   Paroxysmal atrial fibrillation (HCC) 07/22/2021   Chronic systolic CHF (congestive heart failure) (HCC) 07/22/2021   Pneumonia due to COVID-19 virus 07/21/2021   TIA (transient ischemic attack) 05/30/2021   CAD (coronary artery disease) 11/26/2012   HTN (hypertension) 11/26/2012   Obesity (BMI 30-39.9) 11/26/2012   Dyslipidemia 11/26/2012    PCP: [name], MD   REFERRING PROVIDER: Hughie Closs, MD   REFERRING DIAG: Unsteadiness on feet (R26.81);Other abnormalities of gait and mobility (R26.89);Pain Pain - part of body:  (back)   Rationale for Evaluation and Treatment: Rehabilitation  THERAPY DIAG:  Other low back pain  Other  abnormalities of gait and mobility  Muscle weakness (generalized)  ONSET DATE: ED to hospital admission 11/29/2022, DC on 12/05/2022  SUBJECTIVE:                                                                                                                                                                                           SUBJECTIVE STATEMENT: Recovering from  Christmas, ate too much, did too much, watched the kids open a lot of presents.    Only gets back pain now if lays on stomach with exercises.  Needs to leave early.   PERTINENT HISTORY:  Sepsis secondary to UTI, Acute on chronic diastolic heart failure, Afib with RVR, HTN, dylipidemia, Acute kidney injury on CKD, history of CAD with CABG, TIA, h/o R knee arthroscopic surgery  PAIN:  Are you having pain? Yes: NPRS scale: 0/10 Pain location: L side low back/glute Pain description: sharp pain for a few seconds  Aggravating factors: sitting too long, transfer from sit to stand Relieving factors: goes away after moving around a little  PRECAUTIONS: Fall  RED FLAGS: None   WEIGHT BEARING RESTRICTIONS: No  FALLS:  Has patient fallen in last 6 months? Yes. Number of falls 1 - at hospital  LIVING ENVIRONMENT: Lives with: lives with their spouse Lives in: House/apartment Stairs: No Has following equipment at home: None  OCCUPATION: retired  PLOF: Independent and Leisure: daily walks around the block ~ 15 min   PATIENT GOALS: get better  NEXT MD VISIT: had follow-up visit last week  OBJECTIVE:   DIAGNOSTIC FINDINGS:  11/29/2022 MRI Lumbar spine IMPRESSION: 1. Motion degraded exam. Additionally, Please note that this is a limited sagittal only MRI for screening of possible epidural abscess or other spinal infection. No axial images were performed. 2. No MRI evidence for acute infection within the cervical, thoracic, or lumbar spine. No epidural abscess. 3. Chronic bilateral pars defects at L5 with associated 6 mm spondylolisthesis, with resultant mild bilateral L5 foraminal stenosis. 4. Otherwise, no other significant disc pathology within the cervical, thoracic, or lumbar spine for age. No other stenosis or impingement.  PATIENT SURVEYS:  ABC scale 1100/1600; 68.8% Modified Oswestry 17/50; 01/30/23- 3/50   COGNITION: Overall cognitive status: Within functional limits  for tasks assessed     SENSATION: WFL  MUSCLE LENGTH: Hamstrings: moderate tightness bil Thomas test: moderate tightness bil  POSTURE: rounded shoulders, forward head, and decreased lumbar lordosis  PALPATION: Tenderness to PA mobs L2-4, L QL  LUMBAR ROM:   AROM eval 01/30/23  Flexion  To mid shin, pull L side low back Mild limitation   Extension WNL WNL  Right lateral flexion To knee, pull L side WNL   Left lateral flexion To knee WNL   Right rotation WNL, pull L side    Left rotation WNL    (Blank rows = not tested)  LOWER EXTREMITY ROM:   Decreased L hip IR (20 deg) compared to R hip (30 deg), postive FABER on L (groin pain) but negative scour test.    LOWER EXTREMITY MMT:    MMT Right eval Left eval Right 01/30/23 Left 01/30/23  Hip flexion 4 4+ 4 4  Hip extension 3+ 3+ 3- 3-  Hip abduction 5 5 5 5   Hip adduction 5 5    Knee flexion 4+ 4+ 5 5  Knee extension 5 5 5 5   Ankle dorsiflexion 5 5    Ankle plantarflexion 5 5     (Blank rows = not tested)  LUMBAR SPECIAL TESTS:  Straight leg raise test: Negative  FUNCTIONAL TESTS:  5 times sit to stand: 10.66 seconds without UE assist.  Timed up and go (TUG): 9.76 sec Dynamic Gait Index: 19/24; 01/30/23- 23/24 MCTSIB: Condition 1: Avg of 3 trials: 30 sec, Condition 2: Avg of 3 trials: 30 sec, Condition 3: Avg of 3 trials: 30 sec, Condition 4: Avg of 3 trials: 30 sec, and Total Score: 120/120 Gait speed 0.69 m/s Tandem L foot 30 , R foot 13 sec    GAIT: Distance walked: 41' Assistive device utilized: None Level of assistance: Complete Independence Comments: wide BOS, decreased heel strike on L, occasional shuffling, forward lean, decreased gait speed,   TODAY'S TREATMENT:                                                                                                                              DATE: 02/13/23 Neuromuscular Reeducation: to improve balance and stability. SBA for safety throughout.  Bike L2 x  5 min for warm-up/muscle perfusion In hallways -  Gait with ball toss - 2 x 100' Gait with ball catch 2 x 75' Tandem gait forward x 40', retro x 40' - 1 hand on wall Stepping over 1/2 foam roller x 20 each side Side stepping over 1/2 foam roller x 20 each side  02/10/23 Therapeutic Exercise: to improve strength and mobility.  Demo, verbal and tactile cues throughout for technique. Gait x 600' for warm-up Toe Raises at wall 2 x 10 HEP review and progression - demo hip hikes on small book, on counter for safety Neuromuscular Reeducation: to improve balance and stability. SBA for safety throughout.  Gait on heels x 100' Gait with cues for heel strike Wall bumps x 20 - cues to try not to rest against wall, just tap and pull foward One foot on stool- with head turns horizontally x 10, vertically x 10 Foot taps on foam block - x 10 each side - harder when RLE in stance Stepping over 1/2 foam roller x 10 each side - more difficult with RLE in  stance  02/03/23 Rec Bike L2x17min Wall slides into shld flexion with staggered stance 2x10 Lat pull downs standing 20lb 2x15 Seated rows 25lb 2x12 low grips Trunk rotation blue TB x 10  LTR supine w/ orange pball x 15 each way HS curls orange pball x 10 - pt reports crepitus in lumbar spine Open books x 10 B IASTM foam roll to bil lumbar paraspinals  01/30/23  Modified Oswestry, objective measures for progress note as above, education on goals/progress overall and POC moving forward, role of core and hip strength as well as wt loss on back pain  TherEx  Bridges x10 TA sets 15x3 second holds Staggered bridges x10 B  TA set + march x12  Tandem stance solid surface 2x30 seconds B Narrow BOS EC solid surface 2x30 seconds B    PATIENT EDUCATION:  Education details: HEP update Person educated: Patient Education method: Explanation, Demonstration, Verbal cues, and Handouts Education comprehension: verbalized understanding and returned  demonstration  HOME EXERCISE PROGRAM: Access Code: [email or web address] URL: [email or web address] Date: 02/10/2023 Prepared by: Harrie Foreman  Exercises - Supine Posterior Pelvic Tilt  - 1 x daily - 7 x weekly - 3 sets - 10 reps - Supine Figure 4 Piriformis Stretch  - 2 x daily - 7 x weekly - 3 sets - 3 reps - Supine Bridge  - 1 x daily - 7 x weekly - 2 sets - 10 reps - Heel Raises with Counter Support  - 1 x daily - 7 x weekly - 2 sets - 10 reps - Standing Hip Extension with Counter Support  - 1 x daily - 7 x weekly - 2-3 sets - 10 reps - Standing Hip Abduction with Counter Support  - 1 x daily - 7 x weekly - 2-3 sets - 10 reps - Standing Hip Flexion with Counter Support  - 1 x daily - 7 x weekly - 2-3 sets - 10 reps - Mini Squat with Counter Support  - 1 x daily - 7 x weekly - 2 sets - 10 reps - Standing Single Leg Stance with Counter Support  - 1 x daily - 7 x weekly - 1 sets - 2 reps - 30 sec  hold - Standing Tandem Balance with Counter Support  - 1 x daily - 7 x weekly - 1 sets - 2 reps - 20 sec  hold - Standing Row with Anchored Resistance  - 1 x daily - 7 x weekly - 2 sets - 10 reps - Standing Anti-Rotation Press with Anchored Resistance  - 1 x daily - 7 x weekly - 2 sets - 10 reps - Toe Raise With Back Against Wall  - 1 x daily - 7 x weekly - 2-3 sets - 10 reps - Hip Hiking on Step  - 1 x daily - 7 x weekly - 2-3 sets - 10 reps  ASSESSMENT:  CLINICAL IMPRESSION: Jeffery Barr reports back pain has almost completely resolved, minimal pain first thing in morning, disappears after a few minutes, or with prone exercises, will hold anymore prone exercises, has met LTG #2.  His primary concern is still balance, so continued working on more higher level balance activities, still very challenged with tandem gait but otherwise had no difficulty, even if had small LOB easily able to correct self regain balance without assistance and improve on subsequent reps.  Discussed D/C next  visit, as has HEP and can continue to work on tandem walking which is most challenging task now in  hallway at home.   Jeffery Barr continues to demonstrate potential for improvement and would benefit from continued skilled therapy to address impairments.       OBJECTIVE IMPAIRMENTS: Abnormal gait, decreased activity tolerance, decreased balance, decreased endurance, decreased mobility, difficulty walking, decreased ROM, decreased strength, hypomobility, increased muscle spasms, and pain.   ACTIVITY LIMITATIONS: carrying, lifting, bending, sitting, standing, squatting, sleeping, and locomotion level  PARTICIPATION LIMITATIONS: meal prep, cleaning, laundry, driving, shopping, and yard work  PERSONAL FACTORS: Age, Past/current experiences, Time since onset of injury/illness/exacerbation, and 3+ comorbidities: Sepsis secondary to UTI, Acute on chronic diastolic heart failure, Afib with RVR, HTN, dylipidemia, Acute kidney injury on CKD, history of CAD with CABG, TIA, h/o R knee arthroscopic surgery  are also affecting patient's functional outcome.   REHAB POTENTIAL: Good  CLINICAL DECISION MAKING: Evolving/moderate complexity  EVALUATION COMPLEXITY: Moderate   GOALS: Goals reviewed with patient? Yes  SHORT TERM GOALS: Target date: 01/15/2023   Patient will be independent with initial HEP.  Baseline: needs Goal status: MET  01/15/23   LONG TERM GOALS: Target date: 03/05/2023   Patient will be independent with advanced/ongoing HEP to improve outcomes and carryover.  Baseline:  Goal status: IN PROGRESS 01/30/23  2.  Patient will report 75% improvement in low back pain to improve QOL.  Baseline:  Goal status: MET- 01/30/23 50% improvement 02/13/23- at least 75% improvement, only has minimal pain now first thing in morning when walks up, goes away after a few minutes.   3.  Patient will demonstrate full pain free lumbar ROM to perform ADLs.   Baseline: see objective  Goal status: MET  01/30/23  4.  Patient will demonstrate improved functional strength as demonstrated by 4+/5 hip strength. Baseline: see objective Goal status: IN PROGRESS 01/30/23  5.  Patient will report at least 6 points improvement on modified Oswestry to demonstrate improved functional ability.  Baseline: 17/50 Goal status: IN PROGRESS   6.  Patient will demonstrate improved gait speed to 1.0 m/s for community navigation. Baseline: 0.69 m/s Goal status: IN PROGRESS 01/30/23  7.   Patient be able to maintain tandem stance > 35 sec each side to decrease risk of falls.   Baseline: : L foot forward 30 sec, R foot forward 13 sec, norm for age 38.49 seconds.  Goal status: IN PROGRESS  01/30/23  8. Patient will score >22/24 on DGI to decrease risk of falls.  Baseline: 19/24 Goal status: MET 01/30/23    PLAN:  PT FREQUENCY: 1-2x/week  PT DURATION: 10 weeks  PLANNED INTERVENTIONS: 97164- PT Re-evaluation, 97110-Therapeutic exercises, 97530- Therapeutic activity, 97112- Neuromuscular re-education, 97535- Self Care, 69629- Manual therapy, [phone]- Gait training, [phone]- Orthotic Fit/training, 97014- Electrical stimulation (unattended), [id number]- Electrical stimulation (manual), [id number]- Ultrasound, 10272- Traction (mechanical), Patient/Family education, Balance training, Stair training, Taping, Dry Needling, Joint mobilization, Joint manipulation, Spinal manipulation, Spinal mobilization, Cryotherapy, and Moist heat.  PLAN FOR NEXT SESSION: recheck remaining goals, D/C to HEP - add tandem walking  Jena Gauss, PT  02/13/23 1:14 PM

## 2023-02-17 ENCOUNTER — Ambulatory Visit: Payer: Medicare Other

## 2023-02-18 ENCOUNTER — Ambulatory Visit: Payer: Medicare Other

## 2023-02-18 ENCOUNTER — Other Ambulatory Visit: Payer: Self-pay

## 2023-02-18 DIAGNOSIS — M6281 Muscle weakness (generalized): Secondary | ICD-10-CM | POA: Diagnosis not present

## 2023-02-18 DIAGNOSIS — R2689 Other abnormalities of gait and mobility: Secondary | ICD-10-CM | POA: Diagnosis not present

## 2023-02-18 DIAGNOSIS — M5459 Other low back pain: Secondary | ICD-10-CM

## 2023-02-18 NOTE — Therapy (Signed)
 OUTPATIENT PHYSICAL THERAPY THORACOLUMBAR TREATMENT   Patient Name: Jeffery Barr MRN: [id number] DOB:09/18/1940, 82 y.o., male Today's Date: 02/18/2023    END OF SESSION:  PT End of Session - 02/18/23 1218     Visit Number 14    PT Start Time 1145    PT Stop Time 1217    PT Time Calculation (min) 32 min    Activity Tolerance Patient tolerated treatment well    Behavior During Therapy [facility] for tasks assessed/performed                     Past Medical History:  Diagnosis Date   Acute hypoxic respiratory failure (HCC) 11/30/2022   Acute kidney injury superimposed on chronic kidney disease (HCC) 12/04/2022   Acute on chronic diastolic heart failure (HCC) 11/30/2022   Acute respiratory failure with hypoxia (HCC) 07/22/2021   Atrial fibrillation with RVR (HCC) 11/30/2022   Benign essential hypertension 08/27/2021   Benign hypertensive heart disease without heart failure    CAD (coronary artery disease) 11/26/2012   Chronic systolic CHF (congestive heart failure) (HCC) 07/22/2021   Coronary atherosclerosis of native coronary artery    S/P CABG   Dyslipidemia    Generalized weakness 07/22/2021   HTN (hypertension)    Hypoxic respiratory failure (HCC) 11/30/2022   NSTEMI (non-ST elevated myocardial infarction) (HCC) 11/30/2022   Obesity    Obesity (BMI 30-39.9) 11/26/2012   Obesity, class 1 12/04/2022   Other thrombophilia (HCC) 08/27/2021   Paroxysmal atrial fibrillation (HCC) 07/22/2021   Persistent atrial fibrillation (HCC) 08/27/2021   Pneumonia due to COVID-19 virus 07/21/2021   Pure hypercholesterolemia 08/27/2021   Sepsis secondary to UTI (HCC) 11/30/2022   Severe sepsis (HCC) 11/30/2022   Sinus bradycardia    a. baseline HR 50s.   TIA (transient ischemic attack) 05/30/2021   Transaminitis 07/22/2021   Past Surgical History:  Procedure Laterality Date   CORONARY ARTERY BYPASS GRAFT     VASECTOMY     Patient Active Problem List   Diagnosis Date  Noted   Obesity, class 1 12/04/2022   Acute kidney injury superimposed on chronic kidney disease (HCC) 12/04/2022   Sepsis secondary to UTI (HCC) 11/30/2022   NSTEMI (non-ST elevated myocardial infarction) (HCC) 11/30/2022   Severe sepsis (HCC) 11/30/2022   Hypoxic respiratory failure (HCC) 11/30/2022   Acute on chronic diastolic heart failure (HCC) 11/30/2022   Atrial fibrillation with RVR (HCC) 11/30/2022   Acute hypoxic respiratory failure (HCC) 11/30/2022   Persistent atrial fibrillation (HCC) 08/27/2021   Benign essential hypertension 08/27/2021   Other thrombophilia (HCC) 08/27/2021   Pure hypercholesterolemia 08/27/2021   Acute respiratory failure with hypoxia (HCC) 07/22/2021   Transaminitis 07/22/2021   Generalized weakness 07/22/2021   Paroxysmal atrial fibrillation (HCC) 07/22/2021   Chronic systolic CHF (congestive heart failure) (HCC) 07/22/2021   Pneumonia due to COVID-19 virus 07/21/2021   TIA (transient ischemic attack) 05/30/2021   CAD (coronary artery disease) 11/26/2012   HTN (hypertension) 11/26/2012   Obesity (BMI 30-39.9) 11/26/2012   Dyslipidemia 11/26/2012    PCP: [name], MD   REFERRING PROVIDER: Vernon Ranks, MD   REFERRING DIAG: Unsteadiness on feet (R26.81);Other abnormalities of gait and mobility (R26.89);Pain Pain - part of body:  (back)   Rationale for Evaluation and Treatment: Rehabilitation  THERAPY DIAG:  Other low back pain  Other abnormalities of gait and mobility  Muscle weakness (generalized)  ONSET DATE: ED to hospital admission 11/29/2022, DC on 12/05/2022  SUBJECTIVE:  SUBJECTIVE STATEMENT: Recovering from Christmas, ate too much, did too much, watched the kids open a lot of presents.    Only gets back pain now if lays on stomach with  exercises.  Needs to leave early.   PERTINENT HISTORY:  Sepsis secondary to UTI, Acute on chronic diastolic heart failure, Afib with RVR, HTN, dylipidemia, Acute kidney injury on CKD, history of CAD with CABG, TIA, h/o R knee arthroscopic surgery  PAIN:  Are you having pain? Yes: NPRS scale: 0/10 Pain location: L side low back/glute Pain description: sharp pain for a few seconds  Aggravating factors: sitting too long, transfer from sit to stand Relieving factors: goes away after moving around a little  PRECAUTIONS: Fall  RED FLAGS: None   WEIGHT BEARING RESTRICTIONS: No  FALLS:  Has patient fallen in last 6 months? Yes. Number of falls 1 - at hospital  LIVING ENVIRONMENT: Lives with: lives with their spouse Lives in: House/apartment Stairs: No Has following equipment at home: None  OCCUPATION: retired  PLOF: Independent and Leisure: daily walks around the block ~ 15 min   PATIENT GOALS: get better  NEXT MD VISIT: had follow-up visit last week  OBJECTIVE:   DIAGNOSTIC FINDINGS:  11/29/2022 MRI Lumbar spine IMPRESSION: 1. Motion degraded exam. Additionally, Please note that this is a limited sagittal only MRI for screening of possible epidural abscess or other spinal infection. No axial images were performed. 2. No MRI evidence for acute infection within the cervical, thoracic, or lumbar spine. No epidural abscess. 3. Chronic bilateral pars defects at L5 with associated 6 mm spondylolisthesis, with resultant mild bilateral L5 foraminal stenosis. 4. Otherwise, no other significant disc pathology within the cervical, thoracic, or lumbar spine for age. No other stenosis or impingement.  PATIENT SURVEYS:  ABC scale 1100/1600; 68.8% Modified Oswestry 17/50; 01/30/23- 3/50   COGNITION: Overall cognitive status: Within functional limits for tasks assessed     SENSATION: WFL  MUSCLE LENGTH: Hamstrings: moderate tightness bil Thomas test: moderate tightness  bil  POSTURE: rounded shoulders, forward head, and decreased lumbar lordosis  PALPATION: Tenderness to PA mobs L2-4, L QL  LUMBAR ROM:   AROM eval 01/30/23  Flexion  To mid shin, pull L side low back Mild limitation   Extension WNL WNL   Right lateral flexion To knee, pull L side WNL   Left lateral flexion To knee WNL   Right rotation WNL, pull L side    Left rotation WNL    (Blank rows = not tested)  LOWER EXTREMITY ROM:   Decreased L hip IR (20 deg) compared to R hip (30 deg), postive FABER on L (groin pain) but negative scour test.    LOWER EXTREMITY MMT:    MMT Right eval Left eval Right 01/30/23 Left 01/30/23 02/18/23  Hip flexion 4 4+ 4 4 5   Hip extension 3+ 3+ 3- 3-   Hip abduction 5 5 5 5    Hip adduction 5 5     Knee flexion 4+ 4+ 5 5   Knee extension 5 5 5 5    Ankle dorsiflexion 5 5     Ankle plantarflexion 5 5      (Blank rows = not tested)  LUMBAR SPECIAL TESTS:  Straight leg raise test: Negative  FUNCTIONAL TESTS:  5 times sit to stand: 10.66 seconds without UE assist.  Timed up and go (TUG): 9.76 sec Dynamic Gait Index: 19/24; 01/30/23- 23/24 MCTSIB: Condition 1: Avg of 3 trials: 30 sec, Condition 2: Avg of  3 trials: 30 sec, Condition 3: Avg of 3 trials: 30 sec, Condition 4: Avg of 3 trials: 30 sec, and Total Score: 120/120 Gait speed 0.69 m/s Tandem L foot 30 , R foot 13 sec    GAIT: Distance walked: 30' Assistive device utilized: None Level of assistance: Complete Independence Comments: wide BOS, decreased heel strike on L, occasional shuffling, forward lean, decreased gait speed,   TODAY'S TREATMENT:                                                                                                                              DATE: 02/18/23: The patient was reassessed for his discharge status.  Also instructed in the following ex/ activities to address his higher level balance and function:  Standing tandem over 35 sec each side Standing tandem  with lead foot on airex 30 sec holds Adapted standing hip abd by adding red t band around thighs  Re instructed in standing hip/pelvic drops each side, 10 reps each Practiced tandem walking 20' , occasionally used counter for support.  02/13/23 Neuromuscular Reeducation: to improve balance and stability. SBA for safety throughout.  Bike L2 x 5 min for warm-up/muscle perfusion In hallways -  Gait with ball toss - 2 x 100' Gait with ball catch 2 x 75' Tandem gait forward x 40', retro x 40' - 1 hand on wall Stepping over 1/2 foam roller x 20 each side Side stepping over 1/2 foam roller x 20 each side  02/10/23 Therapeutic Exercise: to improve strength and mobility.  Demo, verbal and tactile cues throughout for technique. Gait x 600' for warm-up Toe Raises at wall 2 x 10 HEP review and progression - demo hip hikes on small book, on counter for safety Neuromuscular Reeducation: to improve balance and stability. SBA for safety throughout.  Gait on heels x 100' Gait with cues for heel strike Wall bumps x 20 - cues to try not to rest against wall, just tap and pull foward One foot on stool- with head turns horizontally x 10, vertically x 10 Foot taps on foam block - x 10 each side - harder when RLE in stance Stepping over 1/2 foam roller x 10 each side - more difficult with RLE in stance  02/03/23 Rec Bike L2x75min Wall slides into shld flexion with staggered stance 2x10 Lat pull downs standing 20lb 2x15 Seated rows 25lb 2x12 low grips Trunk rotation blue TB x 10  LTR supine w/ orange pball x 15 each way HS curls orange pball x 10 - pt reports crepitus in lumbar spine Open books x 10 B IASTM foam roll to bil lumbar paraspinals  01/30/23  Modified Oswestry, objective measures for progress note as above, education on goals/progress overall and POC moving forward, role of core and hip strength as well as wt loss on back pain  TherEx  Bridges x10 TA sets 15x3 second holds Staggered  bridges x10 B  TA set + march  x12  Tandem stance solid surface 2x30 seconds B Narrow BOS EC solid surface 2x30 seconds B    PATIENT EDUCATION:  Education details: HEP update Person educated: Patient Education method: Explanation, Demonstration, Verbal cues, and Handouts Education comprehension: verbalized understanding and returned demonstration  HOME EXERCISE PROGRAM: Access Code: [email or web address] URL: [email or web address] Date: 02/10/2023 Prepared by: Almarie Sprinkles  Exercises - Supine Posterior Pelvic Tilt  - 1 x daily - 7 x weekly - 3 sets - 10 reps - Supine Figure 4 Piriformis Stretch  - 2 x daily - 7 x weekly - 3 sets - 3 reps - Supine Bridge  - 1 x daily - 7 x weekly - 2 sets - 10 reps - Heel Raises with Counter Support  - 1 x daily - 7 x weekly - 2 sets - 10 reps - Standing Hip Extension with Counter Support  - 1 x daily - 7 x weekly - 2-3 sets - 10 reps - Standing Hip Abduction with Counter Support  - 1 x daily - 7 x weekly - 2-3 sets - 10 reps - Standing Hip Flexion with Counter Support  - 1 x daily - 7 x weekly - 2-3 sets - 10 reps - Mini Squat with Counter Support  - 1 x daily - 7 x weekly - 2 sets - 10 reps - Standing Single Leg Stance with Counter Support  - 1 x daily - 7 x weekly - 1 sets - 2 reps - 30 sec  hold - Standing Tandem Balance with Counter Support  - 1 x daily - 7 x weekly - 1 sets - 2 reps - 20 sec  hold - Standing Row with Anchored Resistance  - 1 x daily - 7 x weekly - 2 sets - 10 reps - Standing Anti-Rotation Press with Anchored Resistance  - 1 x daily - 7 x weekly - 2 sets - 10 reps - Toe Raise With Back Against Wall  - 1 x daily - 7 x weekly - 2-3 sets - 10 reps - Hip Hiking on Step  - 1 x daily - 7 x weekly - 2-3 sets - 10 reps  ASSESSMENT:  CLINICAL IMPRESSION: Jeffery Barr reports back pain has almost completely resolved, still minimal pain, stiffness first thing in morning, disappears after a few minutes.  Reassessed strength and  only has noticeable weakness B hip abductors, so reinforced focusing his strengthening on this area.  He agreed with DC today.    Jeffery Barr continues to demonstrate potential for improvement and would benefit from continued skilled therapy to address impairments.       OBJECTIVE IMPAIRMENTS: Abnormal gait, decreased activity tolerance, decreased balance, decreased endurance, decreased mobility, difficulty walking, decreased ROM, decreased strength, hypomobility, increased muscle spasms, and pain.   ACTIVITY LIMITATIONS: carrying, lifting, bending, sitting, standing, squatting, sleeping, and locomotion level  PARTICIPATION LIMITATIONS: meal prep, cleaning, laundry, driving, shopping, and yard work  PERSONAL FACTORS: Age, Past/current experiences, Time since onset of injury/illness/exacerbation, and 3+ comorbidities: Sepsis secondary to UTI, Acute on chronic diastolic heart failure, Afib with RVR, HTN, dylipidemia, Acute kidney injury on CKD, history of CAD with CABG, TIA, h/o R knee arthroscopic surgery  are also affecting patient's functional outcome.   REHAB POTENTIAL: Good  CLINICAL DECISION MAKING: Evolving/moderate complexity  EVALUATION COMPLEXITY: Moderate   GOALS: Goals reviewed with patient? Yes  SHORT TERM GOALS: Target date: 01/15/2023   Patient will be independent with initial HEP.  Baseline: needs Goal status: MET  01/15/23   LONG TERM GOALS: Target date: 03/05/2023   Patient will be independent with advanced/ongoing HEP to improve outcomes and carryover.  Baseline:  Goal status: IN PROGRESS 01/30/23 02/18/23: goal met   2.  Patient will report 75% improvement in low back pain to improve QOL.  Baseline:  Goal status: MET- 01/30/23 50% improvement 02/13/23- at least 75% improvement, only has minimal pain now first thing in morning when walks up, goes away after a few minutes.   3.  Patient will demonstrate full pain free lumbar ROM to perform ADLs.   Baseline:  see objective  Goal status: MET 01/30/23  4.  Patient will demonstrate improved functional strength as demonstrated by 4+/5 hip strength. Baseline: see objective Goal status: IN PROGRESS 01/30/23 02/18/23: goal met except still 4/5 strength hip abductors B  5.  Patient will report at least 6 points improvement on modified Oswestry to demonstrate improved functional ability.  Baseline: 17/50 Goal status:   02/18/23: 3/50, goal met   6.  Patient will demonstrate improved gait speed to 1.0 m/s for community navigation. Baseline: 0.69 m/s Goal status: IN PROGRESS 01/30/23 02/18/23: still in progress, gait speed 0.81 m/sec today   7.   Patient be able to maintain tandem stance > 35 sec each side to decrease risk of falls.   Baseline: : L foot forward 30 sec, R foot forward 13 sec, norm for age 40.49 seconds.  Goal status: IN PROGRESS  01/30/23 02/18/23: goal met today with each leg  8. Patient will score >22/24 on DGI to decrease risk of falls.  Baseline: 19/24 Goal status: MET 01/30/23    PLAN:  PT FREQUENCY: 1-2x/week  PT DURATION: 10 weeks  PLANNED INTERVENTIONS: 97164- PT Re-evaluation, 97110-Therapeutic exercises, 97530- Therapeutic activity, 97112- Neuromuscular re-education, 97535- Self Care, 02859- Manual therapy, [phone]- Gait training, [phone]- Orthotic Fit/training, 97014- Electrical stimulation (unattended), [id number]- Electrical stimulation (manual), [id number]- Ultrasound, 02987- Traction (mechanical), Patient/Family education, Balance training, Stair training, Taping, Dry Needling, Joint mobilization, Joint manipulation, Spinal manipulation, Spinal mobilization, Cryotherapy, and Moist heat.  PLAN FOR NEXT SESSION:  D/C to HEP   Hank Walling L Abrahan Fulmore, PT , DPT, OCS 02/18/23 12:47 PM

## 2023-02-20 ENCOUNTER — Encounter: Payer: PRIVATE HEALTH INSURANCE | Admitting: Physical Therapy

## 2023-02-27 ENCOUNTER — Encounter: Payer: Self-pay | Admitting: Cardiology

## 2023-02-28 ENCOUNTER — Encounter: Payer: Self-pay | Admitting: Cardiology

## 2023-02-28 ENCOUNTER — Ambulatory Visit: Payer: Medicare Other | Attending: Cardiology | Admitting: Cardiology

## 2023-02-28 VITALS — BP 144/68 | HR 60 | Ht 69.0 in | Wt 213.0 lb

## 2023-02-28 DIAGNOSIS — I480 Paroxysmal atrial fibrillation: Secondary | ICD-10-CM | POA: Diagnosis not present

## 2023-02-28 DIAGNOSIS — I2510 Atherosclerotic heart disease of native coronary artery without angina pectoris: Secondary | ICD-10-CM | POA: Diagnosis present

## 2023-02-28 DIAGNOSIS — I5022 Chronic systolic (congestive) heart failure: Secondary | ICD-10-CM | POA: Insufficient documentation

## 2023-02-28 DIAGNOSIS — E7800 Pure hypercholesterolemia, unspecified: Secondary | ICD-10-CM | POA: Diagnosis not present

## 2023-02-28 MED ORDER — FUROSEMIDE 40 MG PO TABS: 20.0000 mg | ORAL_TABLET | Freq: Every day | ORAL | 3 refills | Status: DC

## 2023-02-28 MED ORDER — CARVEDILOL 25 MG PO TABS: 25.0000 mg | ORAL_TABLET | Freq: Two times a day (BID) | ORAL | 3 refills | Status: DC

## 2023-02-28 NOTE — Progress Notes (Signed)
 Cardiology Office Note:    Date:  02/28/2023   ID:  Jeffery Barr, DOB 31-Oct-1940, MRN [id number]  PCP:  [name], MD  Cardiologist:  Lamar Fitch, MD    Referring MD: [name], MD   Chief Complaint  Patient presents with   Follow-up    History of Present Illness:    Jeffery Barr is a 83 y.o. male past medical history significant for coronary artery disease status post coronary bypass grafting many years ago, dyslipidemia, essential hypertension, obesity, paroxysmal atrial fibrillation.  He was refusing anticoagulation but now after being discharged from hospital he is on it.  Comes today to months for follow-up I did see him last time shortly after hospitalization, he was hospitalized because of sepsis did receive a lot of fluid and up being in congestive heart failure diuretic started after that.  Comes today for follow-up complaining of urinating a lot denies have any chest pain tightness squeezing pressure burning chest overall he looks good.  Past Medical History:  Diagnosis Date   Acute hypoxic respiratory failure (HCC) 11/30/2022   Acute kidney injury superimposed on chronic kidney disease (HCC) 12/04/2022   Acute on chronic diastolic heart failure (HCC) 11/30/2022   Acute respiratory failure with hypoxia (HCC) 07/22/2021   Atrial fibrillation with RVR (HCC) 11/30/2022   Benign essential hypertension 08/27/2021   Benign hypertensive heart disease without heart failure    CAD (coronary artery disease) 11/26/2012   Chronic systolic CHF (congestive heart failure) (HCC) 07/22/2021   Coronary atherosclerosis of native coronary artery    S/P CABG   Dyslipidemia    Generalized weakness 07/22/2021   HTN (hypertension)    Hypoxic respiratory failure (HCC) 11/30/2022   NSTEMI (non-ST elevated myocardial infarction) (HCC) 11/30/2022   Obesity    Obesity (BMI 30-39.9) 11/26/2012   Obesity, class 1 12/04/2022   Other thrombophilia (HCC) 08/27/2021    Paroxysmal atrial fibrillation (HCC) 07/22/2021   Persistent atrial fibrillation (HCC) 08/27/2021   Pneumonia due to COVID-19 virus 07/21/2021   Pure hypercholesterolemia 08/27/2021   Sepsis secondary to UTI (HCC) 11/30/2022   Severe sepsis (HCC) 11/30/2022   Sinus bradycardia    a. baseline HR 50s.   TIA (transient ischemic attack) 05/30/2021   Transaminitis 07/22/2021    Past Surgical History:  Procedure Laterality Date   CORONARY ARTERY BYPASS GRAFT     VASECTOMY      Current Medications: Current Meds  Medication Sig   amLODipine  (NORVASC ) 5 MG tablet Take 1 tablet (5 mg total) by mouth daily.   apixaban  (ELIQUIS ) 5 MG TABS tablet Take 1 tablet (5 mg total) by mouth 2 (two) times daily.   aspirin  EC 81 MG tablet Take 81 mg by mouth daily. Swallow whole.   ezetimibe  (ZETIA ) 10 MG tablet Take 1 tablet (10 mg total) by mouth daily.   furosemide  (LASIX ) 40 MG tablet Take 0.5 tablets (20 mg total) by mouth daily.   rosuvastatin  (CRESTOR ) 20 MG tablet Take 1 tablet (20 mg total) by mouth daily.   [DISCONTINUED] carvedilol  (COREG ) 25 MG tablet Take 1 tablet (25 mg total) by mouth 2 (two) times daily.   [DISCONTINUED] furosemide  (LASIX ) 40 MG tablet Take 1 tablet (40 mg total) by mouth daily.     Allergies:   Patient has no known allergies.   Social History   Socioeconomic History   Marital status: Married    Spouse name: Not on file   Number of children: Not on file   Years  of education: Not on file   Highest education level: Not on file  Occupational History   Not on file  Tobacco Use   Smoking status: Former   Smokeless tobacco: Never  Vaping Use   Vaping status: Never Used  Substance and Sexual Activity   Alcohol use: Yes    Alcohol/week: 3.0 - 5.0 standard drinks of alcohol    Types: 1 - 2 Glasses of wine, 1 - 2 Cans of beer, 1 Shots of liquor per week    Comment: 1-2 glasses of wine and beer weekly   Drug use: No   Sexual activity: Not on file  Other Topics  Concern   Not on file  Social History Narrative   Right handed   Lives with wife,   One story home,Retired.   Social Drivers of Corporate Investment Banker Strain: Not on file  Food Insecurity: No Food Insecurity (11/30/2022)   Hunger Vital Sign    Worried About Running Out of Food in the Last Year: Never true    Ran Out of Food in the Last Year: Never true  Transportation Needs: No Transportation Needs (11/30/2022)   PRAPARE - Administrator, Civil Service (Medical): No    Lack of Transportation (Non-Medical): No  Physical Activity: Not on file  Stress: Not on file  Social Connections: Not on file     Family History: The patient's family history includes CAD in his mother, sister, and another family member; Heart attack in an other family member; Heart disease in his brother; Heart failure in his mother. ROS:   Please see the history of present illness.    All 14 point review of systems negative except as described per history of present illness  EKGs/Labs/Other Studies Reviewed:         Recent Labs: 11/29/2022: ALT 16 12/03/2022: B Natriuretic Peptide 1,901.8; Hemoglobin 12.7; Platelets 211 12/04/2022: Magnesium 2.1 12/13/2022: NT-Pro BNP 611 12/24/2022: BUN 20; Creatinine, Ser 1.23; Potassium 4.8; Sodium 147  Recent Lipid Panel    Component Value Date/Time   CHOL 135 11/30/2022 0844   CHOL 117 12/21/2019 1006   TRIG 65 11/30/2022 0844   HDL 38 (L) 11/30/2022 0844   HDL 44 12/21/2019 1006   CHOLHDL 3.6 11/30/2022 0844   VLDL 13 11/30/2022 0844   LDLCALC 84 11/30/2022 0844   LDLCALC 55 12/21/2019 1006    Physical Exam:    VS:  BP (!) 144/68 (BP Location: Right Arm, Patient Position: Sitting)   Pulse 60   Ht 5' 9 (1.753 m)   Wt 213 lb (96.6 kg)   SpO2 97%   BMI 31.45 kg/m     Wt Readings from Last 3 Encounters:  02/28/23 213 lb (96.6 kg)  12/13/22 214 lb (97.1 kg)  12/05/22 210 lb 8 oz (95.5 kg)     GEN:  Well nourished, well developed  in no acute distress HEENT: Normal NECK: No JVD; No carotid bruits LYMPHATICS: No lymphadenopathy CARDIAC: RRR, no murmurs, no rubs, no gallops RESPIRATORY:  Clear to auscultation without rales, wheezing or rhonchi  ABDOMEN: Soft, non-tender, non-distended MUSCULOSKELETAL:  No edema; No deformity  SKIN: Warm and dry LOWER EXTREMITIES: no swelling NEUROLOGIC:  Alert and oriented x 3 PSYCHIATRIC:  Normal affect   ASSESSMENT:    1. Coronary artery disease involving native coronary artery of native heart without angina pectoris   2. Chronic systolic CHF (congestive heart failure) (HCC)   3. Paroxysmal atrial fibrillation (HCC)  4. Pure hypercholesterolemia    PLAN:    In order of problems listed above:  Coronary disease stable from that point review without any issues no chest pain tightness squeezing pressure burning chest continue with modifications. Chronic diastolic congestive heart failure he is compensated.  I will ask him to cut down his Lasix  from 40-20, will watch his weight. Dyslipidemia I did review K PN he is LDL 84 HDL 38 not at target but that was done around he is being sick I will repeat fasting lipid profile again.   Medication Adjustments/Labs and Tests Ordered: Current medicines are reviewed at length with the patient today.  Concerns regarding medicines are outlined above.  No orders of the defined types were placed in this encounter.  Medication changes:  Meds ordered this encounter  Medications   DISCONTD: furosemide  (LASIX ) 40 MG tablet    Sig: Take 0.5 tablets (20 mg total) by mouth daily.    Dispense:  90 tablet    Refill:  3   carvedilol  (COREG ) 25 MG tablet    Sig: Take 1 tablet (25 mg total) by mouth 2 (two) times daily.    Dispense:  180 tablet    Refill:  3   DISCONTD: furosemide  (LASIX ) 40 MG tablet    Sig: Take 0.5 tablets (20 mg total) by mouth daily.    Dispense:  90 tablet    Refill:  3   furosemide  (LASIX ) 40 MG tablet    Sig: Take 0.5  tablets (20 mg total) by mouth daily.    Dispense:  90 tablet    Refill:  3    Signed, Lamar DOROTHA Fitch, MD, [facility] 02/28/2023 12:54 PM    [facility]

## 2023-02-28 NOTE — Patient Instructions (Addendum)
 Medication Instructions:   DECREASE: Lasix  20mg  daily   Lab Work: None Ordered If you have labs (blood work) drawn today and your tests are completely normal, you will receive your results only by: MyChart Message (if you have MyChart) OR A paper copy in the mail If you have any lab test that is abnormal or we need to change your treatment, we will call you to review the results.   Testing/Procedures: None Ordered   Follow-Up: At [facility], you and your health needs are our priority.  As part of our continuing mission to provide you with exceptional heart care, we have created designated Provider Care Teams.  These Care Teams include your primary Cardiologist (physician) and Advanced Practice Providers (APPs -  Physician Assistants and Nurse Practitioners) who all work together to provide you with the care you need, when you need it.  We recommend signing up for the patient portal called MyChart.  Sign up information is provided on this After Visit Summary.  MyChart is used to connect with patients for Virtual Visits (Telemedicine).  Patients are able to view lab/test results, encounter notes, upcoming appointments, etc.  Non-urgent messages can be sent to your provider as well.   To learn more about what you can do with MyChart, go to [email or web address].    Your next appointment:   5 month(s)  The format for your next appointment:   In Person  Provider:   Lamar Fitch, MD    Other Instructions NA

## 2023-03-03 ENCOUNTER — Other Ambulatory Visit: Payer: Self-pay

## 2023-03-03 MED ORDER — APIXABAN 5 MG PO TABS: 5.0000 mg | ORAL_TABLET | Freq: Two times a day (BID) | ORAL | 0 refills | Status: DC

## 2023-03-03 NOTE — Telephone Encounter (Signed)
 Spoke with Magnus Ivan, aware I will bring Eliquis samples to [facility] office for pick up.

## 2023-03-04 ENCOUNTER — Telehealth: Payer: Self-pay | Admitting: Cardiology

## 2023-03-04 NOTE — Telephone Encounter (Signed)
 Pt c/o medication issue:  1. Name of Medication:   apixaban  (ELIQUIS ) 5 MG TABS tablet   2. How are you currently taking this medication (dosage and times per day)?   As prescribed  3. Are you having a reaction (difficulty breathing--STAT)?   4. What is your medication issue?   Wife Clayborn) stated she will stop by tomorrow to pick up patient's medication.

## 2023-04-01 ENCOUNTER — Other Ambulatory Visit (HOSPITAL_COMMUNITY): Payer: Self-pay

## 2023-04-24 ENCOUNTER — Telehealth: Payer: Self-pay | Admitting: Cardiology

## 2023-04-24 MED ORDER — ROSUVASTATIN CALCIUM 20 MG PO TABS: 20.0000 mg | ORAL_TABLET | Freq: Every day | ORAL | 3 refills | Status: DC

## 2023-04-24 MED ORDER — EZETIMIBE 10 MG PO TABS: 10.0000 mg | ORAL_TABLET | Freq: Every day | ORAL | 3 refills | Status: DC

## 2023-04-24 NOTE — Telephone Encounter (Signed)
 RX sent

## 2023-04-24 NOTE — Telephone Encounter (Signed)
*  STAT* If patient is at the pharmacy, call can be transferred to refill team.   1. Which medications need to be refilled? (please list name of each medication and dose if known)  ezetimibe (ZETIA) 10 MG tablet  rosuvastatin (CRESTOR) 20 MG tablet  2. Which pharmacy/location (including street and city if local pharmacy) is medication to be sent to? [id number] [place] [place] - [address] Phone: [phone]  Fax: [phone]     3. Do they need a 30 day or 90 day supply? 90

## 2023-05-27 ENCOUNTER — Telehealth: Payer: Self-pay | Admitting: Cardiology

## 2023-05-27 NOTE — Telephone Encounter (Signed)
 Calling to get assistant with medication. Please advise

## 2023-05-28 ENCOUNTER — Telehealth: Payer: Self-pay | Admitting: Pharmacy Technician

## 2023-05-28 ENCOUNTER — Other Ambulatory Visit (HOSPITAL_COMMUNITY): Payer: Self-pay

## 2023-05-28 NOTE — Telephone Encounter (Signed)
 Message to [place]:  Pt's wife Jeffery Barr per DPR stated that they received a denial letter for Eliquis in March. They are wanting to know if there is anything else they can do to get assistance. TY Dede    Reached out to patient: I am reaching out about the Eliquis copay. It appears there was a deductible to meet and upon running a test claim it is now $47.00 a month. Unless you have spent enough to meet the out of pocket expense report criteria, then it will be denied.  The only option is to call the number on the back of the card and check into the payment plans. Each one varies by plan

## 2023-06-10 ENCOUNTER — Telehealth: Payer: Self-pay | Admitting: Cardiology

## 2023-06-10 MED ORDER — CARVEDILOL 25 MG PO TABS: 25.0000 mg | ORAL_TABLET | Freq: Two times a day (BID) | ORAL | 2 refills | Status: DC

## 2023-06-10 MED ORDER — FUROSEMIDE 20 MG PO TABS: 20.0000 mg | ORAL_TABLET | Freq: Every day | ORAL | 2 refills | Status: DC

## 2023-06-10 NOTE — Telephone Encounter (Signed)
 Jeffery Barr is requesting samples of Eliquis  and requesting an update on pt assistance.  Jeffery Barr aware that RX has been sent to the pharmacy.

## 2023-06-10 NOTE — Telephone Encounter (Signed)
*  STAT* If patient is at the pharmacy, call can be transferred to refill team.   1. Which medications need to be refilled? (please list name of each medication and dose if known) new prescriptions for Furosemide  20 mg and Carvedilol  25 mg  2. Would you like to learn more about the convenience, safety, & potential cost savings by using the [facility]?     3. Are you open to using the [facility] (Type [facility]. .   4. Which pharmacy/location (including street and city if local pharmacy) [facility]    5. Do they need a 30 day or 90 day supply? 90 days and refills     Patient calling the office for samples of medication:     1.  What medication and dosage are you requesting samples for? Eliquis   2.  Are you currently out of this medication? Some- waiting on his patient assistance  for his Eliquis

## 2023-06-11 ENCOUNTER — Telehealth: Payer: Self-pay

## 2023-06-11 MED ORDER — APIXABAN 5 MG PO TABS: 5.0000 mg | ORAL_TABLET | Freq: Two times a day (BID) | ORAL | Status: DC

## 2023-06-11 NOTE — Telephone Encounter (Signed)
 Called pt to let them know we have samples at the [facility] Eliquis  5mg  1 tablet Bid #[id number]

## 2023-06-16 ENCOUNTER — Other Ambulatory Visit (HOSPITAL_COMMUNITY): Payer: Self-pay

## 2023-06-16 MED ORDER — APIXABAN 5 MG PO TABS: 5.0000 mg | ORAL_TABLET | Freq: Two times a day (BID) | ORAL | 0 refills | Status: DC

## 2023-06-16 NOTE — Telephone Encounter (Signed)
 Patient should be aware of the BMS denial from March. We reached out to patient again earlier in the month via mychart "I am reaching out about the Eliquis  copay. It appears there was a deductible to meet and upon running a test claim it is now $47.00 a month. Unless you have spent enough to meet the out of pocket expense report criteria, then it will be denied. The only option is to call the number on the back of the card and check into the payment plans. Each one varies by plan." Patient doesn't qualify for patient assistance and should utilize medicare payment plan if they can't afford the $47 copay.

## 2023-06-16 NOTE — Telephone Encounter (Signed)
 Spoke with Aisha Hove, notified of the following below. Encourage to use ou pharmacy off of [address] [place].

## 2023-06-16 NOTE — Telephone Encounter (Signed)
 Left a message informing Jeffery Barr samples ready for pick, Rx was sent, and sending message to Med assistance team for an update and to follow up with patient.

## 2023-06-16 NOTE — Telephone Encounter (Signed)
 Jeffery Barr returning call, requesting cb

## 2023-06-26 ENCOUNTER — Encounter: Payer: Self-pay | Admitting: Cardiology

## 2023-06-30 ENCOUNTER — Encounter: Payer: Self-pay | Admitting: Cardiology

## 2023-06-30 ENCOUNTER — Ambulatory Visit: Payer: PRIVATE HEALTH INSURANCE | Attending: Cardiology | Admitting: Cardiology

## 2023-06-30 VITALS — BP 130/74 | HR 84 | Ht 69.0 in | Wt 223.0 lb

## 2023-06-30 DIAGNOSIS — I2510 Atherosclerotic heart disease of native coronary artery without angina pectoris: Secondary | ICD-10-CM | POA: Diagnosis not present

## 2023-06-30 DIAGNOSIS — I10 Essential (primary) hypertension: Secondary | ICD-10-CM | POA: Diagnosis not present

## 2023-06-30 DIAGNOSIS — E7800 Pure hypercholesterolemia, unspecified: Secondary | ICD-10-CM | POA: Diagnosis not present

## 2023-06-30 DIAGNOSIS — I480 Paroxysmal atrial fibrillation: Secondary | ICD-10-CM | POA: Diagnosis not present

## 2023-06-30 MED ORDER — ROSUVASTATIN CALCIUM 40 MG PO TABS: 40.0000 mg | ORAL_TABLET | Freq: Every day | ORAL | 3 refills | Status: DC

## 2023-06-30 NOTE — Addendum Note (Signed)
 Addended by: Aurelio Leer I on: 06/30/2023 11:12 AM   Modules accepted: Orders

## 2023-06-30 NOTE — Patient Instructions (Signed)
 Medication Instructions:  Your physician has recommended you make the following change in your medication:   START: Rosuvastatin  40 mg daily  *If you need a refill on your cardiac medications before your next appointment, please call your pharmacy*  Lab Work: Your physician recommends that you return for lab work in:   Labs in 6 weeks: Lipid, LFT  If you have labs (blood work) drawn today and your tests are completely normal, you will receive your results only by: MyChart Message (if you have MyChart) OR A paper copy in the mail If you have any lab test that is abnormal or we need to change your treatment, we will call you to review the results.  Testing/Procedures: None  Follow-Up: At [facility], you and your health needs are our priority.  As part of our continuing mission to provide you with exceptional heart care, our providers are all part of one team.  This team includes your primary Cardiologist (physician) and Advanced Practice Providers or APPs (Physician Assistants and Nurse Practitioners) who all work together to provide you with the care you need, when you need it.  Your next appointment:   6 month(s)  Provider:   Ralene Burger, MD    We recommend signing up for the patient portal called "MyChart".  Sign up information is provided on this After Visit Summary.  MyChart is used to connect with patients for Virtual Visits (Telemedicine).  Patients are able to view lab/test results, encounter notes, upcoming appointments, etc.  Non-urgent messages can be sent to your provider as well.   To learn more about what you can do with MyChart, go to [email or web address].   Other Instructions None

## 2023-06-30 NOTE — Progress Notes (Signed)
 Cardiology Office Note:    Date:  06/30/2023   ID:  Jeffery Barr, DOB [birth year], MRN [id number]  PCP:  Wyn Heater, MD  Cardiologist:  Ralene Burger, MD    Referring MD: Wyn Heater, MD   Chief Complaint  Patient presents with   Follow-up    History of Present Illness:    Jeffery Barr is a 83 y.o. male medical history significant for coronary disease, status post coronary bypass graft done many years ago, dyslipidemia, essential hypertension, paroxysmal atrial fibrillation now he is anticoagulated.  Comes today 2 months for follow-up overall doing well.  Denies have any chest pain tightness squeezing pressure burning chest, he does what he wants to do with no difficulties  Past Medical History:  Diagnosis Date   Acute hypoxic respiratory failure (HCC) 11/30/2022   Acute kidney injury superimposed on chronic kidney disease (HCC) 12/04/2022   Acute on chronic diastolic heart failure (HCC) 11/30/2022   Acute respiratory failure with hypoxia (HCC) 07/22/2021   Atrial fibrillation with RVR (HCC) 11/30/2022   Benign essential hypertension 08/27/2021   Benign hypertensive heart disease without heart failure    CAD (coronary artery disease) 11/26/2012   Chronic systolic CHF (congestive heart failure) (HCC) 07/22/2021   Coronary atherosclerosis of native coronary artery    S/P CABG   Dyslipidemia    Generalized weakness 07/22/2021   HTN (hypertension)    Hypoxic respiratory failure (HCC) 11/30/2022   NSTEMI (non-ST elevated myocardial infarction) (HCC) 11/30/2022   Obesity    Obesity (BMI 30-39.9) 11/26/2012   Obesity, class 1 12/04/2022   Other thrombophilia (HCC) 08/27/2021   Paroxysmal atrial fibrillation (HCC) 07/22/2021   Persistent atrial fibrillation (HCC) 08/27/2021   Pneumonia due to COVID-19 virus 07/21/2021   Pure hypercholesterolemia 08/27/2021   Sepsis secondary to UTI (HCC) 11/30/2022   Severe sepsis (HCC) 11/30/2022   Sinus bradycardia    a.  baseline HR 50s.   TIA (transient ischemic attack) 05/30/2021   Transaminitis 07/22/2021    Past Surgical History:  Procedure Laterality Date   CORONARY ARTERY BYPASS GRAFT     VASECTOMY      Current Medications: Current Meds  Medication Sig   amLODipine  (NORVASC ) 5 MG tablet Take 1 tablet (5 mg total) by mouth daily.   apixaban  (ELIQUIS ) 5 MG TABS tablet Take 1 tablet (5 mg total) by mouth 2 (two) times daily.   apixaban  (ELIQUIS ) 5 MG TABS tablet Take 1 tablet (5 mg total) by mouth 2 (two) times daily.   aspirin  EC 81 MG tablet Take 81 mg by mouth daily. Swallow whole.   carvedilol  (COREG ) 25 MG tablet Take 1 tablet (25 mg total) by mouth 2 (two) times daily.   ezetimibe  (ZETIA ) 10 MG tablet Take 1 tablet (10 mg total) by mouth daily.   furosemide  (LASIX ) 20 MG tablet Take 1 tablet (20 mg total) by mouth daily.   rosuvastatin  (CRESTOR ) 20 MG tablet Take 1 tablet (20 mg total) by mouth daily.     Allergies:   Patient has no known allergies.   Social History   Socioeconomic History   Marital status: Married    Spouse name: Not on file   Number of children: Not on file   Years of education: Not on file   Highest education level: Not on file  Occupational History   Not on file  Tobacco Use   Smoking status: Former   Smokeless tobacco: Never  Vaping Use   Vaping status: Never Used  Substance and Sexual Activity   Alcohol use: Yes    Alcohol/week: 3.0 - 5.0 standard drinks of alcohol    Types: 1 - 2 Glasses of wine, 1 - 2 Cans of beer, 1 Shots of liquor per week    Comment: 1-2 glasses of wine and beer weekly   Drug use: No   Sexual activity: Not on file  Other Topics Concern   Not on file  Social History Narrative   Right handed   Lives with wife,   One story home,Retired.   Social Drivers of Corporate investment banker Strain: Not on file  Food Insecurity: No Food Insecurity (11/30/2022)   Hunger Vital Sign    Worried About Running Out of Food in the Last  Year: Never true    Ran Out of Food in the Last Year: Never true  Transportation Needs: No Transportation Needs (11/30/2022)   PRAPARE - Administrator, Civil Service (Medical): No    Lack of Transportation (Non-Medical): No  Physical Activity: Not on file  Stress: Not on file  Social Connections: Not on file     Family History: The patient's family history includes CAD in his mother, sister, and another family member; Heart attack in an other family member; Heart disease in his brother; Heart failure in his mother. ROS:   Please see the history of present illness.    All 14 point review of systems negative except as described per history of present illness  EKGs/Labs/Other Studies Reviewed:         Recent Labs: 11/29/2022: ALT 16 12/03/2022: B Natriuretic Peptide 1,901.8; Hemoglobin 12.7; Platelets 211 12/04/2022: Magnesium 2.1 12/13/2022: NT-Pro BNP 611 12/24/2022: BUN 20; Creatinine, Ser 1.23; Potassium 4.8; Sodium 147  Recent Lipid Panel    Component Value Date/Time   CHOL 135 11/30/2022 0844   CHOL 117 12/21/2019 1006   TRIG 65 11/30/2022 0844   HDL 38 (L) 11/30/2022 0844   HDL 44 12/21/2019 1006   CHOLHDL 3.6 11/30/2022 0844   VLDL 13 11/30/2022 0844   LDLCALC 84 11/30/2022 0844   LDLCALC 55 12/21/2019 1006    Physical Exam:    VS:  BP 130/74 (BP Location: Right Arm, Patient Position: Sitting)   Pulse 84   Ht 5\' 9"  (1.753 m)   Wt 223 lb (101.2 kg)   SpO2 94%   BMI 32.93 kg/m     Wt Readings from Last 3 Encounters:  06/30/23 223 lb (101.2 kg)  02/28/23 213 lb (96.6 kg)  12/13/22 214 lb (97.1 kg)     GEN:  Well nourished, well developed in no acute distress HEENT: Normal NECK: No JVD; No carotid bruits LYMPHATICS: No lymphadenopathy CARDIAC: RRR, no murmurs, no rubs, no gallops RESPIRATORY:  Clear to auscultation without rales, wheezing or rhonchi  ABDOMEN: Soft, non-tender, non-distended MUSCULOSKELETAL:  No edema; No deformity  SKIN:  Warm and dry LOWER EXTREMITIES: no swelling NEUROLOGIC:  Alert and oriented x 3 PSYCHIATRIC:  Normal affect   ASSESSMENT:    1. Coronary artery disease involving native coronary artery of native heart without angina pectoris   2. Paroxysmal atrial fibrillation (HCC)   3. Pure hypercholesterolemia   4. Benign essential hypertension    PLAN:    In order of problems listed above:  Coronary disease stable on appropriate guideline directed medical therapy which I will continue. Paroxysmal atrial fibrillation, 10 sinus rhythm, continue anticoagulation. Dyslipidemia I did review K PN which show me LDL 84 HDL 38.  Will double the dose of Crestor  to 40 mg daily continue with Zetia  will check fasting lipid profile in 6 weeks. Essential hypertension blood pressure well-controlled   Medication Adjustments/Labs and Tests Ordered: Current medicines are reviewed at length with the patient today.  Concerns regarding medicines are outlined above.  No orders of the defined types were placed in this encounter.  Medication changes: No orders of the defined types were placed in this encounter.   Signed, Manfred Seed, MD, [facility] 06/30/2023 11:00 AM    [facility]

## 2023-07-08 ENCOUNTER — Other Ambulatory Visit: Payer: Self-pay | Admitting: Cardiology

## 2023-08-08 ENCOUNTER — Telehealth: Payer: Self-pay | Admitting: Cardiology

## 2023-08-08 NOTE — Telephone Encounter (Signed)
 Patient calling the office for samples of medication:   1.  What medication and dosage are you requesting samples for?   apixaban (ELIQUIS) 5 MG TABS tablet    2.  Are you currently out of this medication? Not yet

## 2023-08-08 NOTE — Telephone Encounter (Signed)
 Spoke with spouse. Asdvised that there will be someone in the [facility] office on Wednesday and she could go by and see if they have samples. She verbalized understanding and had no further questions.

## 2023-08-08 NOTE — Telephone Encounter (Signed)
 Patient identification verified by 2 forms. Jeffery Duck, RN     Called and spoke to patient spouse Jeffery Barr.               Interventions/Plan: - Patient has enough medication for next few days and would like to pick up samples from office in [place] not [place]. Forwarding message back to [facility] triage for f/u on Monday.   Patient agrees with plan, no questions at this time

## 2023-08-13 DIAGNOSIS — I2510 Atherosclerotic heart disease of native coronary artery without angina pectoris: Secondary | ICD-10-CM | POA: Diagnosis not present

## 2023-08-13 DIAGNOSIS — E7800 Pure hypercholesterolemia, unspecified: Secondary | ICD-10-CM | POA: Diagnosis not present

## 2023-08-13 DIAGNOSIS — I480 Paroxysmal atrial fibrillation: Secondary | ICD-10-CM | POA: Diagnosis not present

## 2023-08-13 DIAGNOSIS — I10 Essential (primary) hypertension: Secondary | ICD-10-CM | POA: Diagnosis not present

## 2023-08-14 LAB — LIPID PANEL
Chol/HDL Ratio: 2.2 ratio (ref 0.0–5.0)
Cholesterol, Total: 97 mg/dL — ABNORMAL LOW (ref 100–199)
HDL: 44 mg/dL (ref >39)
LDL Chol Calc (NIH): 36 mg/dL (ref 0–99)
Triglycerides: 83 mg/dL (ref 0–149)
VLDL Cholesterol Cal: 17 mg/dL (ref 5–40)

## 2023-08-14 LAB — HEPATIC FUNCTION PANEL
ALT: 33 IU/L (ref 0–44)
AST: 32 IU/L (ref 0–40)
Albumin: 4.3 g/dL (ref 3.7–4.7)
Alkaline Phosphatase: 58 IU/L (ref 44–121)
Bilirubin Total: 0.7 mg/dL (ref 0.0–1.2)
Bilirubin, Direct: 0.27 mg/dL (ref 0.00–0.40)
Total Protein: 6.6 g/dL (ref 6.0–8.5)

## 2023-08-19 ENCOUNTER — Ambulatory Visit: Payer: Self-pay | Admitting: Cardiology

## 2023-08-20 ENCOUNTER — Telehealth: Payer: Self-pay

## 2023-08-20 NOTE — Telephone Encounter (Signed)
 Left message on My Chart with Lipid results per Dr. Karry note. Routed to PCP.

## 2023-09-25 ENCOUNTER — Other Ambulatory Visit: Payer: Self-pay

## 2023-09-25 ENCOUNTER — Telehealth: Payer: Self-pay | Admitting: Cardiology

## 2023-09-25 DIAGNOSIS — I480 Paroxysmal atrial fibrillation: Secondary | ICD-10-CM

## 2023-09-25 MED ORDER — APIXABAN 5 MG PO TABS: 5.0000 mg | ORAL_TABLET | Freq: Two times a day (BID) | ORAL | 1 refills | Status: DC

## 2023-09-25 MED ORDER — APIXABAN 5 MG PO TABS: 5.0000 mg | ORAL_TABLET | Freq: Two times a day (BID) | ORAL | 0 refills | Status: DC

## 2023-09-25 NOTE — Telephone Encounter (Signed)
 Called the patient's wife and informed her of the options from the precription assistance team to help them be able to afford the patient's Eliquis  below:  Unfortunately, all the PAP options for DOAC's require patient to meet a 3-4% out of pocket spending on drugs for 2025 based on their annual income. Patient won't meet any PAP requirements until they have paid some towards their deductible. Patient would likely benefit from setting up a Medicare payment plan with their insurance. Patient can contact the customer service number on the back of their insurance card and request to setup a payment plan where the deductible would be split up into payments over time.   Patient's wife verbalized understanding and stated that she would call their insurance and ask about the Medicare payment plan to help with their deductible. Patient had no further questions at this time.

## 2023-09-25 NOTE — Telephone Encounter (Signed)
 Called the patient and spoke to his wife and she reported that they are unable to afford his Eliquis  medication. I explained that I could provide them with samples and then also send a message to our prescription assistance team to see if they can help reduce the cost of the Eliquis  for the patient. Patient verbalized understanding and had no further questions at this time. Eliquis  medication samples were aside for the patient and a message was sent to the prescription assistance team to see if they had any programs that would help the patient be able to afford his Eliquis  medication.

## 2023-09-25 NOTE — Telephone Encounter (Signed)
 Pt c/o medication issue:  1. [id number] [place], [place] - [address]   2. How are you currently taking this medication (dosage and times per day)?    3. Are you having a reaction (difficulty breathing--STAT)? no  4. What is your medication issue? Wife is calling to speak to the nurse to see if if the cost of medication can be lower. Please advise

## 2023-09-25 NOTE — Telephone Encounter (Signed)
 Prescription refill request for Eliquis  received. Indication: Afib  Last office visit: 06/30/23 [place])  Scr: 1.23 (12/24/22)  Age: 83 Weight: 101.2kg  Appropriate dose. Refill sent.

## 2023-09-25 NOTE — Telephone Encounter (Signed)
*  STAT* If patient is at the pharmacy, call can be transferred to refill team.   1. Which medications need to be refilled? (please list name of each medication and dose if known)   apixaban  (ELIQUIS ) 5 MG TABS tablet (Expired)    2. Which pharmacy/location (including street and city if local pharmacy) is medication to be sent to? [id number] [place], [place] - [address]   3. Do they need a 30 day or 90 day supply? 90

## 2023-09-25 NOTE — Telephone Encounter (Signed)
 Unfortunately, all the PAP options for DOAC's require patient to meet a 3-4% out of pocket spending on drugs for 2025 based on their annual income. Patient won't meet any PAP requirements until they have paid some towards their deductible. Patient would likely benefit from setting up a Medicare payment plan with their insurance. Patient can contact the customer service number on the back of their insurance card and request to setup a payment plan where the deductible would be split up into payments over time.

## 2023-10-28 DIAGNOSIS — H2513 Age-related nuclear cataract, bilateral: Secondary | ICD-10-CM | POA: Diagnosis not present

## 2024-01-01 DIAGNOSIS — H18413 Arcus senilis, bilateral: Secondary | ICD-10-CM | POA: Diagnosis not present

## 2024-01-01 DIAGNOSIS — H11041 Peripheral pterygium, stationary, right eye: Secondary | ICD-10-CM | POA: Diagnosis not present

## 2024-01-01 DIAGNOSIS — H2511 Age-related nuclear cataract, right eye: Secondary | ICD-10-CM | POA: Diagnosis not present

## 2024-01-01 DIAGNOSIS — H25043 Posterior subcapsular polar age-related cataract, bilateral: Secondary | ICD-10-CM | POA: Diagnosis not present

## 2024-01-01 DIAGNOSIS — H2513 Age-related nuclear cataract, bilateral: Secondary | ICD-10-CM | POA: Diagnosis not present

## 2024-03-11 ENCOUNTER — Encounter: Payer: Self-pay | Admitting: *Deleted

## 2024-03-11 DIAGNOSIS — D6859 Other primary thrombophilia: Secondary | ICD-10-CM | POA: Insufficient documentation

## 2024-03-16 ENCOUNTER — Other Ambulatory Visit: Payer: Self-pay | Admitting: *Deleted

## 2024-03-17 ENCOUNTER — Ambulatory Visit: Payer: PRIVATE HEALTH INSURANCE | Attending: Cardiology | Admitting: Cardiology

## 2024-03-17 ENCOUNTER — Encounter: Payer: Self-pay | Admitting: Cardiology

## 2024-03-17 VITALS — BP 150/66 | HR 53 | Ht 69.0 in | Wt 222.0 lb

## 2024-03-17 DIAGNOSIS — I2510 Atherosclerotic heart disease of native coronary artery without angina pectoris: Secondary | ICD-10-CM | POA: Insufficient documentation

## 2024-03-17 DIAGNOSIS — I10 Essential (primary) hypertension: Secondary | ICD-10-CM | POA: Diagnosis present

## 2024-03-17 DIAGNOSIS — E7800 Pure hypercholesterolemia, unspecified: Secondary | ICD-10-CM | POA: Insufficient documentation

## 2024-03-17 DIAGNOSIS — I480 Paroxysmal atrial fibrillation: Secondary | ICD-10-CM | POA: Diagnosis present

## 2024-03-17 DIAGNOSIS — I5022 Chronic systolic (congestive) heart failure: Secondary | ICD-10-CM | POA: Insufficient documentation

## 2024-03-17 DIAGNOSIS — R0609 Other forms of dyspnea: Secondary | ICD-10-CM | POA: Diagnosis present

## 2024-03-17 MED ORDER — CARVEDILOL 25 MG PO TABS: 25.0000 mg | ORAL_TABLET | Freq: Two times a day (BID) | ORAL | 3 refills | Status: AC

## 2024-03-17 MED ORDER — APIXABAN 5 MG PO TABS: 5.0000 mg | ORAL_TABLET | Freq: Two times a day (BID) | ORAL | 3 refills | Status: AC

## 2024-03-17 MED ORDER — ROSUVASTATIN CALCIUM 40 MG PO TABS: 40.0000 mg | ORAL_TABLET | Freq: Every day | ORAL | 3 refills | Status: AC

## 2024-03-17 MED ORDER — AMLODIPINE BESYLATE 5 MG PO TABS: 5.0000 mg | ORAL_TABLET | Freq: Every day | ORAL | 3 refills | Status: AC

## 2024-03-17 MED ORDER — EZETIMIBE 10 MG PO TABS: 10.0000 mg | ORAL_TABLET | Freq: Every day | ORAL | 3 refills | Status: AC

## 2024-03-17 MED ORDER — FUROSEMIDE 20 MG PO TABS: 20.0000 mg | ORAL_TABLET | Freq: Every day | ORAL | 3 refills | Status: AC

## 2024-03-17 NOTE — Patient Instructions (Signed)

## 2024-03-17 NOTE — Progress Notes (Signed)
 " Cardiology Office Note:    Date:  03/17/2024   ID:  Jeffery Barr, DOB 12-14-1940, MRN [id number]  PCP:  [name], MD  Cardiologist:  Lamar Fitch, MD    Referring MD: [name], MD   Chief Complaint  Patient presents with   Follow-up    History of Present Illness:     Jeffery Barr is a 84 y.o. male past medical history significant for coronary artery disease status post coronary bypass grafting many years ago, dyslipidemia, essential hypertension, paroxysmal atrial fibrillation now he is anticoagulated.  Comes today to months for follow-up overall doing fine denies any chest pain tightness squeezing pressure burning chest but admits he does not do much.  He did notice sometimes swelling of lower extremities specially evening time.  No paroxysmal nocturnal dyspnea no palpitations  Past Medical History:  Diagnosis Date   Acute hypoxic respiratory failure (HCC) 11/30/2022   Acute kidney injury superimposed on chronic kidney disease 12/04/2022   Acute on chronic diastolic heart failure (HCC) 11/30/2022   Acute respiratory failure with hypoxia (HCC) 07/22/2021   Atrial fibrillation with RVR (HCC) 11/30/2022   Benign essential hypertension 08/27/2021   Benign hypertensive heart disease without heart failure    CAD (coronary artery disease) 11/26/2012   Chronic systolic CHF (congestive heart failure) (HCC) 07/22/2021   Coronary atherosclerosis of native coronary artery    S/P CABG   Dyslipidemia    Generalized weakness 07/22/2021   HTN (hypertension)    Hypoxic respiratory failure (HCC) 11/30/2022   NSTEMI (non-ST elevated myocardial infarction) (HCC) 11/30/2022   Obesity    Obesity (BMI 30-39.9) 11/26/2012   Obesity, class 1 12/04/2022   Other thrombophilia 08/27/2021   Paroxysmal atrial fibrillation (HCC) 07/22/2021   Persistent atrial fibrillation (HCC) 08/27/2021   Pneumonia due to COVID-19 virus 07/21/2021   Pure hypercholesterolemia 08/27/2021    Sepsis secondary to UTI (HCC) 11/30/2022   Severe sepsis (HCC) 11/30/2022   Sinus bradycardia    a. baseline HR 50s.   TIA (transient ischemic attack) 05/30/2021   Transaminitis 07/22/2021    Past Surgical History:  Procedure Laterality Date   CORONARY ARTERY BYPASS GRAFT     VASECTOMY      Current Medications: Active Medications[1]   Allergies:   Patient has no known allergies.   Social History   Socioeconomic History   Marital status: Married    Spouse name: Not on file   Number of children: Not on file   Years of education: Not on file   Highest education level: Not on file  Occupational History   Not on file  Tobacco Use   Smoking status: Former   Smokeless tobacco: Never  Vaping Use   Vaping status: Never Used  Substance and Sexual Activity   Alcohol use: Yes    Alcohol/week: 3.0 - 5.0 standard drinks of alcohol    Types: 1 - 2 Glasses of wine, 1 - 2 Cans of beer, 1 Shots of liquor per week    Comment: 1-2 glasses of wine and beer weekly   Drug use: No   Sexual activity: Not on file  Other Topics Concern   Not on file  Social History Narrative   Right handed   Lives with wife,   One story home,Retired.   Social Drivers of Health   Tobacco Use: Medium Risk (03/17/2024)   Patient History    Smoking Tobacco Use: Former    Smokeless Tobacco Use: Never    Passive Exposure:  Not on file  Financial Resource Strain: Not on file  Food Insecurity: No Food Insecurity (11/30/2022)   Hunger Vital Sign    Worried About Running Out of Food in the Last Year: Never true    Ran Out of Food in the Last Year: Never true  Transportation Needs: No Transportation Needs (11/30/2022)   PRAPARE - Administrator, Civil Service (Medical): No    Lack of Transportation (Non-Medical): No  Physical Activity: Not on file  Stress: Not on file  Social Connections: Not on file  Depression (EYV7-0): Not on file  Alcohol Screen: Not on file  Housing: Low Risk (11/30/2022)    Housing    Last Housing Risk Score: 0  Utilities: Not At Risk (11/30/2022)   AHC Utilities    Threatened with loss of utilities: No  Health Literacy: Not on file     Family History: The patient's family history includes CAD in his mother, sister, and another family member; Heart attack in an other family member; Heart disease in his brother; Heart failure in his mother. ROS:   Please see the history of present illness.    All 14 point review of systems negative except as described per history of present illness  EKGs/Labs/Other Studies Reviewed:    EKG Interpretation Date/Time:  Wednesday March 17 2024 16:25:58 EST Ventricular Rate:  53 PR Interval:  158 QRS Duration:  96 QT Interval:  432 QTC Calculation: 405 R Axis:   64  Text Interpretation: Sinus bradycardia Low voltage QRS Nonspecific T wave abnormality When compared with ECG of 13-Dec-2022 13:40, Premature supraventricular complexes are no longer Present Nonspecific T wave abnormality now evident in Inferior leads Nonspecific T wave abnormality, worse in Anterolateral leads Confirmed by Bernie Charleston [phone]) on 03/17/2024 4:29:54 PM    Recent Labs: 08/13/2023: ALT 33  Recent Lipid Panel    Component Value Date/Time   CHOL 97 (L) 08/13/2023 1031   TRIG 83 08/13/2023 1031   HDL 44 08/13/2023 1031   CHOLHDL 2.2 08/13/2023 1031   CHOLHDL 3.6 11/30/2022 0844   VLDL 13 11/30/2022 0844   LDLCALC 36 08/13/2023 1031    Physical Exam:    VS:  BP (!) 176/90   Pulse (!) 53   Ht 5' 9 (1.753 m)   Wt 222 lb (100.7 kg)   SpO2 96%   BMI 32.78 kg/m     Wt Readings from Last 3 Encounters:  03/17/24 222 lb (100.7 kg)  06/30/23 223 lb (101.2 kg)  02/28/23 213 lb (96.6 kg)     GEN:  Well nourished, well developed in no acute distress HEENT: Normal NECK: No JVD; No carotid bruits LYMPHATICS: No lymphadenopathy CARDIAC: RRR, no murmurs, no rubs, no gallops RESPIRATORY:  Clear to auscultation without rales,  wheezing or rhonchi  ABDOMEN: Soft, non-tender, non-distended MUSCULOSKELETAL:  No edema; No deformity  SKIN: Warm and dry LOWER EXTREMITIES: no swelling NEUROLOGIC:  Alert and oriented x 3 PSYCHIATRIC:  Normal affect   ASSESSMENT:    1. Paroxysmal atrial fibrillation (HCC)   2. Benign essential hypertension   3. Coronary artery disease involving native coronary artery of native heart without angina pectoris   4. Chronic systolic CHF (congestive heart failure) (HCC)   5. Pure hypercholesterolemia    PLAN:    In order of problems listed above:  Swelling of lower extremities sometimes not observed today on physical exam, will do echocardiogram to reassess left ventricle ejection fraction.  I told him that he  can take extra Lasix  he takes 20 mg of torsemide daily he may take 40 mg daily for up to 3 days a week if swelling happens. Essential hypertension blood pressure elevated will recheck before he get out of the room today, he tells me that he is always elevated in the doctor's office and that is normal at home.  Will continue monitoring. Chronic systolic congestive heart failure again echocardiogram to be done to reassess left ventricle ejection fraction. Dyslipidemia he is taking Crestor , he I did review KPN which show me LDL 36 HDL 44 continue present management   Medication Adjustments/Labs and Tests Ordered: Current medicines are reviewed at length with the patient today.  Concerns regarding medicines are outlined above.  Orders Placed This Encounter  Procedures   EKG 12-Lead   Medication changes: No orders of the defined types were placed in this encounter.   Signed, Lamar DOROTHA Fitch, MD, [facility] 03/17/2024 4:39 PM    [facility]    [1]  Current Meds  Medication Sig   amLODipine  (NORVASC ) 5 MG tablet TAKE 1 TABLET (5 MG TOTAL) BY MOUTH DAILY.   apixaban  (ELIQUIS ) 5 MG TABS tablet Take 1 tablet (5 mg total) by mouth 2 (two) times daily.    aspirin  EC 81 MG tablet Take 81 mg by mouth daily. Swallow whole.   carvedilol  (COREG ) 25 MG tablet Take 1 tablet (25 mg total) by mouth 2 (two) times daily.   ezetimibe  (ZETIA ) 10 MG tablet Take 1 tablet (10 mg total) by mouth daily.   furosemide  (LASIX ) 20 MG tablet Take 1 tablet (20 mg total) by mouth daily.   rosuvastatin  (CRESTOR ) 40 MG tablet Take 1 tablet (40 mg total) by mouth daily.   "

## 2024-03-22 ENCOUNTER — Ambulatory Visit (HOSPITAL_BASED_OUTPATIENT_CLINIC_OR_DEPARTMENT_OTHER)

## 2024-04-19 ENCOUNTER — Ambulatory Visit (HOSPITAL_BASED_OUTPATIENT_CLINIC_OR_DEPARTMENT_OTHER)

## 2024-05-02 IMAGING — CT CT CHEST-ABD-PELV W/ CM
2 of 5 series · 14 of 36 positions shown, 16 images · IV contrast (Omnipaque)
Comparison: Radiograph earlier today

CLINICAL DATA: Acute abdominal pain. None nausea. Cough. COVID
positive

EXAM:
CT CHEST, ABDOMEN, AND PELVIS WITH CONTRAST
TECHNIQUE: Multidetector CT imaging of the chest, abdomen and pelvis was
performed following the standard protocol during bolus
administration of intravenous contrast.

[Series 2: cap with 2 · axial · 0.86mm/px · z∈[-610,-100]mm · 11 of 124 slices shown, 13 images]
[im 11/124  mediastinal]
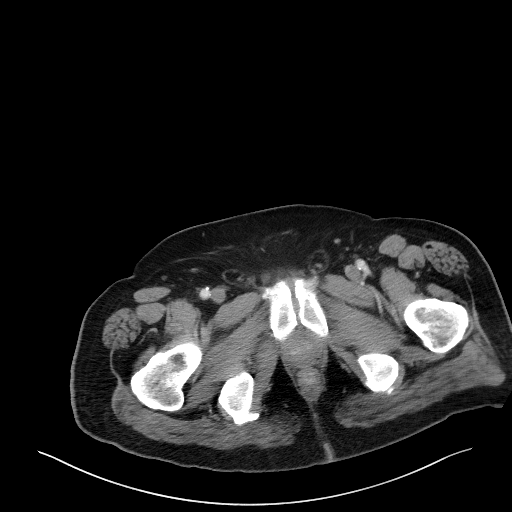
[im 11/124  bone]
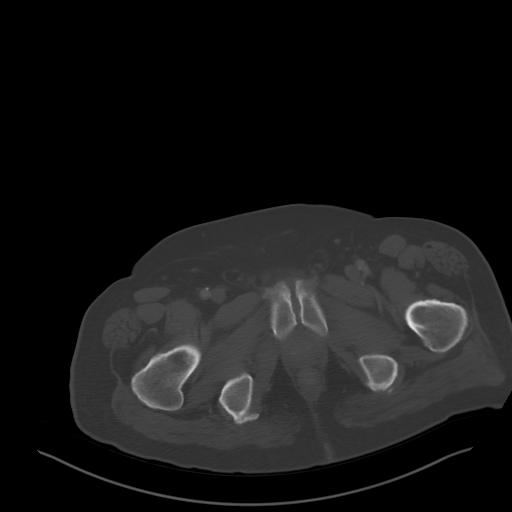
[im 21/124  mediastinal]
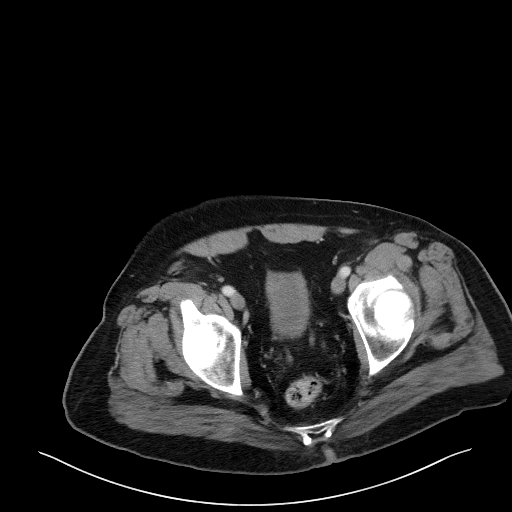
[im 31/124  mediastinal]
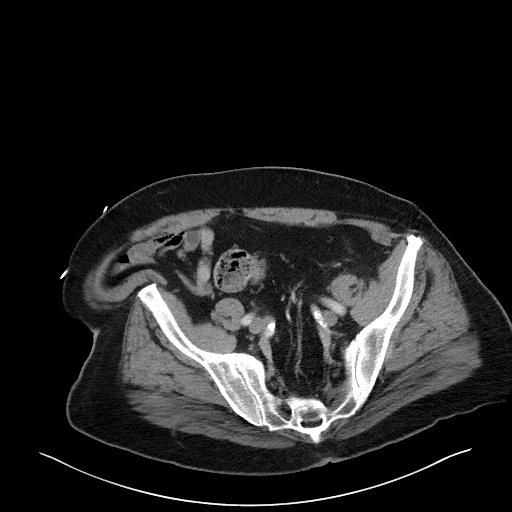
[im 42/124  mediastinal]
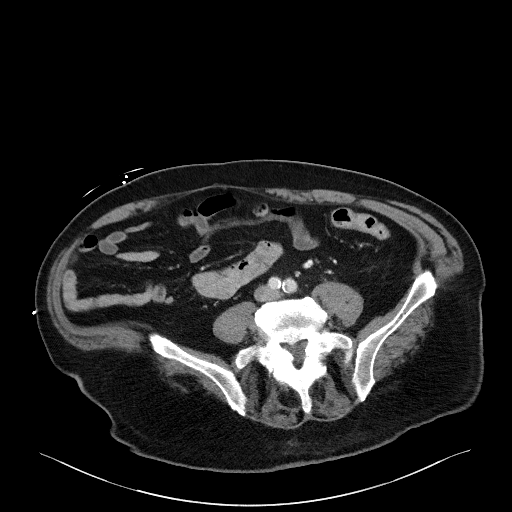
[im 52/124  mediastinal]
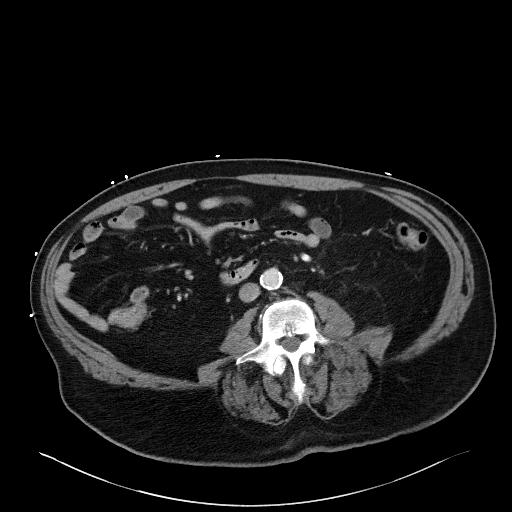
[im 62/124  mediastinal]
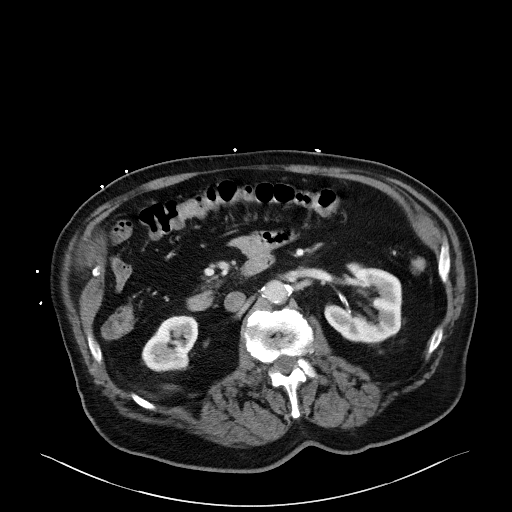
[im 72/124  mediastinal]
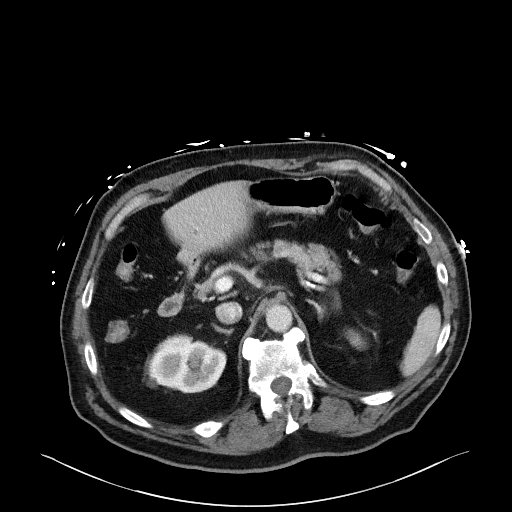
[im 83/124  mediastinal]
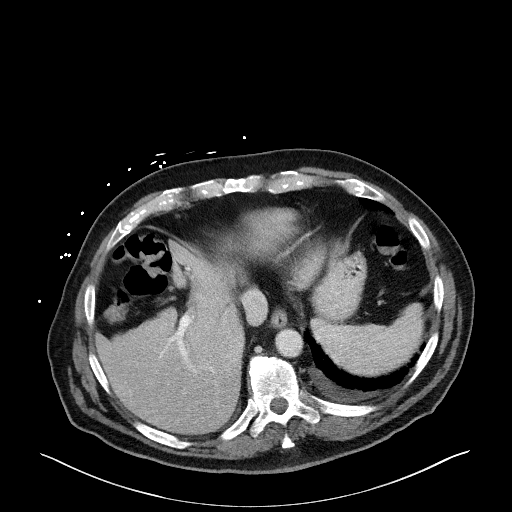
[im 93/124  mediastinal]
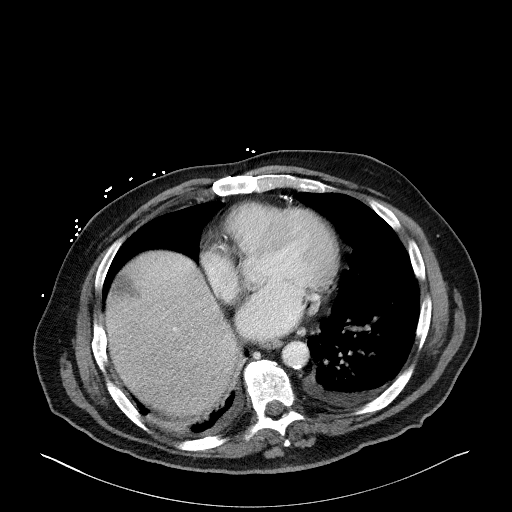
[im 93/124  bone]
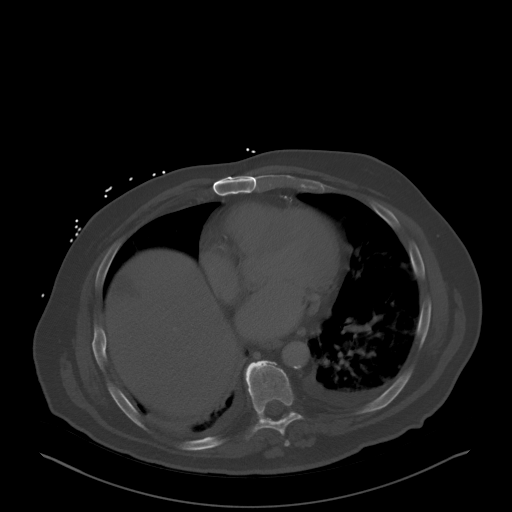
[im 103/124  mediastinal]
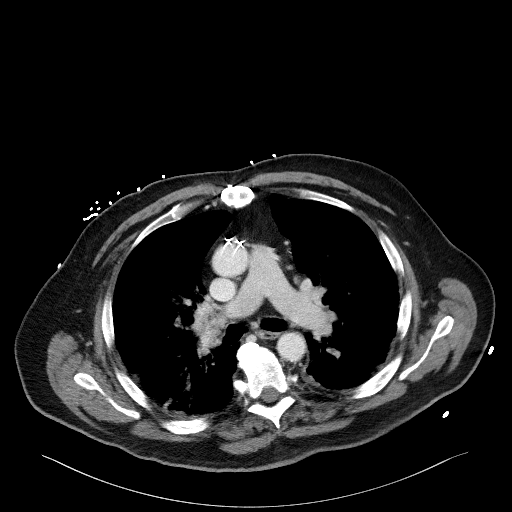
[im 113/124  mediastinal]
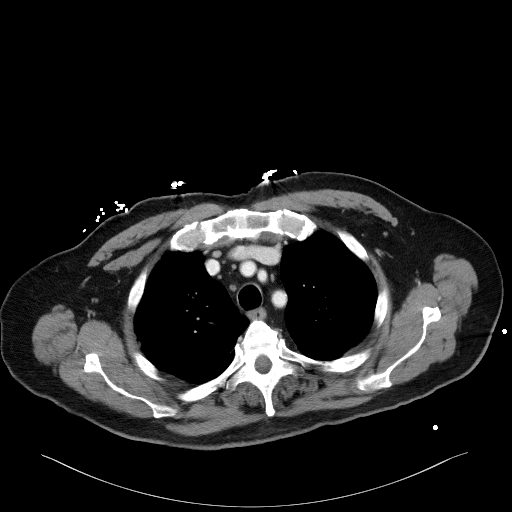

[Series 5: coronals · coronal · 0.84mm/px · 3 of 151 slices shown]
[im 31/151  mediastinal]
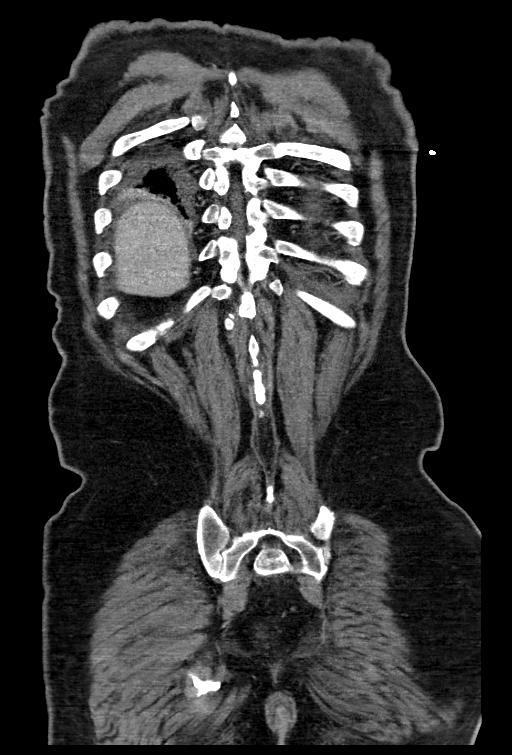
[im 61/151  mediastinal]
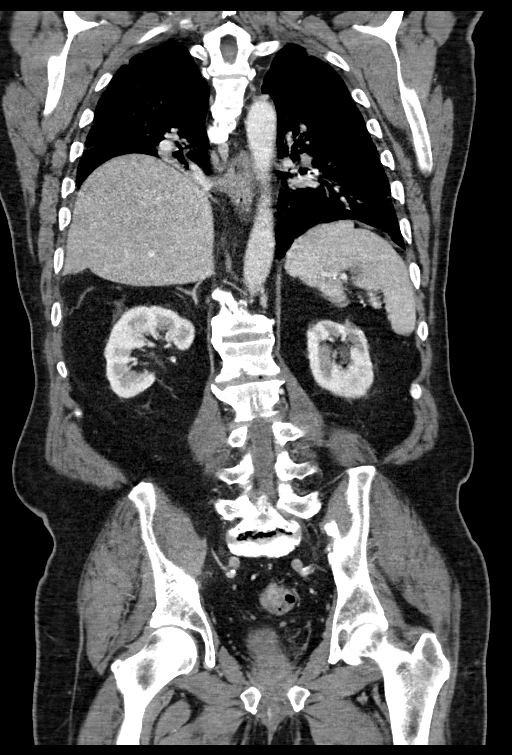
[im 91/151  mediastinal]
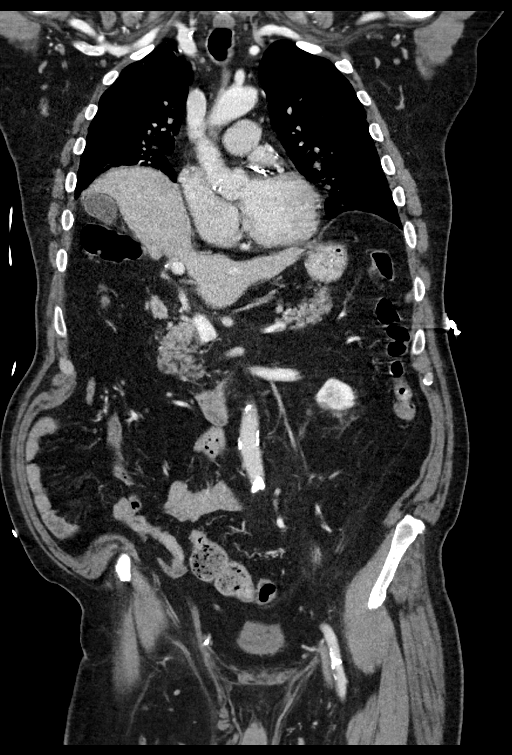

[14 of 36 positions shown; findings below may reference images not displayed]

RADIATION DOSE REDUCTION: This exam was performed according to the
departmental dose-optimization program which includes automated
exposure control, adjustment of the mA and/or kV according to
patient size and/or use of iterative reconstruction technique.

CONTRAST:  100mL OMNIPAQUE IOHEXOL 300 MG/ML  SOLN
FINDINGS: CT CHEST FINDINGS

Cardiovascular: Atherosclerosis of the thoracic aorta. No aortic
aneurysm. Dense calcification of native coronary arteries, post
CABG. The heart is normal in size. There is no pericardial effusion.

Mediastinum/Nodes: Small right infrahilar/paraesophageal lymph
nodes, not enlarged by size criteria. No enlarged mediastinal or
hilar lymph nodes. No esophageal wall thickening no thyroid nodule.

Lungs/Pleura: Multifocal patchy ground-glass and ill-defined
opacities throughout both lungs, pattern typical of COVID 19. Small
bilateral pleural effusions. Elevation of right hemidiaphragm with
associated volume loss in the right middle lobe. Trachea and central
bronchi are patent.

Musculoskeletal: Prior median sternotomy. Remote posterior ninth
through eleventh and lateral a 8 through eleventh rib fractures that
have healed. Diffuse thoracic spondylosis with flowing anterior
osteophytes. No acute osseous abnormalities.

CT ABDOMEN PELVIS FINDINGS

Hepatobiliary: Tiny subcentimeter hypodensity in the posterior right
hepatic lobe, series 2, image 38, too small to characterize. No
other liver lesion. Unremarkable gallbladder. No biliary dilatation.

Pancreas: Unremarkable. No pancreatic ductal dilatation or
surrounding inflammatory changes.

Spleen: Normal in size without focal abnormality. Splenule
anteriorly.

Adrenals/Urinary Tract: No adrenal nodule. No hydronephrosis. No
renal calculi or focal renal abnormality. Urinary bladder is only
minimally distended, mildly thick walled.

Stomach/Bowel: Decompressed stomach. No bowel obstruction or
inflammation. The appendix is normal.

Vascular/Lymphatic: Aortic atherosclerosis. No aortic aneurysm.
Patent portal, splenic, and mesenteric veins. No abdominopelvic
adenopathy.

Reproductive: Prostate is unremarkable.

Other: Multiple pelvic phleboliths. Minimal fat in the inguinal
canals. Tiny fat containing umbilical hernia. No pneumoperitoneum or
ascites.

Musculoskeletal: Bilateral L5 pars interarticularis defects with
grade 1 anterolisthesis of L5 on S1. Multilevel degenerative change
in the lumbar spine There are no acute or suspicious osseous
abnormalities.
IMPRESSION: 1. Multifocal patchy ground-glass and ill-defined opacities
throughout both lungs, pattern typical of COVID 19 pneumonia. Small
bilateral pleural effusions.
2. Mild urinary bladder wall thickening, may be due to nondistention
or can be seen with urinary tract infection. Recommend correlation
with urinalysis.
3. Bilateral L5 pars interarticularis defects with grade 1
anterolisthesis of L5 on S1.
4. Elevation of the right hemidiaphragm. This may be sequela of
remote injury, with multiple healed right rib fractures.

Aortic Atherosclerosis ([id number]).

## 2024-05-02 IMAGING — DX DG CHEST 2V
2 series · 2 of 2 positions shown · non-contrast
Comparison: Report from 11/05/2000

CLINICAL DATA: Cough and congestion for 1 week.  Dehydrated.

EXAM:
CHEST - 2 VIEW

[chest lat]
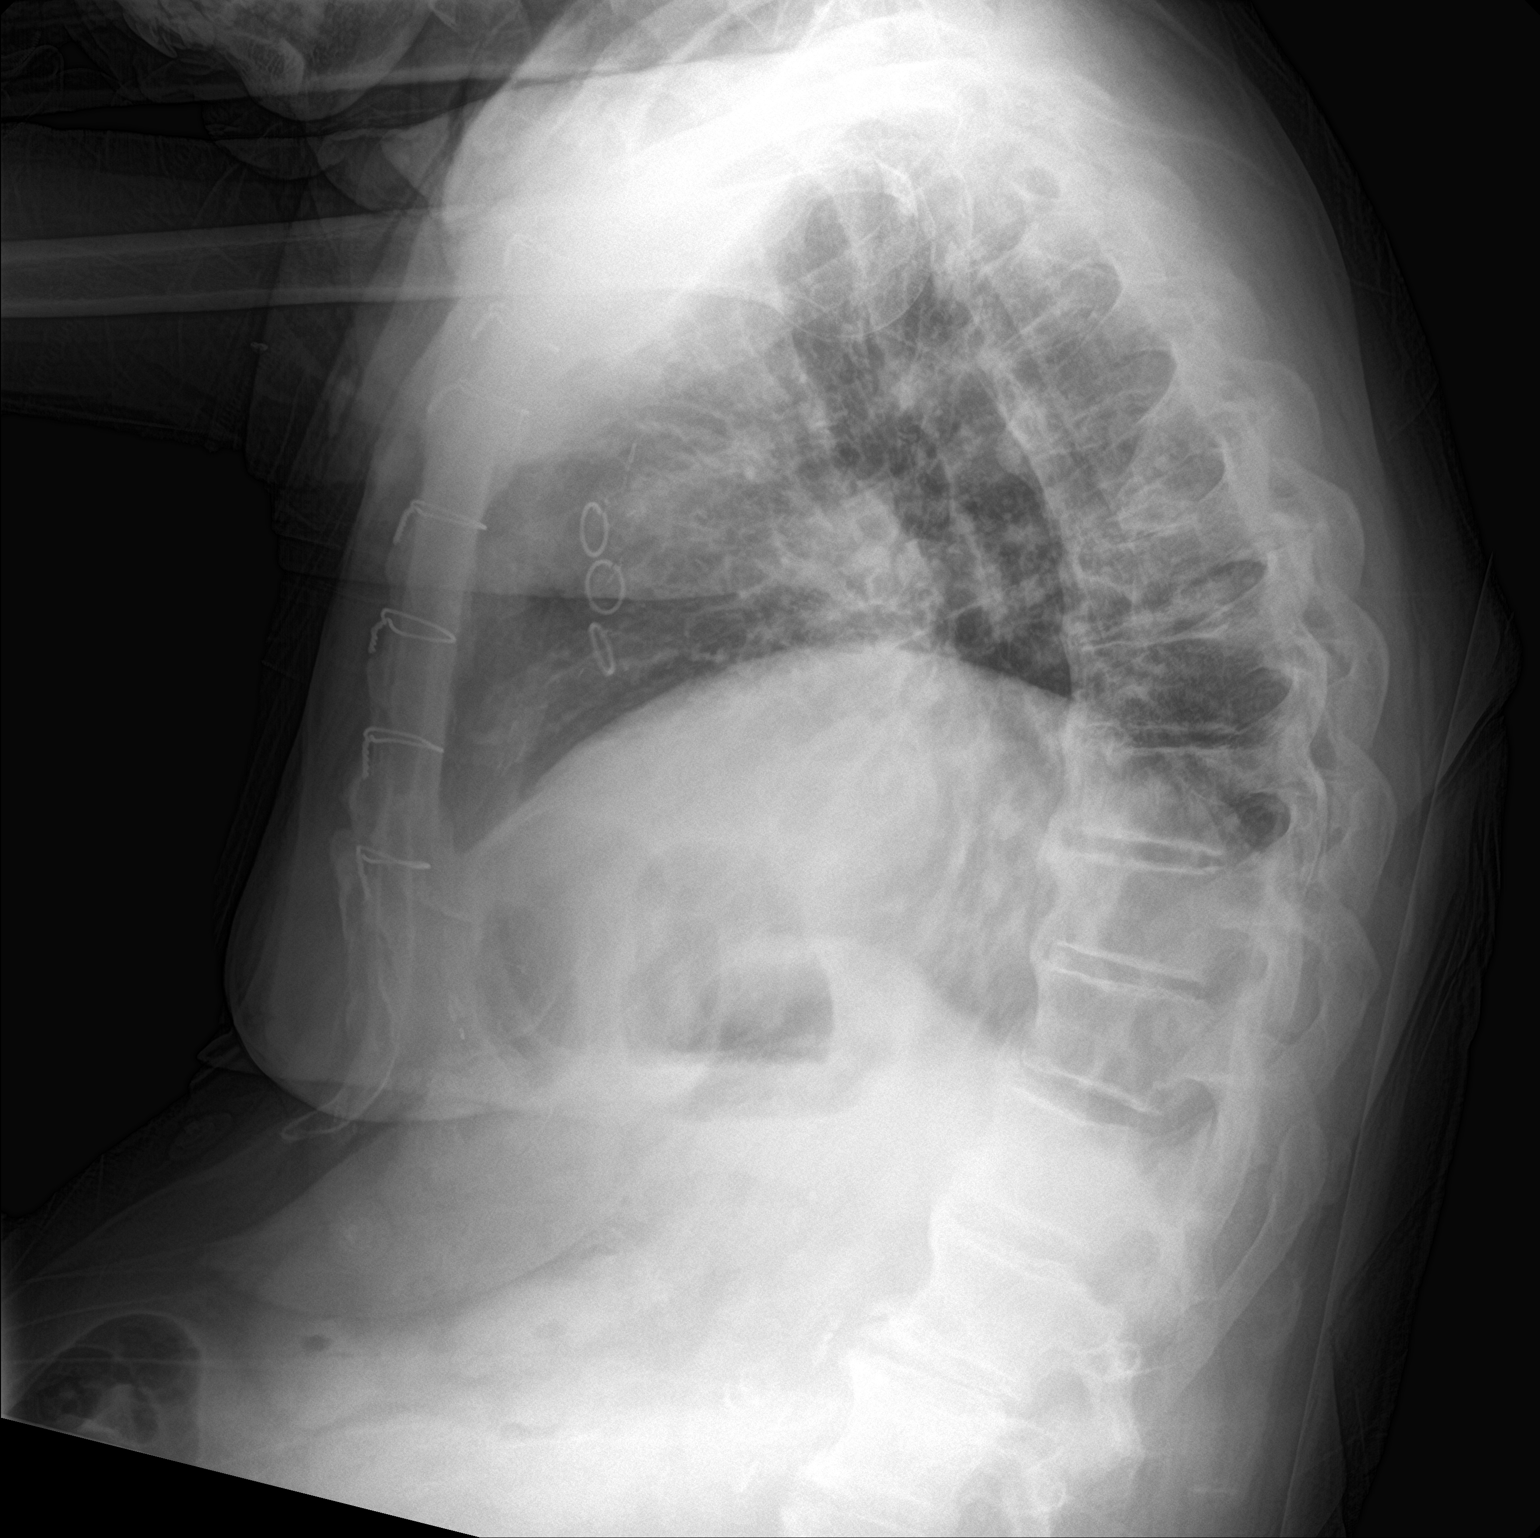

[chest ap]
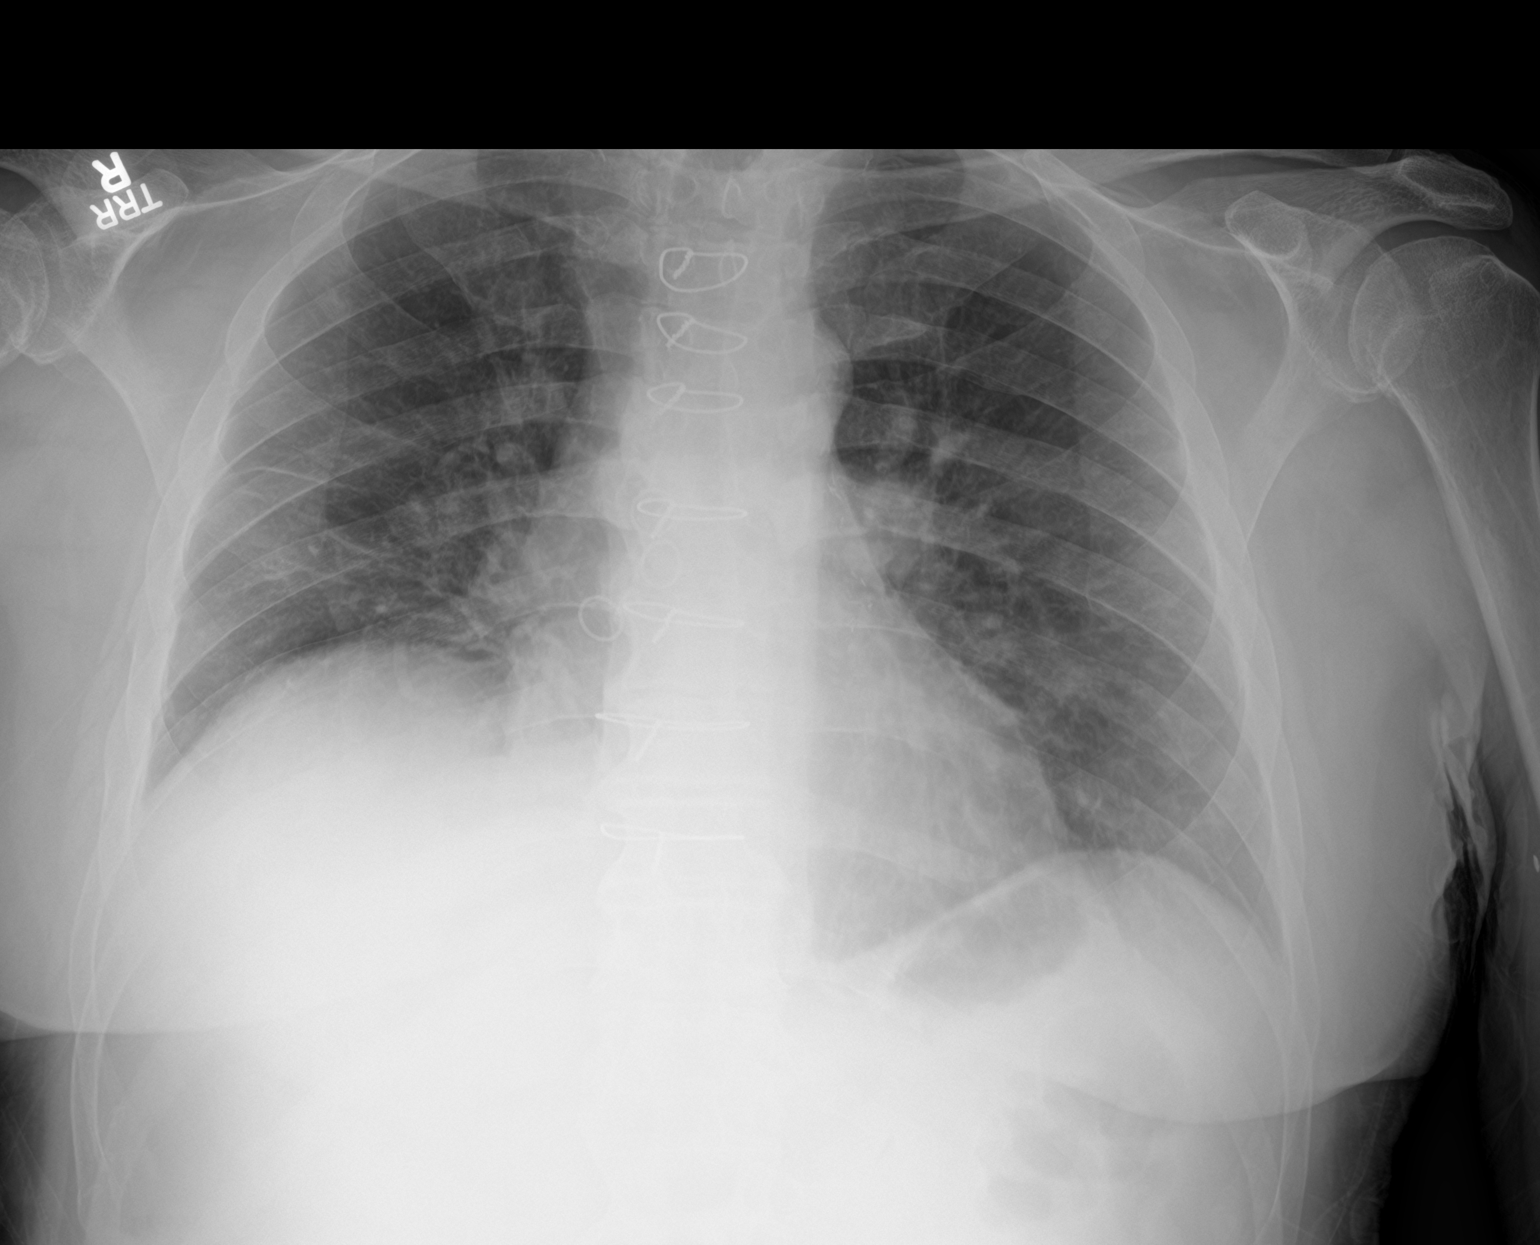

[2 of 2 positions shown; findings below may reference images not displayed]

FINDINGS: Median sternotomy for CABG. Midline trachea. Normal heart size.
Moderate right hemidiaphragm elevation. Mild pulmonary interstitial
thickening is likely related to the clinical history of remote
smoking. Right infrahilar volume loss secondary to diaphragmatic
elevation. Lateral view degraded by patient arm position.
IMPRESSION: Moderate right hemidiaphragm elevation. Resultant right infrahilar
volume loss.

No acute findings.
